# Patient Record
Sex: Male | Born: 1945
Health system: Southern US, Community
[De-identification: ages and names within clinical notes are randomized; demographics above are authoritative.]

## PROBLEM LIST (undated history)

## (undated) DIAGNOSIS — M1990 Unspecified osteoarthritis, unspecified site: Secondary | ICD-10-CM

## (undated) DIAGNOSIS — E1149 Type 2 diabetes mellitus with other diabetic neurological complication: Secondary | ICD-10-CM

## (undated) DIAGNOSIS — R931 Abnormal findings on diagnostic imaging of heart and coronary circulation: Secondary | ICD-10-CM

## (undated) DIAGNOSIS — Z8719 Personal history of other diseases of the digestive system: Secondary | ICD-10-CM

## (undated) DIAGNOSIS — T466X5A Adverse effect of antihyperlipidemic and antiarteriosclerotic drugs, initial encounter: Secondary | ICD-10-CM

## (undated) DIAGNOSIS — E119 Type 2 diabetes mellitus without complications: Secondary | ICD-10-CM

## (undated) DIAGNOSIS — T4145XA Adverse effect of unspecified anesthetic, initial encounter: Secondary | ICD-10-CM

## (undated) DIAGNOSIS — E7800 Pure hypercholesterolemia, unspecified: Secondary | ICD-10-CM

## (undated) DIAGNOSIS — M47812 Spondylosis without myelopathy or radiculopathy, cervical region: Secondary | ICD-10-CM

## (undated) DIAGNOSIS — E890 Postprocedural hypothyroidism: Secondary | ICD-10-CM

## (undated) DIAGNOSIS — M1711 Unilateral primary osteoarthritis, right knee: Secondary | ICD-10-CM

## (undated) DIAGNOSIS — T8859XA Other complications of anesthesia, initial encounter: Secondary | ICD-10-CM

## (undated) DIAGNOSIS — R0602 Shortness of breath: Secondary | ICD-10-CM

## (undated) DIAGNOSIS — R3912 Poor urinary stream: Secondary | ICD-10-CM

## (undated) DIAGNOSIS — R519 Headache, unspecified: Secondary | ICD-10-CM

## (undated) DIAGNOSIS — F418 Other specified anxiety disorders: Secondary | ICD-10-CM

## (undated) DIAGNOSIS — N3946 Mixed incontinence: Secondary | ICD-10-CM

## (undated) DIAGNOSIS — E349 Endocrine disorder, unspecified: Secondary | ICD-10-CM

## (undated) DIAGNOSIS — G7000 Myasthenia gravis without (acute) exacerbation: Secondary | ICD-10-CM

## (undated) DIAGNOSIS — I700 Atherosclerosis of aorta: Secondary | ICD-10-CM

## (undated) DIAGNOSIS — Z8546 Personal history of malignant neoplasm of prostate: Secondary | ICD-10-CM

## (undated) DIAGNOSIS — E785 Hyperlipidemia, unspecified: Secondary | ICD-10-CM

## (undated) DIAGNOSIS — F40240 Claustrophobia: Secondary | ICD-10-CM

## (undated) DIAGNOSIS — G4733 Obstructive sleep apnea (adult) (pediatric): Secondary | ICD-10-CM

## (undated) DIAGNOSIS — C61 Malignant neoplasm of prostate: Secondary | ICD-10-CM

## (undated) DIAGNOSIS — K219 Gastro-esophageal reflux disease without esophagitis: Secondary | ICD-10-CM

## (undated) DIAGNOSIS — Z789 Other specified health status: Secondary | ICD-10-CM

## (undated) DIAGNOSIS — I10 Essential (primary) hypertension: Secondary | ICD-10-CM

## (undated) DIAGNOSIS — Z8639 Personal history of other endocrine, nutritional and metabolic disease: Secondary | ICD-10-CM

## (undated) DIAGNOSIS — I2510 Atherosclerotic heart disease of native coronary artery without angina pectoris: Secondary | ICD-10-CM

## (undated) DIAGNOSIS — N320 Bladder-neck obstruction: Secondary | ICD-10-CM

## (undated) HISTORY — DX: Pure hypercholesterolemia, unspecified: E78.00

## (undated) HISTORY — DX: Spondylosis without myelopathy or radiculopathy, cervical region: M47.812

## (undated) HISTORY — DX: Other specified anxiety disorders: F41.8

## (undated) HISTORY — DX: Adverse effect of antihyperlipidemic and antiarteriosclerotic drugs, initial encounter: T46.6X5A

## (undated) HISTORY — DX: Type 2 diabetes mellitus with other diabetic neurological complication: E11.49

## (undated) HISTORY — DX: Mixed incontinence: N39.46

## (undated) HISTORY — DX: Abnormal findings on diagnostic imaging of heart and coronary circulation: R93.1

## (undated) HISTORY — DX: Atherosclerosis of aorta: I70.0

## (undated) HISTORY — DX: Endocrine disorder, unspecified: E34.9

## (undated) HISTORY — DX: Malignant neoplasm of prostate: C61

## (undated) HISTORY — DX: Atherosclerotic heart disease of native coronary artery without angina pectoris: I25.10

## (undated) HISTORY — DX: Unilateral primary osteoarthritis, right knee: M17.11

## (undated) HISTORY — DX: Other specified health status: Z78.9

---

## 1988-09-04 HISTORY — PX: HEMORRHOID SURGERY: SHX153

## 1997-11-04 ENCOUNTER — Ambulatory Visit (HOSPITAL_COMMUNITY): Admission: RE | Admit: 1997-11-04 | Discharge: 1997-11-04 | Payer: Self-pay | Admitting: General Surgery

## 1998-06-23 ENCOUNTER — Inpatient Hospital Stay (HOSPITAL_COMMUNITY): Admission: AD | Admit: 1998-06-23 | Discharge: 1998-06-26 | Payer: Self-pay | Admitting: Internal Medicine

## 1998-06-26 ENCOUNTER — Encounter: Payer: Self-pay | Admitting: Internal Medicine

## 1998-12-17 ENCOUNTER — Ambulatory Visit (HOSPITAL_COMMUNITY): Admission: RE | Admit: 1998-12-17 | Discharge: 1998-12-17 | Payer: Self-pay | Admitting: Gastroenterology

## 2000-08-12 ENCOUNTER — Encounter: Admission: RE | Admit: 2000-08-12 | Discharge: 2000-08-12 | Payer: Self-pay | Admitting: Family Medicine

## 2000-08-12 ENCOUNTER — Encounter: Payer: Self-pay | Admitting: Family Medicine

## 2003-02-05 ENCOUNTER — Encounter: Admission: RE | Admit: 2003-02-05 | Discharge: 2003-02-05 | Payer: Self-pay | Admitting: Gastroenterology

## 2003-02-13 ENCOUNTER — Ambulatory Visit (HOSPITAL_COMMUNITY): Admission: RE | Admit: 2003-02-13 | Discharge: 2003-02-13 | Payer: Self-pay | Admitting: Gastroenterology

## 2003-05-24 ENCOUNTER — Encounter (INDEPENDENT_AMBULATORY_CARE_PROVIDER_SITE_OTHER): Payer: Self-pay | Admitting: Specialist

## 2003-05-24 ENCOUNTER — Observation Stay (HOSPITAL_COMMUNITY): Admission: RE | Admit: 2003-05-24 | Discharge: 2003-05-25 | Payer: Self-pay | Admitting: Surgery

## 2003-05-24 HISTORY — PX: TOTAL THYROIDECTOMY: SHX2547

## 2005-06-24 ENCOUNTER — Emergency Department (HOSPITAL_COMMUNITY): Admission: EM | Admit: 2005-06-24 | Discharge: 2005-06-24 | Payer: Self-pay | Admitting: Emergency Medicine

## 2010-05-22 NOTE — Op Note (Signed)
NAME:  Mario Burns, Mario Burns                         ACCOUNT NO.:  [id number]   MEDICAL RECORD NO.:  [id number]                   PATIENT TYPE:  AMB   LOCATION:  ENDO                                 FACILITY:  [facility]   PHYSICIAN:  Petra Kuba, M.D.                 DATE OF BIRTH:  06-28-1945   DATE OF PROCEDURE:  02/13/2003  DATE OF DISCHARGE:                                 OPERATIVE REPORT   PROCEDURE:  Esophagogastroduodenoscopy.   INDICATIONS FOR PROCEDURE:  Increased upper tract symptoms, atypical chest  pain.  Consent was signed after risks, benefits, methods, and options were  thoroughly discussed in the office before any premeds given.   ADDITIONAL MEDICATIONS USED:  Demerol 10, Versed 2.   PROCEDURE:  The video endoscope was inserted by direct vision.  The proximal  and mid esophagus was normal.  In the distal esophagus there was a  moderately large hiatal hernia.  The scope passed easily into the stomach.  Some fluid was suctioned.  It was advanced through a normal antrum, normal  pylorus, into a normal duodenum bulb and around the C-loop to the second  portion of the duodenum.  No abnormalities were seen.  A good look back at  the bulb showed normal appearance.  The scope was withdrawn back in the  stomach and retroflexed high in the cardia, the hiatal hernia was confirmed.  The fungus, angularis, lesser and greater curve were all normal on retroflex  visualization. Straight visualization did not reveal any additional  findings.  The air was suctioned, the scope was slowly withdrawn.  Again, a  good look at the esophagus was normal.  The scope was removed.  The patient  tolerated the procedure well.  There was no obvious complications.   ENDOSCOPIC DIAGNOSIS:  1. Moderately large hiatal hernia.  2. Otherwise, normal EGD.   PLAN:  Go ahead and get a TSH and free T4 to evaluate his goiter.  Consider  fixing his hiatal hernia and possibly getting a barium swallow and  thyroidectomy as he is having dysphagia from his goiter.  I will be happy to  see back p.r.n. or in two months pending thyroid results.  May need surgical  consult versus endocrine consult.                                               Petra Kuba, M.D.    MEM/MEDQ  D:  02/13/2003  T:  02/13/2003  Job:  [id number]

## 2010-05-22 NOTE — Op Note (Signed)
NAME:  Mario Burns, Mario Burns                         ACCOUNT NO.:  [id number]   MEDICAL RECORD NO.:  [id number]                   PATIENT TYPE:  INP   LOCATION:  0347                                 FACILITY:  [facility]   PHYSICIAN:  Velora Heckler, M.D.                DATE OF BIRTH:  1946/01/02   DATE OF PROCEDURE:  05/24/2003  DATE OF DISCHARGE:                                 OPERATIVE REPORT   PREOPERATIVE DIAGNOSIS:  Multinodular goiter with compressive symptoms.   POSTOPERATIVE DIAGNOSIS:  Multinodular goiter with compressive symptoms.   PROCEDURE:  Total thyroidectomy.   SURGEON:  Velora Heckler, M.D.   ASSISTANT:  Lebron Conners, M.D.   ANESTHESIA:  General.   ESTIMATED BLOOD LOSS:  Less than 100 cc.   PREPARATION:  Betadine.   COMPLICATIONS:  None.   INDICATIONS:  The patient is a 65 year old white male from [place], [place]; presents with thyroid goiter.  He has developed dysphagia and  pressure of the upper chest.  Chest x-ray showed a significant goiter at the  thoracic outlet.  The patient was seen by Dr. Romeo Apple __________ at [place] at  [facility].  A CT scan of the chest demonstrated a very large thyroid  goiter with substernal extension.  He now comes to surgery for  thyroidectomy.   DESCRIPTION OF PROCEDURE:  The procedure is done in OR #11 at the [place] at  [facility].  The patient is brought to the operating room and  placed in a supine position on the operating room table.  Following the  administration of general anesthesia, the patient is prepped and draped in  the usual strict aseptic fashion.  After ascertaining an adequate level of  anesthesia had been obtained, a Kocher incision was made with a #10 blade.  Dissection was carried down through subcutaneous tissues and platysma.  External jugular veins are divided between hemostats and ligated with 2-0  silk ties.  Platysma is elevated cephalad and caudad from the thyroid notch  to the  sternal notch.  A May-Horner self-retaining retractor is placed for  exposure.  Strap muscles are incised in the midline and dissection is  carried down into the thyroid gland.  Dissection is begun on the left side  of the neck.  The left thyroid lobe was markedly enlarged; it extends  posteriorly to the cervical fascia.  It encircles the trachea and the  esophagus, and extends into the space behind the esophagus.  Middle thyroid  vein is divided between medium Ligaclips.  Inferior pole was mobilized and  venous structures are divided between hemostats and ligated with 2-0 silk  ties.  The superior pole was gently dissected out.  The superior pole  vessels are ligated in continuity with 2-0 silk ties and medium Ligaclips,  and divided.  The gland is rolled anteriorly.  The branches of the inferior  thyroid artery are divided between  small Ligaclips.  Parathyroid tissue is  identified and preserved.  The gland is rolled medially.  Ligament of Allyson Sabal  is transected and the gland is rolled up and onto the anterior trachea.  Dissection is moderately difficult due to the large size of the gland,  difficulty visualizing significant structures near the area of the recurrent  laryngeal nerve.  There is a small pyramidal lobe, which is resected with  the electrocautery.  Good hemostasis is noted and a pack is placed in the  left neck.   Next, we turned our attention to the right side.  Again, the right lobe was  markedly enlarged, although not quite to the degree seen on the left side.  Strap muscles are reflected laterally.  Middle thyroid vein is divided  between medium Ligaclips.  Inferior venous tributaries are divided between  hemostats and ligated with 2-0 silk ties.  The superior pole is somewhat  bifid.  The vessels are again ligated in continuity with 2-0 silk ties and  medium Ligaclips, then divided.  Pole is mobilized completely from the space  behind the hypopharynx.  Gland is gently  rolled medially.  Branches of the  inferior thyroid artery are divided between small Ligaclips.  There is a  large tubercle of Zucker-Kandel.  This is gently dissected out.  Parathyroid  tissue is preserved.  Dissection is continued on the surface of the thyroid  capsule.  The gland is rolled medially.  Further venous tributaries are  divided between small Ligaclips.  The ligament of Allyson Sabal is transected with  the electrocautery and the gland is rolled anteriorly.  The gland is then  excised off the anterior surface of the trachea with the electrocautery used  for hemostasis.  The entire thyroid is excised en bloc.  A suture is used to  mark the left superior pole.  Specimen is submitted to pathology for review.  The neck is irrigated with warm saline and hemostasis is obtained with the  electrocautery and small Ligaclips.  Surgicel is placed over the area of the  recurrent laryngeal nerves bilaterally.  Strap muscles were reapproximated  in the midline with interrupted 3-0 Vicryl sutures.  Platysma is closed with  interrupted 3-0 Vicryl sutures.  The skin is closed with a running 4-0  Vicryl subcuticular suture.  The wound is washed and dried and Benzoin and  Steri-Strips are applied.  Sterile dressings are applied.  The patient is  awakened from anesthesia and brought to the recovery room in stable  condition.  The patient tolerated the procedure well.                                               Velora Heckler, M.D.    TMG/MEDQ  D:  05/24/2003  T:  05/24/2003  Job:  [id number]   cc:   Dr. Wonda Cerise; [place] [facility], M.D.  [address]  [place]  [place] [address]  Fax: [phone]   Lianne Bushy, M.D.  [address]  [place]  [place] [address]  Fax: [phone]

## 2010-05-22 NOTE — Op Note (Signed)
NAME:  Mario Burns, KUNZ                         ACCOUNT NO.:  [id number]   MEDICAL RECORD NO.:  [id number]                   PATIENT TYPE:  AMB   LOCATION:  ENDO                                 FACILITY:  [facility]   PHYSICIAN:  Petra Kuba, M.D.                 DATE OF BIRTH:  [birth year]   DATE OF PROCEDURE:  02/13/2003  DATE OF DISCHARGE:                                 OPERATIVE REPORT   PROCEDURE:  Colonoscopy.   INDICATION:  Family history of colon cancer, personal history of colon  polyps, due for repeat screening.  Consent was signed after risks, benefits,  methods, and options thoroughly discussed in the office.   MEDICINES USED:  1. Demerol 50.  2. Versed 5.   DESCRIPTION OF PROCEDURE:  Rectal inspection was pertinent for external  hemorrhoids.  Digital exam was negative.  Video colonoscope was inserted,  easily advanced around the colon to the cecum.  This did require abdominal  pressure but no position changes.  No obvious abnormality was seen on  insertion.  The cecum was identified by the appendiceal orifice and the  ileocecal valve.  Prep was adequate.  There was some liquid stool that  required washing and suctioning.  On slow withdrawal through the colon, no  polyps, tumors, masses, or other abnormalities were seen as we slowly  withdrew back to the rectum.  Anorectal pull-through and retroflexion  confirmed some small hemorrhoids.  The scope was straightened and readvanced  a short ways up the left side of the colon; air was suctioned, scope  removed.  The patient tolerated the procedure well.  There was no obvious  immediate complication.   ENDOSCOPIC DIAGNOSES:  1. Internal-external hemorrhoids.  2. Otherwise, within normal limits to the cecum.   PLAN:  1. Yearly rectals and guaiacs per Dr. Purnell Shoemaker.  2. Continue work-up with an EGD.  3. Repeat colon screening in 5 years.                                               Petra Kuba, M.D.    MEM/MEDQ  D:   02/13/2003  T:  02/13/2003  Job:  [id number]   cc:   Lianne Bushy, M.D.  [address]  [place]  [place] [address]  Fax: [phone]

## 2010-07-01 ENCOUNTER — Other Ambulatory Visit: Payer: Self-pay | Admitting: Gastroenterology

## 2011-03-01 ENCOUNTER — Other Ambulatory Visit: Payer: Self-pay | Admitting: Neurology

## 2011-03-05 ENCOUNTER — Ambulatory Visit
Admission: RE | Admit: 2011-03-05 | Discharge: 2011-03-05 | Disposition: A | Discharge status: Home / Self Care | Payer: Medicare Other | Source: Ambulatory Visit | Attending: Neurology | Admitting: Neurology

## 2011-03-05 MED ORDER — IOHEXOL 300 MG/ML  SOLN
75.0000 mL | Freq: Once | INTRAMUSCULAR | Status: AC | PRN
  Administered 2011-03-05: 75 mL via INTRAVENOUS

## 2011-10-13 ENCOUNTER — Other Ambulatory Visit: Payer: Self-pay | Admitting: Family Medicine

## 2011-10-13 DIAGNOSIS — R109 Unspecified abdominal pain: Secondary | ICD-10-CM

## 2011-10-18 ENCOUNTER — Ambulatory Visit
Admission: RE | Admit: 2011-10-18 | Discharge: 2011-10-18 | Disposition: A | Discharge status: Home / Self Care | Payer: Medicare Other | Source: Ambulatory Visit | Attending: Family Medicine | Admitting: Family Medicine

## 2011-10-18 DIAGNOSIS — R109 Unspecified abdominal pain: Secondary | ICD-10-CM

## 2011-10-18 MED ORDER — IOHEXOL 300 MG/ML  SOLN
100.0000 mL | Freq: Once | INTRAMUSCULAR | Status: AC | PRN
  Administered 2011-10-18: 100 mL via INTRAVENOUS

## 2011-10-21 ENCOUNTER — Other Ambulatory Visit: Payer: Self-pay | Admitting: Urology

## 2011-12-08 ENCOUNTER — Encounter (HOSPITAL_COMMUNITY): Payer: Self-pay | Admitting: Pharmacy Technician

## 2011-12-10 ENCOUNTER — Encounter (HOSPITAL_COMMUNITY): Payer: Self-pay

## 2011-12-10 ENCOUNTER — Encounter (HOSPITAL_COMMUNITY)
Admission: RE | Admit: 2011-12-10 | Discharge: 2011-12-10 | Disposition: A | Discharge status: Home / Self Care | Payer: Medicare Other | Source: Ambulatory Visit | Attending: Urology | Admitting: Urology

## 2011-12-10 HISTORY — DX: Essential (primary) hypertension: I10

## 2011-12-10 HISTORY — DX: Gastro-esophageal reflux disease without esophagitis: K21.9

## 2011-12-10 HISTORY — DX: Unspecified osteoarthritis, unspecified site: M19.90

## 2011-12-10 HISTORY — DX: Personal history of other diseases of the digestive system: Z87.19

## 2011-12-10 HISTORY — DX: Shortness of breath: R06.02

## 2011-12-10 LAB — CBC
HCT: 43.4 % (ref 39.0–52.0)
Hemoglobin: 15.2 g/dL (ref 13.0–17.0)
MCHC: 35 g/dL (ref 30.0–36.0)

## 2011-12-10 LAB — BASIC METABOLIC PANEL
BUN: 14 mg/dL (ref 6–23)
CO2: 24 mEq/L (ref 19–32)
Calcium: 9.5 mg/dL (ref 8.4–10.5)
Creatinine, Ser: 0.68 mg/dL (ref 0.50–1.35)
GFR calc non Af Amer: >90 mL/min (ref >90)
Potassium: 4.1 mEq/L (ref 3.5–5.1)

## 2011-12-10 NOTE — Patient Instructions (Signed)
YOUR SURGERY IS SCHEDULED AT [facility]  ON:  Wednesday  12/11  REPORT TO [place] SHORT STAY CENTER AT: 6:30 AM      PHONE # FOR SHORT STAY IS [phone] FOLLOW BOWEL PREP INSTRUCTIONS GIVEN BY DR. Ellin Goodie OFFICE DAY BEFORE SURGERY.  DO NOT EAT OR DRINK ANYTHING AFTER MIDNIGHT THE NIGHT BEFORE YOUR SURGERY.  YOU MAY BRUSH YOUR TEETH, RINSE OUT YOUR MOUTH--BUT NO WATER, NO FOOD, NO CHEWING GUM, NO MINTS, NO CANDIES, NO CHEWING TOBACCO.  PLEASE TAKE THE FOLLOWING MEDICATIONS THE AM OF YOUR SURGERY WITH A FEW SIPS OF WATER:  LEVOTHYROXIN  IF YOU USE INHALERS--USE YOUR INHALERS THE AM OF YOUR SURGERY AND BRING INHALERS TO THE HOSPITAL -TAKE TO SURGERY.    IF YOU ARE DIABETIC:  DO NOT TAKE ANY DIABETIC MEDICATIONS THE AM OF YOUR SURGERY.  IF YOU TAKE INSULIN IN THE EVENINGS--PLEASE ONLY TAKE 1/2 NORMAL EVENING DOSE THE NIGHT BEFORE YOUR SURGERY.  NO INSULIN THE AM OF YOUR SURGERY.  IF YOU HAVE SLEEP APNEA AND USE CPAP OR BIPAP--PLEASE BRING THE MASK AND THE TUBING.  DO NOT BRING YOUR MACHINE.  DO NOT BRING VALUABLES, MONEY, CREDIT CARDS.  DO NOT WEAR JEWELRY, MAKE-UP, NAIL POLISH AND NO METAL PINS OR CLIPS IN YOUR HAIR. CONTACT LENS, DENTURES / PARTIALS, GLASSES SHOULD NOT BE WORN TO SURGERY AND IN MOST CASES-HEARING AIDS WILL NEED TO BE REMOVED.  BRING YOUR GLASSES CASE, ANY EQUIPMENT NEEDED FOR YOUR CONTACT LENS. FOR PATIENTS ADMITTED TO THE HOSPITAL--CHECK OUT TIME THE DAY OF DISCHARGE IS 11:00 AM.  ALL INPATIENT ROOMS ARE PRIVATE - WITH BATHROOM, TELEPHONE, TELEVISION AND WIFI INTERNET.  IF YOU ARE BEING DISCHARGED THE SAME DAY OF YOUR SURGERY--YOU CAN NOT DRIVE YOURSELF HOME--AND SHOULD NOT GO HOME ALONE BY TAXI OR BUS.  NO DRIVING OR OPERATING MACHINERY FOR 24 HOURS FOLLOWING ANESTHESIA / PAIN MEDICATIONS.  PLEASE MAKE ARRANGEMENTS FOR SOMEONE TO BE WITH YOU AT HOME THE FIRST 24 HOURS AFTER SURGERY. RESPONSIBLE DRIVER'S NAME___________________________                       PHONE #   _______________________                               PLEASE READ OVER ANY  FACT SHEETS THAT YOU WERE GIVEN: MRSA INFORMATION, BLOOD TRANSFUSION INFORMATION, INCENTIVE SPIROMETER INFORMATION. FAILURE TO FOLLOW THESE INSTRUCTIONS MAY RESULT IN THE CANCELLATION OF YOUR SURGERY.   PATIENT SIGNATURE_________________________________

## 2011-12-10 NOTE — Pre-Procedure Instructions (Signed)
PREOP CBC, BMET, EKG WERE DONE TODAY- PREOP AT [facility] AS PER ANESTHESIOLOGIST'S GUIDELINES.  PT WANTS T/S DRAWN THE DAY OF HIS SURGERY.  PT HAS CHEST CT REPORT IN EPIC FROM 03/05/11. DR. Renold Don NOTIFIED PT HAS MYASTHENIA GRAVIS DX IN FEB 2013--IS NOT ON ANY MEDICATIONS FOR MG AND HAS NOT SEEN NEUROLOGIST SINCE FEB 2013 -HIS CHEWING AND SWALLOWING PROBLEMS HAVE RETURNED-THOUGH NOT AS BAD AS PREVIOUS.   DR. Renold Don STATES PT NEEDS TO SEE NEUROLOGIST FOR CLEARANCE BEFORE SURGERY.  SELITA AT DR. Ellin Goodie OFFICE NOTIFIED -MESSAGE LEFT ON HER ANSWERING MACHINE.

## 2011-12-13 NOTE — Pre-Procedure Instructions (Signed)
DR. Ellin Goodie OFFICE FAXED SURGICAL CLEARANCE FROM DR. KAnne Hahn AND OFFICE NOTE 12/10/11. CLEARANCE AND NOTE PLACED ON PT'S CHART.

## 2011-12-14 NOTE — H&P (Signed)
Reason For Visit      Patient is here today for robotic-assisted laparoscopic radical retropubic prostatectomy. He will be admitted hopefully for routine postoperative care.  History of Present Illness         Referred from Dr. Burnell Blanks for an elevated PSA of 4.3-4.7 range. These were drawn in July of 2013. PSA 2 was 10.5%. PSA in February of 2012 was 3.97.  Patient does have apparently mild documented hypogonadism.    Mario Burns  has had some voiding problems since his early forties. He was under the care of a urologist in [place] but has not been there for 20-25 years.  Currently his AUA symptom score is 32 with a bothersome score of 5.  He has a considerable obstructive as well as irritative symptoms.  The patient does have a history of prior urinary retention after  surgery.  He does have a significant family history of prostate cancer with a father who had metastatic prostate cancer and also an uncle with that disease.    Mario Burns underwent transrectal ultrasound of the prostate with biopsy in September of 2013. Patient's prostate volume was felt to be approximately 41 cubic centimeters. All left-sided biopsies were negative. On the right side all 6 cores were positive for a Gleason 3+3 equals 6 adenocarcinoma. Core involvement ranged from 10-50%.  He has not tried the Rapaflo samples.  The patient had a recent CT of the abdomen and pelvis with and without contrast ordered by his primary care physician due to some nonspecific abdominal pain. No concerning findings were noted. Rapaflo has not helped his voiding at all. AUA score 32/ 5. SHIM 5/25.    Past Medical History Problems  1. History of  Arthritis V13.4 2. History of  Diabetes Mellitus 250.00 3. History of  Gastric Ulcer 531.90 4. History of  Heartburn 787.1 5. History of  Hypercholesterolemia 272.0 6. History of  Hypertension 401.9 7. History of  Sleep Apnea 780.57  Surgical History Problems  1. History of  Hernia  Repair 2. History of  Thyroid Surgery Total Thyroidectomy  Current Meds 1. Benicar 20 MG Oral Tablet; Therapy: (Recorded:23Aug2013) to 2. Glimepiride 4 MG Oral Tablet; Therapy: 18Mar2013 to 3. Levofloxacin 500 MG Oral Tablet; 1 po q day beginning the day prior to biopsy; Therapy:  23Aug2013 to (Last Rx:23Aug2013) 4. Levothyroxine Sodium 125 MCG Oral Tablet; Therapy: 22Mar2013 to 5. PriLOSEC 20 MG Oral Capsule Delayed Release; Therapy: (Recorded:23Aug2013) to 6. Rapaflo 8 MG Oral Capsule; TAKE 1 CAPSULE Daily; Therapy: 23Aug2013 to  (Evaluate:21Dec2013)  Requested for: 23Aug2013; Last Rx:23Aug2013  Allergies Medication  1. Sulfa Drugs  Family History Problems  1. Paternal history of  Death In The Family Father 37yrs, PCA 2. Maternal history of  Family Health Status - Mother's Age 18yrs 3. Family history of  Family Health Status Number Of Children 1 son 4. Fraternal history of  Kidney Cancer V16.51 5. Paternal history of  Prostate Cancer V16.42 6. Paternal uncle's history of  Prostate Cancer V16.42  Social History Problems  1. Caffeine Use 1-2 qd 2. Marital History - Currently Married 3. Never A Smoker 4. Occupation: self-employed Denied  5. History of  Alcohol Use 6. History of  Tobacco Use  Review of Systems Genitourinary, constitutional, skin, eye, otolaryngeal, hematologic/lymphatic, cardiovascular, pulmonary, endocrine, musculoskeletal, gastrointestinal, neurological and psychiatric system(s) were reviewed and pertinent findings if present are noted.  Genitourinary: urinary frequency, feelings of urinary urgency, dysuria, nocturia, incontinence, difficulty starting the urinary stream, weak urinary stream, incomplete  emptying of bladder and erectile dysfunction, but no hematuria.  Gastrointestinal: abdominal pain and heartburn.  Constitutional: feeling tired (fatigue).  Eyes: blurred vision.  Cardiovascular: leg swelling.  Respiratory: shortness of breath and cough.   Endocrine: polydipsia.  Musculoskeletal: back pain and joint pain.  Neurological: headache and dizziness.  Psychiatric: anxiety.    Vitals Blood Pressure: 117 / 75 Temperature: 97.7 F Heart Rate: 79  Physical Exam Constitutional: Well nourished and well developed . No acute distress.  ENT:. The ears and nose are normal in appearance.  Neck: The appearance of the neck is normal and no neck mass is present.  Pulmonary: No respiratory distress and normal respiratory rhythm and effort.  Cardiovascular: Heart rate and rhythm are normal . No peripheral edema.  Abdomen: The abdomen is soft and nontender. No masses are palpated. No CVA tenderness. No hernias are palpable. No hepatosplenomegaly noted.  Rectal: Rectal exam demonstrates normal sphincter tone, no tenderness and no masses. Estimated prostate size is 2+. The prostate has no nodularity, is indurated involving the right, apex, mid aspect of the prostate which appears to be confined within the prostate capsule and is not tender. The left seminal vesicle is nonpalpable. The right seminal vesicle is nonpalpable. The perineum is normal on inspection.  Genitourinary: Examination of the penis demonstrates no discharge, no masses, no lesions and a normal meatus. The scrotum is without lesions. The right epididymis is palpably normal and non-tender. The left epididymis is palpably normal and non-tender. The right testis is non-tender and without masses. The left testis is non-tender and without masses.  Lymphatics: The femoral and inguinal nodes are not enlarged or tender.  Skin: Normal skin turgor, no visible rash and no visible skin lesions.  Neuro/Psych:. Mood and affect are appropriate.    Assessment Assessed  1. Benign Prostatic Hypertrophy With Urinary Obstruction 600.01 2. Prostate Cancer 185  Plan Prostate Cancer (185)  1. Follow-up Schedule Surgery Office  Follow-up  Requested for: 15Oct2013  Discussion/Summary  The patient was  counseled about the natural history of prostate cancer and the standard treatment options that are available for prostate cancer. It was explained to him how his age and life expectancy, clinical stage, Gleason score, and PSA affect his prognosis, the decision to proceed with additional staging studies, as well as how that information influences recommended treatment strategies. We discussed the roles for active surveillance, radiation therapy, surgical therapy, androgen deprivation, as well as ablative therapy options for the treatment of prostate cancer as appropriate to his individual cancer situation. We discussed the risks and benefits of these options with regard to their impact on cancer control and also in terms of potential adverse events, complications, and impact on quiality of life particularly related to urinary, bowel, and sexual function. The patient was encouraged to ask questions throughout the discussion today and all questions were answered to his stated satisfaction. In addition, the patient was provided with and/or directed to appropriate resources and literature for further education about prostate cancer and treatment options.   We discussed surgical therapy for prostate cancer including the different available surgical approaches. We discussed, in detail, the risks and expectations of surgery with regard to cancer control, urinary control, and erectile function as well as the expected postoperative recovery process. The risks, potential complications/adverse events of radical prostatectomy as well as alternative options were explained to the patient.   We discussed surgical therapy for prostate cancer including the different available surgical approaches. We discussed, in detail, the risks and  expectations of surgery with regard to cancer control, urinary control, and erectile function as well as the expected postoperative recovery process. Additional risks of surgery including but not  limited to bleeding, infection, hernia formation, nerve damage, lymphocele formation, bowel/rectal injury potentially necessitating colostomy, damage to the urinary tract resulting in urine leakage, urethral stricture, and the cardiopulmonary risks such as myocardial infarction, stroke, death, venothromboembolism, etc. were explained. The risk of open surgical conversion for robotic/laparoscopic prostatectomy was also discussed.   45 minutes were spent in face to face consultation with patient today.    Amendment  Mario Burns has a lower risk clinical stage T1c prostate cancer. PSA is well less than 10 and fortunately he has Gleason 6 disease. On the other hand, the volume of cancer is certainly clinically significant with all biopsies on the right side positive. I definitely feel that he should have active intervention, given this. He really has severe obstructive and irritative voiding symptoms, and therefore, I feel he is a poor candidate for radiation therapy, especially seed implantation. It is certainly possible that he would get through external beam but would be quite concerned that his voiding would worsen dramatically. The patient also has fairly severe pretreatment erectile dysfunction. For those reasons, I do think that surgery would be his best option. We would have the advantage of pathologic staging along with hopefully, improvement in his voiding status if indeed there is a significant obstructive component and certainly no worsening of bladder overactivity like we might see with radiation therapy. We talked in detail about the pros and cons of surgery and potential side effects. He is interested in proceeding with that. He understands that I have my next opening in the 6-8 week time frame.

## 2011-12-15 ENCOUNTER — Encounter (HOSPITAL_COMMUNITY)
Admission: RE | Disposition: A | Discharge status: Home / Self Care | Payer: Self-pay | Source: Ambulatory Visit | Attending: Urology

## 2011-12-15 ENCOUNTER — Encounter (HOSPITAL_COMMUNITY): Payer: Self-pay | Admitting: Anesthesiology

## 2011-12-15 ENCOUNTER — Observation Stay (HOSPITAL_COMMUNITY)
Admission: RE | Admit: 2011-12-15 | Discharge: 2011-12-17 | Disposition: A | Discharge status: Home / Self Care | Payer: Medicare Other | Source: Ambulatory Visit | Attending: Urology | Admitting: Urology

## 2011-12-15 ENCOUNTER — Encounter (HOSPITAL_COMMUNITY): Payer: Self-pay | Admitting: *Deleted

## 2011-12-15 ENCOUNTER — Ambulatory Visit (HOSPITAL_COMMUNITY): Payer: Medicare Other | Admitting: Anesthesiology

## 2011-12-15 DIAGNOSIS — N401 Enlarged prostate with lower urinary tract symptoms: Secondary | ICD-10-CM | POA: Insufficient documentation

## 2011-12-15 DIAGNOSIS — I10 Essential (primary) hypertension: Secondary | ICD-10-CM | POA: Insufficient documentation

## 2011-12-15 DIAGNOSIS — Z01810 Encounter for preprocedural cardiovascular examination: Secondary | ICD-10-CM | POA: Insufficient documentation

## 2011-12-15 DIAGNOSIS — E119 Type 2 diabetes mellitus without complications: Secondary | ICD-10-CM

## 2011-12-15 DIAGNOSIS — C61 Malignant neoplasm of prostate: Principal | ICD-10-CM | POA: Insufficient documentation

## 2011-12-15 DIAGNOSIS — N138 Other obstructive and reflux uropathy: Secondary | ICD-10-CM | POA: Insufficient documentation

## 2011-12-15 DIAGNOSIS — E7800 Pure hypercholesterolemia, unspecified: Secondary | ICD-10-CM | POA: Insufficient documentation

## 2011-12-15 DIAGNOSIS — Z79899 Other long term (current) drug therapy: Secondary | ICD-10-CM | POA: Insufficient documentation

## 2011-12-15 DIAGNOSIS — G4730 Sleep apnea, unspecified: Secondary | ICD-10-CM | POA: Insufficient documentation

## 2011-12-15 HISTORY — PX: ROBOT ASSISTED LAPAROSCOPIC RADICAL PROSTATECTOMY: SHX5141

## 2011-12-15 LAB — GLUCOSE, CAPILLARY: Glucose-Capillary: 186 mg/dL — ABNORMAL HIGH (ref 70–99)

## 2011-12-15 LAB — HEMOGLOBIN AND HEMATOCRIT, BLOOD
HCT: 39.1 % (ref 39.0–52.0)
Hemoglobin: 13.6 g/dL (ref 13.0–17.0)

## 2011-12-15 LAB — TYPE AND SCREEN: ABO/RH(D): A POS

## 2011-12-15 LAB — ABO/RH: ABO/RH(D): A POS

## 2011-12-15 SURGERY — ROBOTIC ASSISTED LAPAROSCOPIC RADICAL PROSTATECTOMY
Anesthesia: General | Wound class: Clean Contaminated

## 2011-12-15 MED ORDER — PANTOPRAZOLE SODIUM 40 MG PO TBEC
40.0000 mg | DELAYED_RELEASE_TABLET | Freq: Every day | ORAL | Status: DC
  Administered 2011-12-15 – 2011-12-17 (×3): 40 mg via ORAL
  Filled 2011-12-15 (×4): qty 1

## 2011-12-15 MED ORDER — HYDROCODONE-ACETAMINOPHEN 5-325 MG PO TABS: 1.0000 | ORAL_TABLET | Freq: Four times a day (QID) | ORAL | Status: DC | PRN

## 2011-12-15 MED ORDER — SODIUM CHLORIDE 0.9 % IV SOLN
INTRAVENOUS | Status: AC
  Filled 2011-12-15: qty 1.5

## 2011-12-15 MED ORDER — ACETAMINOPHEN 10 MG/ML IV SOLN
1000.0000 mg | Freq: Four times a day (QID) | INTRAVENOUS | Status: AC
  Administered 2011-12-15 – 2011-12-16 (×3): 1000 mg via INTRAVENOUS
  Filled 2011-12-15 (×4): qty 100

## 2011-12-15 MED ORDER — GLYCOPYRROLATE 0.2 MG/ML IJ SOLN
INTRAMUSCULAR | Status: DC | PRN
  Administered 2011-12-15: 0.6 mg via INTRAVENOUS

## 2011-12-15 MED ORDER — HEPARIN SODIUM (PORCINE) 1000 UNIT/ML IJ SOLN
INTRAMUSCULAR | Status: AC
  Filled 2011-12-15: qty 1

## 2011-12-15 MED ORDER — HYDROMORPHONE HCL PF 1 MG/ML IJ SOLN
0.2500 mg | INTRAMUSCULAR | Status: DC | PRN
  Administered 2011-12-15 (×2): 0.5 mg via INTRAVENOUS

## 2011-12-15 MED ORDER — LEVOTHYROXINE SODIUM 125 MCG PO TABS
125.0000 ug | ORAL_TABLET | Freq: Every day | ORAL | Status: DC
  Administered 2011-12-16 – 2011-12-17 (×2): 125 ug via ORAL
  Filled 2011-12-15 (×3): qty 1

## 2011-12-15 MED ORDER — EPHEDRINE SULFATE 50 MG/ML IJ SOLN
INTRAMUSCULAR | Status: DC | PRN
  Administered 2011-12-15 (×5): 10 mg via INTRAVENOUS

## 2011-12-15 MED ORDER — INSULIN ASPART 100 UNIT/ML ~~LOC~~ SOLN
0.0000 [IU] | Freq: Every day | SUBCUTANEOUS | Status: DC
  Administered 2011-12-15: 3 [IU] via SUBCUTANEOUS

## 2011-12-15 MED ORDER — LIDOCAINE HCL (CARDIAC) 20 MG/ML IV SOLN
INTRAVENOUS | Status: DC | PRN
  Administered 2011-12-15: 100 mg via INTRAVENOUS

## 2011-12-15 MED ORDER — GLIMEPIRIDE 4 MG PO TABS
4.0000 mg | ORAL_TABLET | Freq: Two times a day (BID) | ORAL | Status: DC
  Administered 2011-12-15 – 2011-12-17 (×4): 4 mg via ORAL
  Filled 2011-12-15 (×6): qty 1

## 2011-12-15 MED ORDER — STERILE WATER FOR IRRIGATION IR SOLN
Status: DC | PRN
  Administered 2011-12-15: 3000 mL

## 2011-12-15 MED ORDER — CIPROFLOXACIN HCL 500 MG PO TABS: 500.0000 mg | ORAL_TABLET | Freq: Two times a day (BID) | ORAL | Status: DC

## 2011-12-15 MED ORDER — SUCCINYLCHOLINE CHLORIDE 20 MG/ML IJ SOLN
INTRAMUSCULAR | Status: DC | PRN
  Administered 2011-12-15: 100 mg via INTRAVENOUS

## 2011-12-15 MED ORDER — HYDROCODONE-ACETAMINOPHEN 5-325 MG PO TABS
1.0000 | ORAL_TABLET | ORAL | Status: DC | PRN
  Administered 2011-12-16: 2 via ORAL
  Administered 2011-12-16: 1 via ORAL
  Administered 2011-12-16 (×2): 2 via ORAL
  Administered 2011-12-17 (×4): 1 via ORAL
  Filled 2011-12-15 (×2): qty 2
  Filled 2011-12-15 (×3): qty 1
  Filled 2011-12-15 (×2): qty 2
  Filled 2011-12-15 (×2): qty 1

## 2011-12-15 MED ORDER — HYDROMORPHONE HCL PF 1 MG/ML IJ SOLN
INTRAMUSCULAR | Status: AC
  Filled 2011-12-15: qty 1

## 2011-12-15 MED ORDER — BUPIVACAINE-EPINEPHRINE PF 0.25-1:200000 % IJ SOLN
INTRAMUSCULAR | Status: AC
  Filled 2011-12-15: qty 30

## 2011-12-15 MED ORDER — LACTATED RINGERS IV SOLN: INTRAVENOUS | Status: DC

## 2011-12-15 MED ORDER — ACETAMINOPHEN 10 MG/ML IV SOLN
INTRAVENOUS | Status: AC
Start: 2011-12-15 — End: 2011-12-15
  Filled 2011-12-15: qty 100

## 2011-12-15 MED ORDER — SODIUM CHLORIDE 0.9 % IV BOLUS (SEPSIS)
1000.0000 mL | Freq: Once | INTRAVENOUS | Status: AC
  Administered 2011-12-15: 1000 mL via INTRAVENOUS

## 2011-12-15 MED ORDER — MIDAZOLAM HCL 5 MG/5ML IJ SOLN
INTRAMUSCULAR | Status: DC | PRN
  Administered 2011-12-15: 2 mg via INTRAVENOUS

## 2011-12-15 MED ORDER — ONDANSETRON HCL 4 MG/2ML IJ SOLN
4.0000 mg | INTRAMUSCULAR | Status: DC | PRN
  Administered 2011-12-16: 4 mg via INTRAVENOUS
  Filled 2011-12-15 (×2): qty 2

## 2011-12-15 MED ORDER — MEPERIDINE HCL 50 MG/ML IJ SOLN: 6.2500 mg | INTRAMUSCULAR | Status: DC | PRN

## 2011-12-15 MED ORDER — ACETAMINOPHEN 10 MG/ML IV SOLN
INTRAVENOUS | Status: DC | PRN
  Administered 2011-12-15: 1000 mg via INTRAVENOUS

## 2011-12-15 MED ORDER — OXYCODONE HCL 5 MG/5ML PO SOLN
5.0000 mg | Freq: Once | ORAL | Status: DC | PRN
  Filled 2011-12-15: qty 5

## 2011-12-15 MED ORDER — INSULIN ASPART 100 UNIT/ML ~~LOC~~ SOLN
0.0000 [IU] | Freq: Three times a day (TID) | SUBCUTANEOUS | Status: DC
  Administered 2011-12-15: 8 [IU] via SUBCUTANEOUS
  Administered 2011-12-16 (×3): 3 [IU] via SUBCUTANEOUS
  Administered 2011-12-17: 5 [IU] via SUBCUTANEOUS

## 2011-12-15 MED ORDER — ACETAMINOPHEN 10 MG/ML IV SOLN: 1000.0000 mg | Freq: Once | INTRAVENOUS | Status: DC | PRN

## 2011-12-15 MED ORDER — LACTATED RINGERS IV SOLN
INTRAVENOUS | Status: DC | PRN
  Administered 2011-12-15 (×3): via INTRAVENOUS

## 2011-12-15 MED ORDER — CISATRACURIUM BESYLATE (PF) 10 MG/5ML IV SOLN
INTRAVENOUS | Status: DC | PRN
  Administered 2011-12-15: 2 mg via INTRAVENOUS
  Administered 2011-12-15: 6 mg via INTRAVENOUS
  Administered 2011-12-15: 10 mg via INTRAVENOUS
  Administered 2011-12-15: 4 mg via INTRAVENOUS

## 2011-12-15 MED ORDER — NEOSTIGMINE METHYLSULFATE 1 MG/ML IJ SOLN
INTRAMUSCULAR | Status: DC | PRN
  Administered 2011-12-15: 4 mg via INTRAVENOUS

## 2011-12-15 MED ORDER — SODIUM CHLORIDE 0.9 % IV SOLN
1.5000 g | INTRAVENOUS | Status: AC
  Administered 2011-12-15: 1.5 g via INTRAVENOUS

## 2011-12-15 MED ORDER — SODIUM CHLORIDE 0.9 % IR SOLN
Status: DC | PRN
  Administered 2011-12-15: 1000 mL via INTRAVESICAL

## 2011-12-15 MED ORDER — PROMETHAZINE HCL 25 MG/ML IJ SOLN: 6.2500 mg | INTRAMUSCULAR | Status: DC | PRN

## 2011-12-15 MED ORDER — LACTATED RINGERS IV SOLN
INTRAVENOUS | Status: DC | PRN
  Administered 2011-12-15: 10:00:00

## 2011-12-15 MED ORDER — SODIUM CHLORIDE 0.45 % IV SOLN
INTRAVENOUS | Status: DC
  Administered 2011-12-15 (×2): via INTRAVENOUS

## 2011-12-15 MED ORDER — SUFENTANIL CITRATE 50 MCG/ML IV SOLN
INTRAVENOUS | Status: DC | PRN
  Administered 2011-12-15: 10 ug via INTRAVENOUS
  Administered 2011-12-15: 5 ug via INTRAVENOUS
  Administered 2011-12-15 (×2): 10 ug via INTRAVENOUS
  Administered 2011-12-15: 5 ug via INTRAVENOUS
  Administered 2011-12-15: 10 ug via INTRAVENOUS

## 2011-12-15 MED ORDER — IRBESARTAN 150 MG PO TABS
150.0000 mg | ORAL_TABLET | Freq: Every day | ORAL | Status: DC
  Administered 2011-12-15 – 2011-12-17 (×3): 150 mg via ORAL
  Filled 2011-12-15 (×3): qty 1

## 2011-12-15 MED ORDER — INDIGOTINDISULFONATE SODIUM 8 MG/ML IJ SOLN
INTRAMUSCULAR | Status: AC
  Filled 2011-12-15: qty 10

## 2011-12-15 MED ORDER — PROPOFOL 10 MG/ML IV BOLUS
INTRAVENOUS | Status: DC | PRN
  Administered 2011-12-15: 200 mg via INTRAVENOUS

## 2011-12-15 MED ORDER — MORPHINE SULFATE 2 MG/ML IJ SOLN
2.0000 mg | INTRAMUSCULAR | Status: DC | PRN
  Administered 2011-12-15 (×2): 4 mg via INTRAVENOUS
  Administered 2011-12-15 – 2011-12-16 (×5): 2 mg via INTRAVENOUS
  Filled 2011-12-15: qty 2
  Filled 2011-12-15 (×2): qty 1
  Filled 2011-12-15: qty 2
  Filled 2011-12-15: qty 1
  Filled 2011-12-15: qty 2

## 2011-12-15 MED ORDER — OXYCODONE HCL 5 MG PO TABS: 5.0000 mg | ORAL_TABLET | Freq: Once | ORAL | Status: DC | PRN

## 2011-12-15 MED ORDER — ONDANSETRON HCL 4 MG/2ML IJ SOLN
INTRAMUSCULAR | Status: DC | PRN
  Administered 2011-12-15: 4 mg via INTRAVENOUS

## 2011-12-15 MED ORDER — INDIGOTINDISULFONATE SODIUM 8 MG/ML IJ SOLN
INTRAMUSCULAR | Status: DC | PRN
  Administered 2011-12-15 (×2): 5 mL via INTRAVENOUS
  Administered 2011-12-15: 2.5 mL via INTRAVENOUS

## 2011-12-15 MED ORDER — BUPIVACAINE-EPINEPHRINE 0.25% -1:200000 IJ SOLN
INTRAMUSCULAR | Status: DC | PRN
  Administered 2011-12-15: 20 mL

## 2011-12-15 SURGICAL SUPPLY — 51 items
APL ESCP 34 STRL LF DISP (HEMOSTASIS) ×1
APPLICATOR SURGIFLO ENDO (HEMOSTASIS) ×2 IMPLANT
CANISTER SUCTION 2500CC (MISCELLANEOUS) ×2 IMPLANT
CATH FOLEY 2WAY SLVR  5CC 20FR (CATHETERS) ×1
CATH FOLEY 2WAY SLVR 18FR 30CC (CATHETERS) ×2 IMPLANT
CATH FOLEY 2WAY SLVR 5CC 20FR (CATHETERS) ×1 IMPLANT
CATH ROBINSON RED A/P 8FR (CATHETERS) ×2 IMPLANT
CATH TIEMANN FOLEY 18FR 5CC (CATHETERS) ×2 IMPLANT
CHLORAPREP W/TINT 26ML (MISCELLANEOUS) ×2 IMPLANT
CLIP LIGATING HEM O LOK PURPLE (MISCELLANEOUS) ×4 IMPLANT
CLOTH BEACON ORANGE TIMEOUT ST (SAFETY) ×2 IMPLANT
CORD HIGH FREQUENCY UNIPOLAR (ELECTROSURGICAL) ×2 IMPLANT
COVER SURGICAL LIGHT HANDLE (MISCELLANEOUS) ×2 IMPLANT
COVER TIP SHEARS 8 DVNC (MISCELLANEOUS) ×1 IMPLANT
COVER TIP SHEARS 8MM DA VINCI (MISCELLANEOUS) ×1
CUTTER ECHEON FLEX ENDO 45 340 (ENDOMECHANICALS) ×2 IMPLANT
DECANTER SPIKE VIAL GLASS SM (MISCELLANEOUS) ×1 IMPLANT
DRAPE SURG IRRIG POUCH 19X23 (DRAPES) ×2 IMPLANT
DRAPE UTILITY 15X26 (DRAPE) ×1 IMPLANT
DRSG TEGADERM 2-3/8X2-3/4 SM (GAUZE/BANDAGES/DRESSINGS) ×8 IMPLANT
DRSG TEGADERM 4X4.75 (GAUZE/BANDAGES/DRESSINGS) ×3 IMPLANT
DRSG TEGADERM 6X8 (GAUZE/BANDAGES/DRESSINGS) ×6 IMPLANT
ELECT REM PT RETURN 9FT ADLT (ELECTROSURGICAL) ×2
ELECTRODE REM PT RTRN 9FT ADLT (ELECTROSURGICAL) ×1 IMPLANT
GLOVE BIO SURGEON STRL SZ 6.5 (GLOVE) ×4 IMPLANT
GLOVE BIOGEL M STRL SZ7.5 (GLOVE) ×2 IMPLANT
GOWN PREVENTION PLUS XLARGE (GOWN DISPOSABLE) ×4 IMPLANT
GOWN STRL NON-REIN LRG LVL3 (GOWN DISPOSABLE) ×5 IMPLANT
GOWN STRL REIN XL XLG (GOWN DISPOSABLE) ×2 IMPLANT
HEMOSTAT SURGICEL 2X3 (HEMOSTASIS) ×1 IMPLANT
HOLDER FOLEY CATH W/STRAP (MISCELLANEOUS) ×2 IMPLANT
IV LACTATED RINGERS 1000ML (IV SOLUTION) ×2 IMPLANT
KIT ACCESSORY DA VINCI DISP (KITS) ×1
KIT ACCESSORY DVNC DISP (KITS) ×1 IMPLANT
NDL SAFETY ECLIPSE 18X1.5 (NEEDLE) ×1 IMPLANT
NEEDLE HYPO 18GX1.5 SHARP (NEEDLE) ×2
PACK ROBOT UROLOGY CUSTOM (CUSTOM PROCEDURE TRAY) ×2 IMPLANT
RELOAD GREEN ECHELON 45 (STAPLE) ×2 IMPLANT
SEALER TISSUE G2 CVD JAW 45CM (ENDOMECHANICALS) ×1 IMPLANT
SET TUBE IRRIG SUCTION NO TIP (IRRIGATION / IRRIGATOR) ×2 IMPLANT
SOLUTION ELECTROLUBE (MISCELLANEOUS) ×2 IMPLANT
SPONGE GAUZE 4X4 12PLY (GAUZE/BANDAGES/DRESSINGS) ×2 IMPLANT
SURGIFLO W/THROMBIN 8M KIT (HEMOSTASIS) ×1 IMPLANT
SUT ETHILON 3 0 PS 1 (SUTURE) ×1 IMPLANT
SUT VIC AB 2-0 SH 27 (SUTURE) ×8
SUT VIC AB 2-0 SH 27X BRD (SUTURE) ×1 IMPLANT
SUT VICRYL 0 UR6 27IN ABS (SUTURE) ×2 IMPLANT
SUT VICRYL 3 0 UR 6 27 (SUTURE) ×1 IMPLANT
SYR 27GX1/2 1ML LL SAFETY (SYRINGE) ×2 IMPLANT
TOWEL OR NON WOVEN STRL DISP B (DISPOSABLE) ×2 IMPLANT
WATER STERILE IRR 1500ML POUR (IV SOLUTION) ×4 IMPLANT

## 2011-12-15 NOTE — Preoperative (Addendum)
Beta Blockers   Reason not to administer Beta Blockers:Not Applicable 

## 2011-12-15 NOTE — Progress Notes (Signed)
HGB. AND HCT. DRAWN BY LAB 

## 2011-12-15 NOTE — Progress Notes (Signed)
Day of Surgery Subjective: Patient reports some nausea.  His pain is well controlled. Has not ambulated yet  Objective: Vital signs in last 24 hours: Temp:  [97.6 F (36.4 C)-99 F (37.2 C)] 98 F (36.7 C) (12/11 1446) Pulse Rate:  [68-86] 86  (12/11 1446) Resp:  [16-22] 22  (12/11 1446) BP: (99-140)/(50-77) 132/58 mmHg (12/11 1446) SpO2:  [95 %-100 %] 96 % (12/11 1446) Weight:  [86.8 kg (191 lb 5.8 oz)] 86.8 kg (191 lb 5.8 oz) (12/11 1415)  Intake/Output from previous day:   Intake/Output this shift: Total I/O In: 4350 [I.V.:3350; IV Piggyback:1000] Out: 605 [Urine:175; Drains:80; Blood:350]  Physical Exam:  General:alert, cooperative and no distress Cardiovascular: RRR Lungs: BS clear and unlabored; O2 in place via [place] GI: soft Incisions: dressings C/D/I Urine: blue Extremities:SCDs in place  Lab Results:  Basename 12/15/11 1301  HGB 13.6  HCT 39.1    Results for orders placed during the hospital encounter of 12/10/11  SURGICAL PCR SCREEN     Status: Normal   Collection Time   12/10/11  8:05 AM      Component Value Range Status Comment   MRSA, PCR NEGATIVE  NEGATIVE Final    Staphylococcus aureus NEGATIVE  NEGATIVE Final     Studies/Results: No results found.  Assessment/Plan: Day of Surgery, Procedure(s) (LRB): ROBOTIC ASSISTED LAPAROSCOPIC RADICAL PROSTATECTOMY (N/A)  Continue to monitor Ambulate, Incentive spirometry DVT prophylaxis   LOS: 0 days   Silas Flood. 12/15/2011, 4:37 PM

## 2011-12-15 NOTE — Interval H&P Note (Signed)
History and Physical Interval Note:  12/15/2011 8:28 AM  Mario Burns  has presented today for surgery, with the diagnosis of PROSTATE CANCER  The various methods of treatment have been discussed with the patient and family. After consideration of risks, benefits and other options for treatment, the patient has consented to  Procedure(s) (LRB) with comments: ROBOTIC ASSISTED LAPAROSCOPIC RADICAL PROSTATECTOMY (N/A) - POSSIBLE BILATERAL PELVIC LYMPH NODE DISSECTION as a surgical intervention .  The patient's history has been reviewed, patient examined, no change in status, stable for surgery.  I have reviewed the patient's chart and labs.  Questions were answered to the patient's satisfaction.     Melinna Linarez,Shondell S

## 2011-12-15 NOTE — Progress Notes (Signed)
Dr. Isabel Caprice in to see patient- made aware of HGB. AND HCT. RESULTS- - HGB. 13.6- HCT. 39.1

## 2011-12-15 NOTE — Op Note (Signed)
Preoperative diagnosis: Clinical stage T1c Adenocarcinoma prostate  Postoperative diagnosis: Same  Procedure: Robotic-assisted laparoscopic radical retropubic prostatectomy  Surgeon: Valetta Fuller, MD  Asst.: Pecola Leisure, PA Anesthesia: Gen. Endotracheal  Indications: Patient was diagnosed with clinical stage TIc Adenocarcinoma the prostate. He underwent extensive consultation with regard to treatment options. The patient decided on a surgical approach. He appeared to understand the distinct advantages as well as the disadvantages of this procedure. The patient has performed a mechanical bowel prep. He has had placement of PAS compression boots and has received perioperative antibiotics. The patient's preoperative PSA was 4.5. Ultrasound revealed a 42 g prostate.  The patient had severe voiding symptoms preoperatively with an AUA symptom score of over 30. Technique and findings:The patient was brought to the operating room and had successful induction of general endotracheal anesthesia.the patient was placed in a low lithotomy position with careful padding of all extremities. He was secured to the operative table and placed in the steep Trendelenburg position. He was prepped and draped in usual manner. A Foley catheter was placed sterilely on the field. Camera port site was chosen 18 cm above the pubic symphysis just to the left of the umbilicus. A standard open Hassan technique was utilized. A 12 mm trocar was placed without difficulty. The camera was then inserted and no abnormalities were noted within the pelvis. The trochars were placed with direct visual guidance. This included 3 8mm robotic trochars and a 12 mm and 5 mm assist ports. Once all the ports were placed the robot was docked. The bladder was filled and the space of Retzius was developed with electrocautery dissection as well as blunt dissection. Superficial fat off the endopelvic fascia and bladder neck was removed with electrocautery  scissors. The endopelvic fascia was then incised bilaterally from base to apex. Levator musculature was swept off the apex of the prostate isolating the dorsal venous complex which was then stapled with the ETS stapling device. The anterior bladder neck was identified with the aid of the Foley balloon. This was then transected down to the Foley catheter with electrocautery scissors. The Foley catheter was then retracted anteriorly. Indigo carmine was given and we appeared to be well away from the ureteral orifices. The posterior bladder neck was then carefully inspected. We could see a nodular area in the midline trigone just distal to the ureteral orifices. This area appeared to be firm. It did not appear to be a traditional transitional cell carcinoma and probably was inflammatory or a small ectopic prostatic nodule. There did not appear to be an obvious middle lobe component of the bladder and the posterior bladder neck appeared to be really quite thin. We did excise this posterior aspect of the bladder neck and this abnormal-appearing nodule. With that we were fairly close to the ureteral orifices on both sides. I do not however feel comfortable leaving this behind without having a some pathologic information. Attention was then turned towards developing the plane underneath the bladder for identification of the adnexal structures. The seminal vesicles and vas deferens on both sides were then individually dissected free and retracted anteriorly. The posterior plane between the rectum and prostate was then established primarily with blunt dissection.  Attention was then turned towards nerve sparing. The patient was felt to be a candidate for left-sided nerve sparing and limited right-sided nerve sparing. Superficial fascia along the anterior lateral aspect of the prostate was incised bilaterally. This tissue was then swept laterally until we were able to establish a  groove between the neurovascular tissue and  the posterior lateral aspect on the prostate bilaterally. This groove was then extended from the apex back to the base of the prostate. With the prostate retracted anteriorly the vascular pedicles of the prostate were taken with the Enseal device. The Foley catheter was then reinserted and the anterior urethra was transected. The posterior urethra was then transected as were some rectourethralis fibers. The prostate was then removed from the pelvis. The pelvis was then copiously irrigated. Rectal insufflation was performed and there was no evidence of rectal injury.   Attention was then turned towards reconstruction. The bladder neck was fairly large after removing the posterior section. It was also fairly thin posteriorly. I placed some puckering sutures at the 3 and 9:00 position utilizing a figure-of-eight 2-0 Vicryl suture.. The bladder neck and posterior urethra were reapproximated at the 6:00 position utilizing a 2-0 Vicryl suture. The rest of the anastomosis was done with a double-armed 3-0 Monocryl suture in a 360 degree manner. Additional indigo carmine was given. A new catheter was placed and bladder irrigation did reveal some mild leakage. We felt that we would only make the situation worse if we attempted to do the reconstruction/3 anastomosis given the difficulty.Leida Lauth drain was placed through one of the robotic trochars and positioned in the retropubic space above the anastomosis. This was then secured to the skin with a nylon suture. The prostate was placed in the Endopouch retrieval bag. The 12 mm trocar site was closed with a Vicryl suture with the aid of a suture passer. Our other trochars were taken out with direct visual guidance without evidence of any bleeding. The camera port incision was extended slightly to allow for removal of the specimen and then closed with a running Vicryl suture. All port sites were infiltrated with Marcaine and then closed with surgical clips. The patient was  then taken to recovery room having had no obvious complications or problems. Sponge and needle counts were correct.

## 2011-12-15 NOTE — Transfer of Care (Signed)
Immediate Anesthesia Transfer of Care Note  Patient: Mario Burns  Procedure(s) Performed: Procedure(s) (LRB) with comments: ROBOTIC ASSISTED LAPAROSCOPIC RADICAL PROSTATECTOMY (N/A)  Patient Location: PACU  Anesthesia Type:General  Level of Consciousness: sedated  Airway & Oxygen Therapy: Patient Spontanous Breathing and Patient connected to face mask oxygen  Post-op Assessment: Report given to PACU RN and Post -op Vital signs reviewed and stable  Post vital signs: Reviewed and stable  Complications: No apparent anesthesia complications

## 2011-12-15 NOTE — Anesthesia Procedure Notes (Signed)
Procedure Name: Intubation Date/Time: 12/15/2011 8:47 AM Performed by: Leroy Libman L Patient Re-evaluated:Patient Re-evaluated prior to inductionOxygen Delivery Method: Circle system utilized Preoxygenation: Pre-oxygenation with 100% oxygen Intubation Type: IV induction Ventilation: Mask ventilation without difficulty and Oral airway inserted - appropriate to patient size Laryngoscope Size: Miller and 3 Grade View: Grade II Tube type: Oral Tube size: 8.0 mm Number of attempts: 1 Airway Equipment and Method: Stylet Placement Confirmation: ETT inserted through vocal cords under direct vision,  breath sounds checked- equal and bilateral and positive ETCO2 Secured at: 22 cm Tube secured with: Tape Dental Injury: Teeth and Oropharynx as per pre-operative assessment

## 2011-12-15 NOTE — Anesthesia Preprocedure Evaluation (Addendum)
Anesthesia Evaluation  Patient identified by MRN, date of birth, ID band Patient awake    Reviewed: Allergy & Precautions, H&P , NPO status , Patient's Chart, lab work & pertinent test results  Airway Mallampati: III TM Distance: >3 FB Neck ROM: Full    Dental  (+) Dental Advisory Given and Teeth Intact   Pulmonary shortness of breath and with exertion, sleep apnea ,  breath sounds clear to auscultation  Pulmonary exam normal       Cardiovascular hypertension, Pt. on medications Rhythm:Regular Rate:Normal     Neuro/Psych Myasthenia gravis, in remission per neurologist  Neuromuscular disease    GI/Hepatic hiatal hernia, GERD-  Medicated,  Endo/Other  diabetes, Type 2, Oral Hypoglycemic AgentsHypothyroidism   Renal/GU      Musculoskeletal   Abdominal   Peds  Hematology   Anesthesia Other Findings   Reproductive/Obstetrics                        Anesthesia Physical Anesthesia Plan  ASA: III  Anesthesia Plan: General   Post-op Pain Management:    Induction: Intravenous  Airway Management Planned: Oral ETT  Additional Equipment:   Intra-op Plan:   Post-operative Plan: Extubation in OR  Informed Consent: I have reviewed the patients History and Physical, chart, labs and discussed the procedure including the risks, benefits and alternatives for the proposed anesthesia with the patient or authorized representative who has indicated his/her understanding and acceptance.   Dental advisory given  Plan Discussed with: CRNA  Anesthesia Plan Comments:         Anesthesia Quick Evaluation

## 2011-12-15 NOTE — Anesthesia Postprocedure Evaluation (Signed)
Anesthesia Post Note  Patient: Mario Burns  Procedure(s) Performed: Procedure(s) (LRB): ROBOTIC ASSISTED LAPAROSCOPIC RADICAL PROSTATECTOMY (N/A)  Anesthesia type: General  Patient location: PACU  Post pain: Pain level controlled  Post assessment: Post-op Vital signs reviewed  Last Vitals: BP 114/59  Pulse 68  Temp 36.5 C (Oral)  Resp 17  SpO2 96%  Post vital signs: Reviewed  Level of consciousness: sedated  Complications: No apparent anesthesia complications

## 2011-12-16 ENCOUNTER — Encounter (HOSPITAL_COMMUNITY): Payer: Self-pay | Admitting: Urology

## 2011-12-16 DIAGNOSIS — E119 Type 2 diabetes mellitus without complications: Secondary | ICD-10-CM

## 2011-12-16 HISTORY — DX: Type 2 diabetes mellitus without complications: E11.9

## 2011-12-16 LAB — GLUCOSE, CAPILLARY: Glucose-Capillary: 191 mg/dL — ABNORMAL HIGH (ref 70–99)

## 2011-12-16 LAB — BASIC METABOLIC PANEL
BUN: 16 mg/dL (ref 6–23)
GFR calc Af Amer: 81 mL/min — ABNORMAL LOW (ref >90)
GFR calc non Af Amer: 70 mL/min — ABNORMAL LOW (ref >90)
Potassium: 4.3 mEq/L (ref 3.5–5.1)
Sodium: 133 mEq/L — ABNORMAL LOW (ref 135–145)

## 2011-12-16 LAB — CREATININE, FLUID (PLEURAL, PERITONEAL, JP DRAINAGE): Creat, Fluid: 78.7 mg/dL

## 2011-12-16 LAB — HEMOGLOBIN AND HEMATOCRIT, BLOOD
HCT: 38.3 % — ABNORMAL LOW (ref 39.0–52.0)
Hemoglobin: 12.7 g/dL — ABNORMAL LOW (ref 13.0–17.0)

## 2011-12-16 MED ORDER — BISACODYL 10 MG RE SUPP
10.0000 mg | Freq: Once | RECTAL | Status: AC
  Administered 2011-12-16: 10 mg via RECTAL
  Filled 2011-12-16: qty 1

## 2011-12-16 MED ORDER — BISACODYL 10 MG RE SUPP
10.0000 mg | Freq: Once | RECTAL | Status: AC
  Administered 2011-12-17: 10 mg via RECTAL
  Filled 2011-12-16 (×2): qty 1

## 2011-12-16 NOTE — Progress Notes (Signed)
Inpatient Diabetes Program Recommendations  AACE/ADA: New Consensus Statement on Inpatient Glycemic Control (2013)  Target Ranges:  Prepandial:   less than 140 mg/dL      Peak postprandial:   less than 180 mg/dL (1-2 hours)      Critically ill patients:  140 - 180 mg/dL   Reason for Visit: Sub-optimal CBG's  Inpatient Diabetes Program Recommendations HgbA1C: No known Hgb A1c. Request MD order.  Note:  Patient for possible D/C later today.  If patient not discharged, please consider ordering an updated Hgb A1C.  MD, please add the diagnosis of diabetes to the Hospital Problem List.    Thank you Danielle Rankin. Elsie Lincoln, RN, CNS, CDE Inpatient Diabetes Program, team pager [phone]

## 2011-12-16 NOTE — Progress Notes (Signed)
   CARE MANAGEMENT NOTE 12/16/2011  Patient:  Mario Burns, Mario Burns   Account Number:  [id number]  Date Initiated:  12/16/2011  Documentation initiated by:  Jiles Crocker  Subjective/Objective Assessment:   ADMITTED FOR SURGERY- ROBOTIC ASSISTED LAP RADICAL PROSTATECTOMY     Action/Plan:   LIVES AT HOME WITH SPOUSE   Anticipated DC Date:  12/17/2011   Anticipated DC Plan:  HOME/SELF CARE      Status of service:  In process, will continue to follow Medicare Important Message given?  NA - LOS <3 / Initial given by admissions (If response is "NO", the following Medicare IM given date fields will be blank)  Per UR Regulation:  Reviewed for med. necessity/level of care/duration of stay  Comments:  12/16/2011- B Garrett Mitchum RN,BSN,MHA

## 2011-12-16 NOTE — Progress Notes (Addendum)
1 Day Post-Op Subjective: Patient reports pain control adequate.  Pt feels better this morning.  He has not ambulated.  Last night he had lower abdominal pain and some dizziness.  He is tolerating clears.  No flatus.   Objective: Vital signs in last 24 hours: Temp:  [97 F (36.1 C)-98.8 F (37.1 C)] 98.8 F (37.1 C) (12/12 0634) Pulse Rate:  [68-86] 80  (12/12 0634) Resp:  [16-22] 20  (12/12 0634) BP: (99-134)/(48-72) 109/59 mmHg (12/12 0634) SpO2:  [92 %-100 %] 96 % (12/12 0634) Weight:  [86.8 kg (191 lb 5.8 oz)] 86.8 kg (191 lb 5.8 oz) (12/11 1415)  Intake/Output from previous day: 12/11 0701 - 12/12 0700 In: 6325 [I.V.:5025; IV Piggyback:1300] Out: 2075 [Urine:1025; Drains:700; Blood:350] Intake/Output this shift:    Physical Exam:  General:alert, cooperative and no distress Cardiovascular: RRR Lungs: crackles right base; decreased BS left base GI: soft, appropriately tender, decreased bowel sounds, no palpable masses Incisions: dressings C/D/I Urine: clear Extremities: warm with SCDs in place  Lab Results:  Basename 12/16/11 0455 12/15/11 1301  HGB 12.7* 13.6  HCT 38.3* 39.1   BMET  Basename 12/16/11 0455  NA 133*  K 4.3  CL 100  CO2 24  GLUCOSE 238*  BUN 16  CREATININE 1.08  CALCIUM 8.4    Results for orders placed during the hospital encounter of 12/10/11  SURGICAL PCR SCREEN     Status: Normal   Collection Time   12/10/11  8:05 AM      Component Value Range Status Comment   MRSA, PCR NEGATIVE  NEGATIVE Final    Staphylococcus aureus NEGATIVE  NEGATIVE Final     Studies/Results: No results found.  Assessment/Plan: 1 Day Post-Op, Procedure(s) (LRB): ROBOTIC ASSISTED LAPAROSCOPIC RADICAL PROSTATECTOMY (N/A)  Pt feeling better this morning with decreased dizziness.  He has significant output from his JP and likely has a urine leak.  JP drain Cr pending.  Pending how pt progresses today he may d/c home later.  Will monitor JP output to determine if  it can be removed prior to d/c or if he and wife will need to be taught drain care.   Ambulate, Incentive spirometry DVT prophylaxis Transition to PO pain medications Check drain creatinine level SL IVF Dulcolax supp.   LOS: 1 day   YARBROUGH,Ernestine Rohman G. 12/16/2011, 7:30 AM     Addendum: Pt still not feeling really well and significant drainage from JP persists.  JP Cr is consistent with urine leak.  Will keep again overnight to observe.  He did not have a result with the dulcolax.  Continue clears and will repeat in a.m.  Stressed continued ambulation. Pt and wife agree.

## 2011-12-16 NOTE — Progress Notes (Addendum)
Patient's JP site was leaking around bandage, with increased output.  At 2200 output was 110 ml, at 2345 output was 250 ml. Patients vitals were stable, MD notified and gave orders to reinforce dressing. Will continue to monitor.

## 2011-12-17 LAB — BASIC METABOLIC PANEL
BUN: 17 mg/dL (ref 6–23)
GFR calc Af Amer: 53 mL/min — ABNORMAL LOW (ref >90)
GFR calc non Af Amer: 46 mL/min — ABNORMAL LOW (ref >90)
Potassium: 4.2 mEq/L (ref 3.5–5.1)

## 2011-12-17 NOTE — Progress Notes (Signed)
Patient ID: Mario Burns, male   DOB: [birth year], 66 y.o.   MRN: [id number] 2 Days Post-Op Subjective: Patient reports some ongoing nausea, but he had a ok night. Excellent urine output ( clear ) and JP down to 5 ml  Objective: Vital signs in last 24 hours: Temp:  [98.1 F (36.7 C)-99.3 F (37.4 C)] 99.3 F (37.4 C) (12/13 0652) Pulse Rate:  [74-94] 93  (12/13 0652) Resp:  [18-20] 18  (12/13 0652) BP: (91-105)/(57-65) 103/65 mmHg (12/13 0652) SpO2:  [95 %-97 %] 95 % (12/13 0652)  Intake/Output from previous day: 12/12 0701 - 12/13 0700 In: 2100 [P.O.:2100] Out: 945 [Urine:700; Drains:245] Intake/Output this shift:    Physical Exam:  Constitutional: Vital signs reviewed. WD WN in NAD   Eyes: PERRL, No scleral icterus.   Cardiovascular: RRR Pulmonary/Chest: Normal effort Abdominal: Soft.  Genitourinary:no change Extremities: No cyanosis or edema   Lab Results:  Basename 12/16/11 0455 12/15/11 1301  HGB 12.7* 13.6  HCT 38.3* 39.1   BMET  Basename 12/17/11 0435 12/16/11 0455  NA 132* 133*  K 4.2 4.3  CL 100 100  CO2 25 24  GLUCOSE 158* 238*  BUN 17 16  CREATININE 1.52* 1.08  CALCIUM 8.6 8.4   No results found for this basename: LABPT:3,INR:3 in the last 72 hours No results found for this basename: LABURIN:1 in the last 72 hours Results for orders placed during the hospital encounter of 12/10/11  SURGICAL PCR SCREEN     Status: Normal   Collection Time   12/10/11  8:05 AM      Component Value Range Status Comment   MRSA, PCR NEGATIVE  NEGATIVE Final    Staphylococcus aureus NEGATIVE  NEGATIVE Final     Studies/Results: No results found.  Assessment/Plan:   Better.  D/C JP  D/C home later this AM   LOS: 2 days   Elanna Bert,Mario Burns 12/17/2011, 7:29 AM

## 2011-12-21 NOTE — Discharge Summary (Signed)
  Date of admission: 12/15/2011  Date of discharge: 12/17/11  Admission diagnosis: Prostate Cancer  Discharge diagnosis: Prostate Cancer  History and Physical: For full details, please see admission history and physical. Briefly, Mario Burns is a 66 y.o. gentleman with localized prostate cancer.  After discussing management/treatment options, he elected to proceed with surgical treatment.  Hospital Course: Mario Burns was taken to the operating room on 12/15/2011 and underwent a robotic assisted laparoscopic radical prostatectomy. He tolerated this procedure well and without complications. Postoperatively, he was able to be transferred to a regular hospital room following recovery from anesthesia.  He was able to begin ambulating POD 1 . He remained hemodynamically stable overnight.  He had excellent urine output.  His JP drainage was significant therefore a drain Cr level was checked.  This value was consistent with urine denoting a urine leak.  Due to this and mild pain control issues the pt was kept an additional night for observation.  The JP remained in place until POD 2 at which time the volume had significantly decreased and it was discontinued.  He was transitioned to oral pain medication, tolerated a clear liquid diet, and had met all discharge criteria and was able to be discharged home later on POD#2.  Laboratory values:  Lab Results  Component Value Date   WBC 6.5 12/10/2011   HGB 12.7* 12/16/2011   HCT 38.3* 12/16/2011   MCV 86.6 12/10/2011   PLT 271 12/10/2011   Lab Results  Component Value Date   CREATININE 1.52* 12/17/2011    Disposition: Home  Discharge instruction: He was instructed to be ambulatory but to refrain from heavy lifting, strenuous activity, or driving. He was instructed on urethral catheter care as well as a bowel regimen.  Discharge medications:     Medication List     As of 12/21/2011  8:24 AM    START taking these medications        ciprofloxacin 500 MG tablet   Commonly known as: CIPRO   Take 1 tablet (500 mg total) by mouth 2 (two) times daily. Start day prior to office visit for foley removal      HYDROcodone-acetaminophen 5-325 MG per tablet   Commonly known as: NORCO/VICODIN   Take 1-2 tablets by mouth every 6 (six) hours as needed for pain.      CONTINUE taking these medications         glimepiride 4 MG tablet   Commonly known as: AMARYL      levothyroxine 125 MCG tablet   Commonly known as: SYNTHROID, LEVOTHROID      olmesartan 20 MG tablet   Commonly known as: BENICAR      omeprazole 20 MG capsule   Commonly known as: PRILOSEC      STOP taking these medications         naproxen sodium 220 MG tablet   Commonly known as: ANAPROX          Where to get your medications    These are the prescriptions that you need to pick up.   You may get these medications from any pharmacy.         ciprofloxacin 500 MG tablet   HYDROcodone-acetaminophen 5-325 MG per tablet            Followup: He will followup in 1 week for catheter removal and to discuss his surgical pathology results.

## 2012-05-12 ENCOUNTER — Other Ambulatory Visit: Payer: Self-pay | Admitting: Family Medicine

## 2012-05-12 ENCOUNTER — Ambulatory Visit
Admission: RE | Admit: 2012-05-12 | Discharge: 2012-05-12 | Disposition: A | Discharge status: Home / Self Care | Payer: Medicare Other | Source: Ambulatory Visit | Attending: Family Medicine | Admitting: Family Medicine

## 2012-05-12 DIAGNOSIS — M25561 Pain in right knee: Secondary | ICD-10-CM

## 2012-06-05 ENCOUNTER — Other Ambulatory Visit (HOSPITAL_COMMUNITY): Payer: Self-pay | Admitting: Physician Assistant

## 2012-06-05 ENCOUNTER — Ambulatory Visit (HOSPITAL_COMMUNITY)
Admission: RE | Admit: 2012-06-05 | Discharge: 2012-06-05 | Disposition: A | Discharge status: Home / Self Care | Payer: Medicare Other | Source: Ambulatory Visit | Attending: Physician Assistant | Admitting: Physician Assistant

## 2012-06-05 DIAGNOSIS — Z1389 Encounter for screening for other disorder: Secondary | ICD-10-CM | POA: Insufficient documentation

## 2012-06-05 DIAGNOSIS — M549 Dorsalgia, unspecified: Secondary | ICD-10-CM

## 2012-07-19 ENCOUNTER — Other Ambulatory Visit: Payer: Self-pay | Admitting: Urology

## 2012-07-25 ENCOUNTER — Encounter (HOSPITAL_BASED_OUTPATIENT_CLINIC_OR_DEPARTMENT_OTHER): Payer: Self-pay | Admitting: *Deleted

## 2012-07-26 ENCOUNTER — Encounter (HOSPITAL_BASED_OUTPATIENT_CLINIC_OR_DEPARTMENT_OTHER): Payer: Self-pay | Admitting: *Deleted

## 2012-07-26 NOTE — Progress Notes (Signed)
NPO AFTER MN. ARRIVES AT 0745. NEEDS ISTAT. CURRENT EKG AND CHEST CT IN EPIC AND CHART. WILL TAKE PRILOSEC AM OF SURG W/ SIPS OF WATER.

## 2012-08-02 ENCOUNTER — Encounter (HOSPITAL_BASED_OUTPATIENT_CLINIC_OR_DEPARTMENT_OTHER): Payer: Self-pay | Admitting: Anesthesiology

## 2012-08-02 ENCOUNTER — Ambulatory Visit (HOSPITAL_COMMUNITY): Payer: Medicare Other

## 2012-08-02 ENCOUNTER — Ambulatory Visit (HOSPITAL_BASED_OUTPATIENT_CLINIC_OR_DEPARTMENT_OTHER): Payer: Medicare Other | Admitting: Anesthesiology

## 2012-08-02 ENCOUNTER — Ambulatory Visit (HOSPITAL_BASED_OUTPATIENT_CLINIC_OR_DEPARTMENT_OTHER)
Admission: RE | Admit: 2012-08-02 | Discharge: 2012-08-02 | Disposition: A | Discharge status: Home / Self Care | Payer: Medicare Other | Source: Ambulatory Visit | Attending: Urology | Admitting: Urology

## 2012-08-02 ENCOUNTER — Encounter (HOSPITAL_BASED_OUTPATIENT_CLINIC_OR_DEPARTMENT_OTHER)
Admission: RE | Disposition: A | Discharge status: Home / Self Care | Payer: Self-pay | Source: Ambulatory Visit | Attending: Urology

## 2012-08-02 ENCOUNTER — Encounter (HOSPITAL_BASED_OUTPATIENT_CLINIC_OR_DEPARTMENT_OTHER): Payer: Self-pay | Admitting: *Deleted

## 2012-08-02 DIAGNOSIS — C61 Malignant neoplasm of prostate: Secondary | ICD-10-CM | POA: Insufficient documentation

## 2012-08-02 DIAGNOSIS — N320 Bladder-neck obstruction: Secondary | ICD-10-CM | POA: Insufficient documentation

## 2012-08-02 DIAGNOSIS — E7800 Pure hypercholesterolemia, unspecified: Secondary | ICD-10-CM | POA: Insufficient documentation

## 2012-08-02 DIAGNOSIS — Z9889 Other specified postprocedural states: Secondary | ICD-10-CM | POA: Insufficient documentation

## 2012-08-02 DIAGNOSIS — N318 Other neuromuscular dysfunction of bladder: Secondary | ICD-10-CM | POA: Insufficient documentation

## 2012-08-02 DIAGNOSIS — Z79899 Other long term (current) drug therapy: Secondary | ICD-10-CM | POA: Insufficient documentation

## 2012-08-02 DIAGNOSIS — G4730 Sleep apnea, unspecified: Secondary | ICD-10-CM | POA: Insufficient documentation

## 2012-08-02 DIAGNOSIS — N393 Stress incontinence (female) (male): Secondary | ICD-10-CM | POA: Insufficient documentation

## 2012-08-02 DIAGNOSIS — E119 Type 2 diabetes mellitus without complications: Secondary | ICD-10-CM | POA: Insufficient documentation

## 2012-08-02 DIAGNOSIS — I10 Essential (primary) hypertension: Secondary | ICD-10-CM | POA: Insufficient documentation

## 2012-08-02 HISTORY — DX: Personal history of malignant neoplasm of prostate: Z85.46

## 2012-08-02 HISTORY — DX: Other complications of anesthesia, initial encounter: T88.59XA

## 2012-08-02 HISTORY — DX: Adverse effect of unspecified anesthetic, initial encounter: T41.45XA

## 2012-08-02 HISTORY — DX: Obstructive sleep apnea (adult) (pediatric): G47.33

## 2012-08-02 HISTORY — DX: Type 2 diabetes mellitus without complications: E11.9

## 2012-08-02 HISTORY — PX: CYSTOSCOPY WITH URETHRAL DILATATION: SHX5125

## 2012-08-02 LAB — POCT I-STAT, CHEM 8
Calcium, Ion: 1.26 mmol/L (ref 1.13–1.30)
Chloride: 104 mEq/L (ref 96–112)
Creatinine, Ser: 0.7 mg/dL (ref 0.50–1.35)
Glucose, Bld: 202 mg/dL — ABNORMAL HIGH (ref 70–99)
HCT: 45 % (ref 39.0–52.0)

## 2012-08-02 SURGERY — CYSTOSCOPY, WITH URETHRAL DILATION
Anesthesia: General | Site: Bladder | Wound class: Clean Contaminated

## 2012-08-02 MED ORDER — LIDOCAINE HCL (CARDIAC) 20 MG/ML IV SOLN
INTRAVENOUS | Status: DC | PRN
  Administered 2012-08-02: 80 mg via INTRAVENOUS

## 2012-08-02 MED ORDER — CIPROFLOXACIN IN D5W 400 MG/200ML IV SOLN
400.0000 mg | INTRAVENOUS | Status: AC
  Administered 2012-08-02: 400 mg via INTRAVENOUS
  Filled 2012-08-02: qty 200

## 2012-08-02 MED ORDER — LACTATED RINGERS IV SOLN
INTRAVENOUS | Status: DC
  Administered 2012-08-02: 08:00:00 via INTRAVENOUS
  Filled 2012-08-02: qty 1000

## 2012-08-02 MED ORDER — FENTANYL CITRATE 0.05 MG/ML IJ SOLN
25.0000 ug | INTRAMUSCULAR | Status: DC | PRN
  Filled 2012-08-02: qty 1

## 2012-08-02 MED ORDER — PROPOFOL 10 MG/ML IV BOLUS
INTRAVENOUS | Status: DC | PRN
  Administered 2012-08-02: 180 mg via INTRAVENOUS

## 2012-08-02 MED ORDER — DEXAMETHASONE SODIUM PHOSPHATE 4 MG/ML IJ SOLN
INTRAMUSCULAR | Status: DC | PRN
  Administered 2012-08-02: 8 mg via INTRAVENOUS

## 2012-08-02 MED ORDER — IOHEXOL 350 MG/ML SOLN
INTRAVENOUS | Status: DC | PRN
  Administered 2012-08-02: 10 mL

## 2012-08-02 MED ORDER — HYDROCODONE-ACETAMINOPHEN 5-325 MG PO TABS: 1.0000 | ORAL_TABLET | Freq: Four times a day (QID) | ORAL | Status: DC | PRN

## 2012-08-02 MED ORDER — MEPERIDINE HCL 25 MG/ML IJ SOLN
6.2500 mg | INTRAMUSCULAR | Status: DC | PRN
  Filled 2012-08-02: qty 1

## 2012-08-02 MED ORDER — MIDAZOLAM HCL 5 MG/5ML IJ SOLN
INTRAMUSCULAR | Status: DC | PRN
  Administered 2012-08-02: 2 mg via INTRAVENOUS

## 2012-08-02 MED ORDER — CIPROFLOXACIN HCL 250 MG PO TABS: 250.0000 mg | ORAL_TABLET | Freq: Two times a day (BID) | ORAL | Status: DC

## 2012-08-02 MED ORDER — HYDROCODONE-ACETAMINOPHEN 5-325 MG PO TABS
1.0000 | ORAL_TABLET | Freq: Four times a day (QID) | ORAL | Status: DC | PRN
  Administered 2012-08-02: 1 via ORAL
  Filled 2012-08-02: qty 2

## 2012-08-02 MED ORDER — FENTANYL CITRATE 0.05 MG/ML IJ SOLN
INTRAMUSCULAR | Status: DC | PRN
  Administered 2012-08-02: 25 ug via INTRAVENOUS
  Administered 2012-08-02: 50 ug via INTRAVENOUS

## 2012-08-02 MED ORDER — STERILE WATER FOR IRRIGATION IR SOLN
Status: DC | PRN
  Administered 2012-08-02: 3000 mL via INTRAVESICAL

## 2012-08-02 MED ORDER — BELLADONNA ALKALOIDS-OPIUM 16.2-60 MG RE SUPP
RECTAL | Status: DC | PRN
  Administered 2012-08-02: 1 via RECTAL

## 2012-08-02 MED ORDER — ONDANSETRON HCL 4 MG/2ML IJ SOLN
INTRAMUSCULAR | Status: DC | PRN
  Administered 2012-08-02: 4 mg via INTRAVENOUS

## 2012-08-02 MED ORDER — PROMETHAZINE HCL 25 MG/ML IJ SOLN
6.2500 mg | INTRAMUSCULAR | Status: DC | PRN
  Filled 2012-08-02: qty 1

## 2012-08-02 SURGICAL SUPPLY — 26 items
BAG DRAIN URO-CYSTO SKYTR STRL (DRAIN) ×2 IMPLANT
BAG DRN UROCATH (DRAIN) ×1
BALLN NEPHROSTOMY (BALLOONS) ×2
BALLOON NEPHROSTOMY (BALLOONS) ×1 IMPLANT
CANISTER SUCT LVC 12 LTR MEDI- (MISCELLANEOUS) ×1 IMPLANT
CATH FOLEY 2WAY SLVR  5CC 16FR (CATHETERS)
CATH FOLEY 2WAY SLVR 5CC 16FR (CATHETERS) IMPLANT
CLOTH BEACON ORANGE TIMEOUT ST (SAFETY) ×2 IMPLANT
DRAPE CAMERA CLOSED 9X96 (DRAPES) ×2 IMPLANT
ELECT REM PT RETURN 9FT ADLT (ELECTROSURGICAL) ×2
ELECTRODE REM PT RTRN 9FT ADLT (ELECTROSURGICAL) ×1 IMPLANT
GLOVE BIO SURGEON STRL SZ 6.5 (GLOVE) ×2 IMPLANT
GLOVE BIO SURGEON STRL SZ7.5 (GLOVE) ×2 IMPLANT
GLOVE INDICATOR 7.0 STRL GRN (GLOVE) ×2 IMPLANT
GOWN STRL NON-REIN LRG LVL3 (GOWN DISPOSABLE) ×1 IMPLANT
GOWN STRL REIN XL XLG (GOWN DISPOSABLE) ×2 IMPLANT
GOWN XL W/COTTON TOWEL STD (GOWNS) IMPLANT
GUIDEWIRE STR DUAL SENSOR (WIRE) ×1 IMPLANT
NDL SAFETY ECLIPSE 18X1.5 (NEEDLE) IMPLANT
NEEDLE HYPO 18GX1.5 SHARP (NEEDLE)
NEEDLE HYPO 22GX1.5 SAFETY (NEEDLE) IMPLANT
NS IRRIG 500ML POUR BTL (IV SOLUTION) IMPLANT
PACK CYSTOSCOPY (CUSTOM PROCEDURE TRAY) ×2 IMPLANT
SYR 20CC LL (SYRINGE) ×1 IMPLANT
SYR 30ML LL (SYRINGE) IMPLANT
WATER STERILE IRR 3000ML UROMA (IV SOLUTION) ×2 IMPLANT

## 2012-08-02 NOTE — Anesthesia Preprocedure Evaluation (Addendum)
Anesthesia Evaluation  Patient identified by MRN, date of birth, ID band Patient awake    Reviewed: Allergy & Precautions, H&P , NPO status , Patient's Chart, lab work & pertinent test results  History of Anesthesia Complications Negative for: history of anesthetic complications  Airway Mallampati: III TM Distance: >3 FB Neck ROM: Full    Dental  (+) Dental Advisory Given and Teeth Intact   Pulmonary shortness of breath and with exertion, sleep apnea ,  breath sounds clear to auscultation  Pulmonary exam normal       Cardiovascular hypertension, Pt. on medications Rhythm:Regular Rate:Normal     Neuro/Psych Myasthenia gravis, in remission per neurologist  Neuromuscular disease    GI/Hepatic hiatal hernia, GERD-  Medicated,  Endo/Other  diabetes, Type 2, Oral Hypoglycemic AgentsHypothyroidism   Renal/GU      Musculoskeletal   Abdominal   Peds  Hematology   Anesthesia Other Findings   Reproductive/Obstetrics                           Anesthesia Physical Anesthesia Plan  ASA: III  Anesthesia Plan: General   Post-op Pain Management:    Induction: Intravenous  Airway Management Planned: Oral ETT and LMA  Additional Equipment:   Intra-op Plan:   Post-operative Plan:   Informed Consent: I have reviewed the patients History and Physical, chart, labs and discussed the procedure including the risks, benefits and alternatives for the proposed anesthesia with the patient or authorized representative who has indicated his/her understanding and acceptance.   Dental advisory given  Plan Discussed with:   Anesthesia Plan Comments: (MYASTHENIA GRAVIS  Claustrophobic)       Anesthesia Quick Evaluation

## 2012-08-02 NOTE — Op Note (Signed)
Preoperative diagnosis: Bladder neck contracture status post robotic prostatectomy Postoperative diagnosis: Same  Procedure: Cystoscopy, balloon dilation of bladder neck contracture, removal of exposed vascular staple   Surgeon: Valetta Fuller M.D.  Anesthesia: Gen.  Indications: Mario Burns underwent a robotic prostatectomy approximately year ago. He does continue to have some voiding dysfunction with bladder overactivity, slowing of his urinary stream and ongoing moderate stress incontinence. He has had documentation of an anastomotic/bladder neck contracture. We elected to go ahead and repeat assessment under anesthesia to allow for more formal dilation of his bladder neck. Full informed consent has been obtained.     Technique and findings: Patient brought the operating room where he had successful induction of general anesthesia. Placed in lithotomy position prepped and draped in usual manner. The patient had placement of PAS compression boots has received perioperative antibiotics with ciprofloxacin. Appropriate surgical timeout was performed. Cystoscopically the patient had a unremarkable anterior urethra. The sphincter appeared to be reasonably well coapted and was completely intact without any site defects. There was evidence of a significant bladder neck contracture. Guidewire was placed in that area into the bladder using fluoroscopic control.   Over the guidewire we placed a 24 French fascial dilating balloon. This was inflated to approximately 14 atmospheres and kept inflated for approximately 5 minutes. At the completion of this the balloon was removed and cystoscopy was performed. The bladder neck contracture had been significantly dilated and we were able to get the rigid cystoscope into the bladder. No other bladder pathology was appreciated. The patient did have some trabecular change and has a number of small diverticuli. In examining the area the bladder neck on the right lateral side  one could see evidence of exposed staple that was used or ligation of the dorsal venous complex. That staple was removed with a grasper. We then instilled lidocaine jelly in the urethra and a 20 French catheter was placed without difficulty which drained clear urine. The patient was brought to recovery room in stable condition.

## 2012-08-02 NOTE — Anesthesia Procedure Notes (Signed)
Procedure Name: LMA Insertion Date/Time: 08/02/2012 9:20 AM Performed by: Renella Cunas D Pre-anesthesia Checklist: Patient identified, Emergency Drugs available, Suction available and Patient being monitored Patient Re-evaluated:Patient Re-evaluated prior to inductionOxygen Delivery Method: Circle System Utilized Preoxygenation: Pre-oxygenation with 100% oxygen Intubation Type: IV induction Ventilation: Mask ventilation without difficulty LMA: LMA inserted LMA Size: 4.0 Number of attempts: 1 Airway Equipment and Method: bite block Placement Confirmation: positive ETCO2 Tube secured with: Tape Dental Injury: Teeth and Oropharynx as per pre-operative assessment

## 2012-08-02 NOTE — Transfer of Care (Signed)
Immediate Anesthesia Transfer of Care Note  Patient: Mario Burns  Procedure(s) Performed: Procedure(s) (LRB): CYSTOSCOPY WITH URETHRAL DILATATION (N/A)  Patient Location: PACU  Anesthesia Type: General  Level of Consciousness: awake, oriented, sedated and patient cooperative  Airway & Oxygen Therapy: Patient Spontanous Breathing and Patient connected to face mask oxygen  Post-op Assessment: Report given to PACU RN and Post -op Vital signs reviewed and stable  Post vital signs: Reviewed and stable  Complications: No apparent anesthesia complications

## 2012-08-02 NOTE — H&P (Signed)
Reason For Visit   Mario Burns returns for additional followup. Much of his history is summarized below. PSA checked in April continued to be undetectable. He is due for repeat PSA testing but we think he has an excellent chance of continuing to have good reports. His overall voiding is a tad better, but nothing dramatic. He still has at least moderate stress incontinence. Stream tends to be weak. Urinalysis today is actually clear. He does have significant glucosuria and tells me his blood sugars have been running very high lately. No other new complaints or issues. The last time he was here after cystoscopy, he thought that his stream was substantially better for a few days as well as his incontinence. Again, there was a question of just a start of an exposed staple anteriorly.       History of Present Illness                    [name] presents now about 7 months status post his robotic prostatectomy. Postoperatively he was noted to have a urine leak and needed to stay an extra night in the hospital before removal of his Jackson-Pratt drain. The patient did have an ileus which resolved. Patient's final pathology revealed bilateral Gleason 3+3 equals 6 cancer. There was an area of extraprostatic extension on the right side the margins were negative. There was no evidence of seminal vesicle involvement. Cystogram showed evidence of ongoing leakage and therefore his catheter was left for an extended time frame. He did eventually undergo a cystoscopy with voiding trial.  Mario Burns has continued to have at least moderate stress incontinence. He is wearing 3-4 briefs per day. He does void especially if he is not terribly active. The stream is relatively strong at first and then weekends. He is having no pain with voiding. Again initial PSA testing was undetectable.    Past Medical History Problems  1. History of  Arthritis V13.4 2. History of  Diabetes Mellitus 250.00 3. History of  Gastric Ulcer  531.90 4. History of  Heartburn 787.1 5. History of  Hypercholesterolemia 272.0 6. History of  Hypertension 401.9 7. History of  Sleep Apnea 780.57  Surgical History Problems  1. History of  Hernia Repair 2. History of  Prostatect Retropubic Radical W/ Nerve Sparing Laparoscopic 3. History of  Thyroid Surgery Total Thyroidectomy  Current Meds 1. Aleve TABS; Therapy: (Recorded:15Jul2014) to 2. Benicar 20 MG Oral Tablet; Therapy: (Recorded:23Aug2013) to 3. Glimepiride 4 MG Oral Tablet; Therapy: 18Mar2013 to 4. Levothyroxine Sodium 125 MCG Oral Tablet; Therapy: 22Mar2013 to 5. PriLOSEC 20 MG Oral Capsule Delayed Release; Therapy: (Recorded:23Aug2013) to 6. Stool Softener TABS; Therapy: (Recorded:15Jul2014) to  Allergies Medication  1. Sulfa Drugs  Family History Problems  1. Paternal history of  Death In The Family Father 82yrs, PCA 2. Maternal history of  Family Health Status - Mother's Age 66yrs 3. Family history of  Family Health Status Number Of Children 1 son 4. Fraternal history of  Kidney Cancer V16.51 5. Paternal history of  Prostate Cancer V16.42 6. Paternal uncle's history of  Prostate Cancer V16.42  Social History Problems  1. Activities Of Daily Living 2. Caffeine Use 1-2 qd 3. Exercise Habits No formal exercise but he is busy and active at work and with light chores. 4. Living Independently With Spouse 5. Marital History - Currently Married 6. Never A Smoker 7. Occupation: self-employed 8. Self-reliant In Usual Daily Activities Denied  9. History of  Alcohol Use 10. History  of  Tobacco Use  Review of Systems Genitourinary, constitutional, skin, eye, otolaryngeal, hematologic/lymphatic, cardiovascular, pulmonary, endocrine, musculoskeletal, gastrointestinal, neurological and psychiatric system(s) were reviewed and pertinent findings if present are noted.  Genitourinary: urinary frequency, feelings of urinary urgency, nocturia, incontinence, difficulty  starting the urinary stream, weak urinary stream, urinary stream starts and stops, incomplete emptying of bladder and erectile dysfunction, but no dysuria and no hematuria.  Constitutional: feeling tired (fatigue).  Respiratory: shortness of breath.  Endocrine: polydipsia.  Musculoskeletal: joint pain.  Neurological: headache and dizziness.  Psychiatric: anxiety.    Vitals Vital Signs [Data Includes: Last 1 Day]  15Jul2014 10:00AM  Blood Pressure: 108 / 75 Temperature: 98.1 F Heart Rate: 69  WD wn MALE IN nad Resp: Nl effort Card: RRR Abd: soft/ NT/ no masses Gu: nl external Ext: no edema Neuro: non-focal  Results/Data Urine [Data Includes: Last 1 Day]   15Jul2014 COLOR YELLOW  APPEARANCE CLEAR  SPECIFIC GRAVITY >1.030  pH 5.0  GLUCOSE > 1000 mg/dL BILIRUBIN NEG  KETONE TRACE mg/dL BLOOD NEG  PROTEIN NEG mg/dL UROBILINOGEN 0.2 mg/dL NITRITE NEG  LEUKOCYTE ESTERASE NEG   Assessment Assessed  1. Male Stress Incontinence 788.32 2. Bladder Neck Contracture 596.0 3. Prostate Cancer 185  Plan Bladder Neck Contracture (596.0)  1. Follow-up Schedule Surgery Office  Follow-up  Requested for: 15Jul2014 Prostate Cancer (185)  2. Cysto 3. PSA  Requested for: 15Jul2014  Discussion/Summary   Overall from a cancer standpoint, I have been encouraged by Mario Burns's situation. He is due for PSA testing today. He clearly has voiding dysfunction with at least moderate incontinence and evidence last time of a bladder neck contracture. At this point, we have elected to go ahead and put him to sleep downstairs to perform repeat cystoscopy and to do a balloon dilation of his bladder neck contracture. We will also carefully assess for any evidence of exposed staple and remove that if that is visualized. He is told that that may make his incontinence worse, but hopefully not and there is a chance that down the road that will improve things. Hopefully we can get his stream stronger. He  understands that scar tissue/contracture can recur. I doubly think it is important that he work to tighten up his glucose control and that certainly will help him on a number of fronts.

## 2012-08-02 NOTE — Anesthesia Postprocedure Evaluation (Signed)
  Anesthesia Post-op Note  Patient: Mario Burns  Procedure(s) Performed: Procedure(s) (LRB): CYSTOSCOPY WITH URETHRAL DILATATION, AN REMOVAL STAPLE FROM BLADDER NECK (N/A)  Patient Location: PACU  Anesthesia Type: General  Level of Consciousness: awake and alert   Airway and Oxygen Therapy: Patient Spontanous Breathing  Post-op Pain: mild  Post-op Assessment: Post-op Vital signs reviewed, Patient's Cardiovascular Status Stable, Respiratory Function Stable, Patent Airway and No signs of Nausea or vomiting  Last Vitals:  Filed Vitals:   08/02/12 1130  BP: 108/68  Pulse: 60  Temp: 36 C  Resp: 16    Post-op Vital Signs: stable   Complications: No apparent anesthesia complications

## 2012-08-03 ENCOUNTER — Encounter (HOSPITAL_BASED_OUTPATIENT_CLINIC_OR_DEPARTMENT_OTHER): Payer: Self-pay | Admitting: Urology

## 2012-12-07 ENCOUNTER — Ambulatory Visit (INDEPENDENT_AMBULATORY_CARE_PROVIDER_SITE_OTHER): Payer: Medicare Other | Admitting: General Surgery

## 2013-01-30 ENCOUNTER — Telehealth (INDEPENDENT_AMBULATORY_CARE_PROVIDER_SITE_OTHER): Payer: Self-pay | Admitting: General Surgery

## 2013-01-30 ENCOUNTER — Encounter (INDEPENDENT_AMBULATORY_CARE_PROVIDER_SITE_OTHER): Payer: Self-pay | Admitting: General Surgery

## 2013-01-30 ENCOUNTER — Ambulatory Visit (INDEPENDENT_AMBULATORY_CARE_PROVIDER_SITE_OTHER): Payer: Medicare Other | Admitting: General Surgery

## 2013-01-30 ENCOUNTER — Ambulatory Visit (INDEPENDENT_AMBULATORY_CARE_PROVIDER_SITE_OTHER): Payer: Medicare PPO | Admitting: General Surgery

## 2013-01-30 VITALS — BP 110/60 | HR 62 | Resp 16 | Ht 69.0 in | Wt 185.0 lb

## 2013-01-30 DIAGNOSIS — K432 Incisional hernia without obstruction or gangrene: Secondary | ICD-10-CM

## 2013-01-30 HISTORY — DX: Incisional hernia without obstruction or gangrene: K43.2

## 2013-01-30 NOTE — Progress Notes (Signed)
Patient ID: Mario Burns, male   DOB: 1945/07/08, 68 y.o.   MRN: [id number]  Chief Complaint  Patient presents with  . Other    Eval incisional hernia    HPI Mario Burns is a 68 y.o. male.  He is referred by Dr. Daiva Eves in [facility] for evaluation of an incisional hernia.  The patient underwent robotic prostatectomy by Dr. Risa Grill in December of 2013. That went well. About 4 months later he noticed a bulge to the left of his umbilicus there were the incisions. This is  gradually enlarging and clearly is bigger when he eats. Not much pain. No alteration in bowel habits. No imaging studies.  He states he was evaluated for what sounds like diastases recti 15 years ago.  He owns his own business, doing Geologist, engineering. Has an automobile show an April he wants to go to.  Comorbidities include robotic prostatectomy for prostate cancer by Dr. Risa Grill in December 2013. Otherwise doing well. No prior history of hernia. Last colonoscopy by Dr. Watt Climes 2 to 3 years ago. Hypertension. Non-insulin-dependent diabetes mellitus. GERD. Arthritis.  HPI  Past Medical History  Diagnosis Date  . Hypertension   . Hypothyroidism   . Shortness of breath     ALWAYS--HAD FOR YEARS - STATES HE CAN'T DO HARDLY ANYTHING WITHOUT GIVING OUT  . GERD (gastroesophageal reflux disease)   . H/O hiatal hernia   . Arthritis   . History of prostate cancer     S/P PROSTATECTOMY 2013  . OSA (obstructive sleep apnea)     NON-COMPLIANT CPAP--  CLAUSTROPHOBIC  . Type 2 diabetes mellitus   . Frequency of urination   . Urgency of urination   . Nocturia   . Neuromuscular disorder     PT STATES MYASTHENIA GRAVIS DX FEB 2013 BY DR. Joesph July STATES HE WAS HAVING PROBLEMS CHEWING CERTAIN FOODS AND UNABLE TO OPEN HIS MOUTH WIDE. - PT TOOK MEDICATION PRESCRIBED BY DR. Jannifer Franklin FOR A SHORT TIME- BUT CHEWING IMPROVED AND HE STOPPED MEDICATION BECAUSE OF THE SIDE EFFECTS.  PT STATE HE HAS NOTICED THAT HIS ISSUES HAVE  RESOLVED.  Marland Kitchen Complication of anesthesia     CLAUSTROPHOBIC    Past Surgical History  Procedure Laterality Date  . Hemorrhoid surgery  1990's  . Robot assisted laparoscopic radical prostatectomy  12/15/2011    Procedure: Mason Neck;  Surgeon: Bernestine Amass, MD;  Location: [facility];  Service: Urology;  Laterality: N/A;  . Total thyroidectomy  05-24-2003  . Cystoscopy with urethral dilatation N/A 08/02/2012    Procedure: CYSTOSCOPY WITH URETHRAL DILATATION, AN REMOVAL STAPLE FROM BLADDER NECK;  Surgeon: Bernestine Amass, MD;  Location[facility];  Service: Urology;  Laterality: N/A;    History reviewed. No pertinent family history.  Social History History  Substance Use Topics  . Smoking status: Never Smoker   . Smokeless tobacco: Never Used  . Alcohol Use: No    Allergies  Allergen Reactions  . Sulfa Antibiotics Other (See Comments)    unknown    Current Outpatient Prescriptions  Medication Sig Dispense Refill  . docusate sodium (COLACE) 100 MG capsule Take 100 mg by mouth daily.      Marland Kitchen glimepiride (AMARYL) 4 MG tablet Take 4 mg by mouth 2 (two) times daily.      Marland Kitchen levothyroxine (SYNTHROID, LEVOTHROID) 125 MCG tablet Take 125 mcg by mouth every morning.      . naproxen sodium (ANAPROX) 220 MG  tablet Take 220 mg by mouth 2 (two) times daily with a meal.      . olmesartan (BENICAR) 20 MG tablet Take 20 mg by mouth every morning.      Marland Kitchen omeprazole (PRILOSEC) 20 MG capsule Take 20 mg by mouth as needed.        No current facility-administered medications for this visit.    Review of Systems Review of Systems  Constitutional: Negative for fever, chills and unexpected weight change.  HENT: Negative for congestion, hearing loss, sore throat, trouble swallowing and voice change.   Eyes: Negative for visual disturbance.  Respiratory: Negative for cough and wheezing.   Cardiovascular: Negative for chest pain, palpitations and  leg swelling.  Gastrointestinal: Negative for nausea, vomiting, abdominal pain, diarrhea, constipation, blood in stool, abdominal distention, anal bleeding and rectal pain.  Genitourinary: Positive for urgency, enuresis and difficulty urinating. Negative for hematuria.       Wears a diaper.  Musculoskeletal: Negative for arthralgias.  Skin: Negative for rash and wound.  Neurological: Negative for seizures, syncope, weakness and headaches.  Hematological: Negative for adenopathy. Does not bruise/bleed easily.  Psychiatric/Behavioral: Negative for confusion.    Blood pressure 110/60, pulse 62, resp. rate 16, height 5\' 9"  (1.753 m), weight 185 lb (83.915 kg).  Physical Exam Physical Exam  Constitutional: He is oriented to person, place, and time. He appears well-developed and well-nourished. No distress.  HENT:  Head: Normocephalic.  Nose: Nose normal.  Mouth/Throat: No oropharyngeal exudate.  Eyes: Conjunctivae and EOM are normal. Pupils are equal, round, and reactive to light. Right eye exhibits no discharge. Left eye exhibits no discharge. No scleral icterus.  Neck: Normal range of motion. Neck supple. No JVD present. No tracheal deviation present. No thyromegaly present.  Cardiovascular: Normal rate, regular rhythm, normal heart sounds and intact distal pulses.   No murmur heard. Pulmonary/Chest: Effort normal and breath sounds normal. No stridor. No respiratory distress. He has no wheezes. He has no rales. He exhibits no tenderness.  Abdominal: Soft. Bowel sounds are normal. He exhibits mass. He exhibits no distension. There is no tenderness. There is no rebound and no guarding.    There is a vertical scar to the left of the umbilicus. There is a small reducible hernia which is detectable supine or standing. It does not appear tender. No hernia elsewhere in the abdomen. He does have a component of diastasis recti. No inguinal hernias detected.  Musculoskeletal: Normal range of motion.  He exhibits no edema and no tenderness.  Lymphadenopathy:    He has no cervical adenopathy.  Neurological: He is alert and oriented to person, place, and time. He has normal reflexes. Coordination normal.  Skin: Skin is warm and dry. No rash noted. He is not diaphoretic. No erythema. No pallor.  Psychiatric: He has a normal mood and affect. His behavior is normal. Judgment and thought content normal.    Data Reviewed Dr. Onnie Boer office notes  Assessment    Ventral incisional hernia. 2 left of umbilicus, relatively small. Good candidate for laparoscopic repair with mesh  Status post robotic prostatectomy for prostate cancer  Non-insulin-dependent diabetes mellitus  Hypertension  GERD  Arthritis     Plan    We had a long discussion about his hernia. Its size, natural history, risks of surgery, risk of not doing surgery. We talked about  laparoscopic and open techniques. He said he would like to have this repaired sometime this spring.  He will be scheduled electively for laparoscopic repair  of ventral incisional hernia with mesh, possible open at his convenience  I discussed the indications, details, techniques, and numerous risk of the surgery with him and his wife. He is aware of the risk of bleeding, infection, recurrence, injury to adjacent organs with major reconstructive surgery. He understands all these issues. All his questions were answered. He agrees with this plan.        Braelynn Benning M 01/30/2013, 10:49 AM

## 2013-01-30 NOTE — Patient Instructions (Signed)
You have a small incisional hernia to the left of your umbilicus were you had the robotic prostatectomy. This will likely progress over time, although there is no emergency at this moment.  At your request, you will be scheduled for laparoscopic repair of your incisional hernia with mesh, possible open repair at your convenience.    Laparoscopic Ventral Hernia Repair Laparoscopic ventral hernia repairis a surgery to fix a ventral hernia. Aventral hernia, also called an incisional hernia, is a bulge of body tissue or intestines that pushes through the front part of the abdomen. This can happen if the connective tissue covering the muscles over the abdomen has a weak spot or is torn because of a surgical cut (incision) from a previous surgery. Laparoscopic ventral hernia repair is often done soon after diagnosis to stop the hernia from getting bigger, becoming uncomfortable, or becoming an emergency. This surgery usually takes about 2 hours, but the time can vary greatly. LET YOUR CAREGIVER KNOW ABOUT:  Any allergies you have.  All medicines you are taking, including steroids, vitamins, herbs, eyedrops, and over-the-counter medicines and creams.  Previous problems you or members of your family have had with the use of anesthetics.  Any blood disorders or bleeding problems you have had.  Past surgeries you have had.  Other health problems you have. RISKS AND COMPLICATIONS  Generally, laparoscopic ventral hernia repair is a safe procedure. However, as with any surgical procedure, complications can occur. Possible complications include:  Bleeding.  Trouble passing urine or having a bowel movement after the operation.  Infection.  Pneumonia.  Blood clots.  Pain in the area of the hernia.  A bulge in the area of the hernia that may be caused by a collection of fluid.  Injury to intestines or other structures in the abdomen.  Return of the hernia after surgery. In some cases, the  caregiver may need to stop the laparoscopic procedure and do regular, open surgery. This may be necessary for very difficult hernias, when organs are hard to see, or when bleeding problems occur during surgery. BEFORE THE PROCEDURE   You may need to have blood tests, urine tests, a chest X-ray, or electrocardiography done before the day of the surgery.  Ask your caregiver about changing or stopping your regular medicines.  You may need to wash with a special type of germ-killing soap.  Do not eat or drink anything for at least 6 hours before the surgery.  Make plans to have someone drive you home after the procedure. PROCEDURE   Small monitors will be put on your body. They are used to check your heart, blood pressure, and oxygen level.  An intravenous (IV) access tube will be put into a vein in your hand or arm. Fluids and medicine will flow directly into your body through the IV tube.  You will be given medicine to make you sleep through the procedure (general anesthetic).  Your abdomen will be cleaned with a special soap to kill any germs on your skin.  Once you are asleep, several small incisions will be made in your abdomen.  The large space in your abdomen will be filled with air so that it expands. This gives the caregiver more room and a better view.  A thin, lighted tube with a tiny camera on the end (laparoscope) is put through a small incision in your abdomen. The camera on the laparoscope sends a picture to a TV screen in the operating room. This gives the caregiver a  good view inside the abdomen.  Hollow tubes are put through the other small incisions in your abdomen. The tools needed for the procedure are put through these tubes.  The caregiver puts the tissue or intestines that formed the hernia back in place.  A screen-like patch (mesh) is used to close the hernia. This helps make the area stronger. Stitches, tacks, or staples are used to keep the mesh in  place.  Medicine and a bandage (dressing) or skin glue will be put over the incisions. AFTER THE PROCEDURE   You will stay in a recovery area until the anesthetic wears off. Your blood pressure and pulse will be checked often.  You may be able to go home the same day or may need to stay in the hospital for 1 or 2 days after this surgery. Your caregiver will decide when you can go home.  You may feel some pain. You will likely be given medicine for pain.  You will be urged to do breathing exercises that involve taking deep breaths. This helps prevent a lung infection after a surgery.  You may have to wear compression stockings while you are in the hospital. These stockings help keep blood clots from forming in your legs. Document Released: 12/08/2011 Document Reviewed: 12/08/2011 [facility] Patient Information 2014 [place], [place].

## 2013-01-30 NOTE — Telephone Encounter (Signed)
01/30/13 patient wants to wait until April to schedule surgery, face sheet placed in pending for April

## 2013-04-06 ENCOUNTER — Encounter (HOSPITAL_COMMUNITY): Payer: Self-pay | Admitting: Pharmacy Technician

## 2013-04-13 NOTE — Patient Instructions (Addendum)
Mario Burns  04/13/2013   Your procedure is scheduled on:  04/23/13  [id number]  Report to [facility] at    0500  AM.  Call this number if you have problems the morning of surgery: [phone]   Remember:             Eat a good healthy snack prior to bedtime.    Do not eat food or drink liquids after midnight.   Take these medicines the morning of surgery with A SIP OF WATER: levothyroxin, prilosec    Do not wear jewelry,   Do not wear lotions, powders, or perfumes.   Men may shave face and neck.  Do not bring valuables to the hospital.  Contacts, dentures or bridgework may not be worn into surgery.  Leave suitcase in the car. After surgery it may be brought to your room.  For patients admitted to the hospital, checkout time is 11:00 AM the day of discharge.        [facility] - Preparing for Surgery Before surgery, you can play an important role.  Because skin is not sterile, your skin needs to be as free of germs as possible.  You can reduce the number of germs on your skin by washing with CHG (chlorahexidine gluconate) soap before surgery.  CHG is an antiseptic cleaner which kills germs and bonds with the skin to continue killing germs even after washing. Please DO NOT use if you have an allergy to CHG or antibacterial soaps.  If your skin becomes reddened/irritated stop using the CHG and inform your nurse when you arrive at Short Stay. Do not shave (including legs and underarms) for at least 48 hours prior to the first CHG shower.  You may shave your face. Please follow these instructions carefully:  1.  Shower with CHG Soap the night before surgery and the  morning of Surgery.  2.  If you choose to wash your hair, wash your hair first as usual with your  normal  shampoo.  3.  After you shampoo, rinse your hair and body thoroughly to remove the  shampoo.                            4.  Use CHG as you would any other liquid soap.  You can apply chg directly  to  the skin and wash                       Gently with a scrungie or clean washcloth.  5.  Apply the CHG Soap to your body ONLY FROM THE NECK DOWN.   Do not use on open                           Wound or open sores. Avoid contact with eyes, ears mouth and genitals (private parts).                        Genitals (private parts) with your normal soap.             6.  Wash thoroughly, paying special attention to the area where your surgery  will be performed.  7.  Thoroughly rinse your body with warm water from the neck down.  8.  DO NOT shower/wash with your normal soap after using and rinsing off  the CHG  Soap.                9.  Pat yourself dry with a clean towel.            10.  Wear clean pajamas.            11.  Place clean sheets on your bed the night of your first shower and do not  sleep with pets. Day of Surgery : Do not apply any lotions/deodorants the morning of surgery.  Please wear clean clothes to the hospital/surgery center.  FAILURE TO FOLLOW THESE INSTRUCTIONS MAY RESULT IN THE CANCELLATION OF YOUR SURGERY PATIENT SIGNATURE_________________________________  NURSE SIGNATURE__________________________________

## 2013-04-16 ENCOUNTER — Ambulatory Visit (HOSPITAL_COMMUNITY)
Admission: RE | Admit: 2013-04-16 | Discharge: 2013-04-16 | Disposition: A | Discharge status: Home / Self Care | Payer: Medicare PPO | Source: Ambulatory Visit | Attending: General Surgery | Admitting: General Surgery

## 2013-04-16 ENCOUNTER — Encounter (HOSPITAL_COMMUNITY)
Admission: RE | Admit: 2013-04-16 | Discharge: 2013-04-16 | Disposition: A | Discharge status: Home / Self Care | Payer: Medicare PPO | Source: Ambulatory Visit | Attending: General Surgery | Admitting: General Surgery

## 2013-04-16 ENCOUNTER — Encounter (HOSPITAL_COMMUNITY): Payer: Self-pay

## 2013-04-16 DIAGNOSIS — Z01810 Encounter for preprocedural cardiovascular examination: Secondary | ICD-10-CM | POA: Insufficient documentation

## 2013-04-16 DIAGNOSIS — I10 Essential (primary) hypertension: Secondary | ICD-10-CM | POA: Insufficient documentation

## 2013-04-16 DIAGNOSIS — Z01818 Encounter for other preprocedural examination: Secondary | ICD-10-CM | POA: Insufficient documentation

## 2013-04-16 DIAGNOSIS — Z01812 Encounter for preprocedural laboratory examination: Secondary | ICD-10-CM | POA: Insufficient documentation

## 2013-04-16 HISTORY — DX: Claustrophobia: F40.240

## 2013-04-16 LAB — CBC WITH DIFFERENTIAL/PLATELET
Basophils Absolute: 0 10*3/uL (ref 0.0–0.1)
Basophils Relative: 0 % (ref 0–1)
EOS PCT: 2 % (ref 0–5)
Eosinophils Absolute: 0.2 10*3/uL (ref 0.0–0.7)
HEMATOCRIT: 43.4 % (ref 39.0–52.0)
Hemoglobin: 14.8 g/dL (ref 13.0–17.0)
LYMPHS ABS: 3 10*3/uL (ref 0.7–4.0)
Lymphocytes Relative: 35 % (ref 12–46)
MCH: 29.8 pg (ref 26.0–34.0)
MCHC: 34.1 g/dL (ref 30.0–36.0)
MCV: 87.3 fL (ref 78.0–100.0)
Monocytes Absolute: 0.6 10*3/uL (ref 0.1–1.0)
Monocytes Relative: 8 % (ref 3–12)
NEUTROS ABS: 4.8 10*3/uL (ref 1.7–7.7)
Neutrophils Relative %: 56 % (ref 43–77)
Platelets: 258 10*3/uL (ref 150–400)
RBC: 4.97 MIL/uL (ref 4.22–5.81)
RDW: 12.6 % (ref 11.5–15.5)
WBC: 8.5 10*3/uL (ref 4.0–10.5)

## 2013-04-16 LAB — COMPREHENSIVE METABOLIC PANEL
ALK PHOS: 76 U/L (ref 39–117)
ALT: 23 U/L (ref 0–53)
AST: 17 U/L (ref 0–37)
Albumin: 4 g/dL (ref 3.5–5.2)
BUN: 14 mg/dL (ref 6–23)
CALCIUM: 9.2 mg/dL (ref 8.4–10.5)
CHLORIDE: 99 meq/L (ref 96–112)
CO2: 24 meq/L (ref 19–32)
Creatinine, Ser: 0.69 mg/dL (ref 0.50–1.35)
GFR calc Af Amer: >90 mL/min (ref >90)
GLUCOSE: 277 mg/dL — AB (ref 70–99)
POTASSIUM: 4.3 meq/L (ref 3.7–5.3)
SODIUM: 134 meq/L — AB (ref 137–147)
Total Bilirubin: 0.5 mg/dL (ref 0.3–1.2)
Total Protein: 6.8 g/dL (ref 6.0–8.3)

## 2013-04-16 LAB — URINALYSIS, ROUTINE W REFLEX MICROSCOPIC
BILIRUBIN URINE: NEGATIVE
Glucose, UA: >1000 mg/dL — AB
HGB URINE DIPSTICK: NEGATIVE
Ketones, ur: NEGATIVE mg/dL
Leukocytes, UA: NEGATIVE
Nitrite: NEGATIVE
Protein, ur: NEGATIVE mg/dL
Specific Gravity, Urine: 1.029 (ref 1.005–1.030)
UROBILINOGEN UA: 0.2 mg/dL (ref 0.0–1.0)
pH: 5 (ref 5.0–8.0)

## 2013-04-16 LAB — URINE MICROSCOPIC-ADD ON

## 2013-04-16 NOTE — Progress Notes (Signed)
Surgery clearance note Dr. Percival Spanish on chart

## 2013-04-18 NOTE — H&P (Signed)
TOMOYA RINGWALD   MRN:  [id number]   Description: 68 year old male  Provider: [name], MD  Department: [facility]             Diagnoses      Incisional hernia, without obstruction or gangrene    -  Primary      553.21                 Current Vitals Most recent update: 01/30/2013  9:31 AM by Illene Regulus, MA      BP Pulse Resp Ht Wt BMI      110/60 62 16 5\' 9"  (1.753 m) 185 lb (83.915 kg) 27.31 kg/m2           History and Physical     [name], MD       Status: Signed            Patient ID: Melton Alar, male   DOB: [birth year], 68 y.o.   MRN: [id number]                  HPI DINK CREPS is a 68 y.o. male.  He is referred by Dr. Daiva Eves in [facility] for evaluation of an incisional hernia.   The patient underwent robotic prostatectomy by Dr. Risa Grill in December of 2013. That went well. About 4 months later he noticed a bulge to the left of his umbilicus there were the incisions. This is  gradually enlarging and clearly is bigger when he eats. Not much pain. No alteration in bowel habits. No imaging studies.   He states he was evaluated for what sounds like diastases recti 15 years ago.   He owns his own business, doing Geologist, engineering. Has an automobile show an April he wants to go to.   Comorbidities include robotic prostatectomy for prostate cancer by Dr. Risa Grill in December 2013. Otherwise doing well. No prior history of hernia. Last colonoscopy by Dr. Watt Climes 2 to 3 years ago. Hypertension. Non-insulin-dependent diabetes mellitus. GERD. Arthritis.         Past Medical History   Diagnosis  Date   .  Hypertension     .  Hypothyroidism     .  Shortness of breath         ALWAYS--HAD FOR YEARS - STATES HE CAN'T DO HARDLY ANYTHING WITHOUT GIVING OUT   .  GERD (gastroesophageal reflux disease)     .  H/O hiatal hernia     .  Arthritis     .  History of prostate cancer         S/P PROSTATECTOMY 2013   .  OSA  (obstructive sleep apnea)         NON-COMPLIANT CPAP--  CLAUSTROPHOBIC   .  Type 2 diabetes mellitus     .  Frequency of urination     .  Urgency of urination     .  Nocturia     .  Neuromuscular disorder         PT STATES MYASTHENIA GRAVIS DX FEB 2013 BY DR. Joesph July STATES HE WAS HAVING PROBLEMS CHEWING CERTAIN FOODS AND UNABLE TO OPEN HIS MOUTH WIDE. - PT TOOK MEDICATION PRESCRIBED BY DR. Jannifer Franklin FOR A SHORT TIME- BUT CHEWING IMPROVED AND HE STOPPED MEDICATION BECAUSE OF THE SIDE EFFECTS.  PT STATE HE HAS NOTICED THAT HIS ISSUES HAVE RESOLVED.   Marland Kitchen  Complication of anesthesia  CLAUSTROPHOBIC         Past Surgical History   Procedure  Laterality  Date   .  Hemorrhoid surgery    1990's   .  Robot assisted laparoscopic radical prostatectomy    12/15/2011       Procedure: [place];  Surgeon: Bernestine Amass, MD;  Location: [facility];  Service: Urology;  Laterality: N/A;   .  Total thyroidectomy    05-24-2003   .  Cystoscopy with urethral dilatation  N/A  08/02/2012       Procedure: CYSTOSCOPY WITH URETHRAL DILATATION, AN REMOVAL STAPLE FROM BLADDER NECK;  Surgeon: Bernestine Amass, MD;  Location: [facility];  Service: Urology;  Laterality: N/A;        History reviewed. No pertinent family history.   Social History History   Substance Use Topics   .  Smoking status:  Never Smoker    .  Smokeless tobacco:  Never Used   .  Alcohol Use:  No         Allergies   Allergen  Reactions   .  Sulfa Antibiotics  Other (See Comments)       unknown         Current Outpatient Prescriptions   Medication  Sig  Dispense  Refill   .  docusate sodium (COLACE) 100 MG capsule  Take 100 mg by mouth daily.         Marland Kitchen  glimepiride (AMARYL) 4 MG tablet  Take 4 mg by mouth 2 (two) times daily.         Marland Kitchen  levothyroxine (SYNTHROID, LEVOTHROID) 125 MCG tablet  Take 125 mcg by mouth every morning.         .  naproxen sodium (ANAPROX) 220  MG tablet  Take 220 mg by mouth 2 (two) times daily with a meal.         .  olmesartan (BENICAR) 20 MG tablet  Take 20 mg by mouth every morning.         Marland Kitchen  omeprazole (PRILOSEC) 20 MG capsule  Take 20 mg by mouth as needed.                    Review of Systems   Constitutional: Negative for fever, chills and unexpected weight change.  HENT: Negative for congestion, hearing loss, sore throat, trouble swallowing and voice change.   Eyes: Negative for visual disturbance.  Respiratory: Negative for cough and wheezing.   Cardiovascular: Negative for chest pain, palpitations and leg swelling.  Gastrointestinal: Negative for nausea, vomiting, abdominal pain, diarrhea, constipation, blood in stool, abdominal distention, anal bleeding and rectal pain.  Genitourinary: Positive for urgency, enuresis and difficulty urinating. Negative for hematuria.        Wears a diaper.  Musculoskeletal: Negative for arthralgias.  Skin: Negative for rash and wound.  Neurological: Negative for seizures, syncope, weakness and headaches.  Hematological: Negative for adenopathy. Does not bruise/bleed easily.  Psychiatric/Behavioral: Negative for confusion.      Blood pressure 110/60, pulse 62, resp. rate 16, height 5\' 9"  (1.753 m), weight 185 lb (83.915 kg).   Physical Exam   Constitutional: He is oriented to person, place, and time. He appears well-developed and well-nourished. No distress.  HENT:   Head: Normocephalic.   Nose: Nose normal.   Mouth/Throat: No oropharyngeal exudate.  Eyes: Conjunctivae and EOM are normal. Pupils are equal, round, and reactive to light. Right eye exhibits no  discharge. Left eye exhibits no discharge. No scleral icterus.  Neck: Normal range of motion. Neck supple. No JVD present. No tracheal deviation present. No thyromegaly present.  Cardiovascular: Normal rate, regular rhythm, normal heart sounds and intact distal pulses.    No murmur heard. Pulmonary/Chest: Effort  normal and breath sounds normal. No stridor. No respiratory distress. He has no wheezes. He has no rales. He exhibits no tenderness.  Abdominal: Soft. Bowel sounds are normal. He exhibits mass. He exhibits no distension. There is no tenderness. There is no rebound and no guarding.    There is a vertical scar to the left of the umbilicus. There is a small reducible hernia which is detectable supine or standing. It does not appear tender. No hernia elsewhere in the abdomen. He does have a component of diastasis recti. No inguinal hernias detected.  Musculoskeletal: Normal range of motion. He exhibits no edema and no tenderness.  Lymphadenopathy:    He has no cervical adenopathy.  Neurological: He is alert and oriented to person, place, and time. He has normal reflexes. Coordination normal.  Skin: Skin is warm and dry. No rash noted. He is not diaphoretic. No erythema. No pallor.  Psychiatric: He has a normal mood and affect. His behavior is normal. Judgment and thought content normal.      Data Reviewed Dr. Onnie Boer office notes   Assessment    Ventral incisional hernia. To the left of umbilicus, relatively small. Good candidate for laparoscopic repair with mesh   Status post robotic prostatectomy for prostate cancer   Non-insulin-dependent diabetes mellitus   Hypertension   GERD   Arthritis      Plan    We had a long discussion about his hernia. Its size, natural history, risks of surgery, risk of not doing surgery. We talked about  laparoscopic and open techniques. He said he would like to have this repaired sometime this spring.   He will be scheduled electively for laparoscopic repair of ventral incisional hernia with mesh, possible open at his convenience   I discussed the indications, details, techniques, and numerous risk of the surgery with him and his wife. He is aware of the risk of bleeding, infection, recurrence, injury to adjacent organs with major  reconstructive surgery. He understands all these issues. All his questions were answered. He agrees with this plan.           [name]. [name], M.D., [facility] General and Minimally invasive Surgery Breast and Colorectal Surgery Office:   [phone] Pager:   [phone]

## 2013-04-23 ENCOUNTER — Inpatient Hospital Stay (HOSPITAL_COMMUNITY)
Admission: RE | Admit: 2013-04-23 | Discharge: 2013-04-26 | DRG: 355 | Disposition: A | Discharge status: Home / Self Care | Payer: Medicare PPO | Source: Ambulatory Visit | Attending: General Surgery | Admitting: General Surgery

## 2013-04-23 ENCOUNTER — Encounter (HOSPITAL_COMMUNITY): Payer: Self-pay

## 2013-04-23 ENCOUNTER — Encounter (HOSPITAL_COMMUNITY): Payer: Medicare PPO | Admitting: Certified Registered Nurse Anesthetist

## 2013-04-23 ENCOUNTER — Ambulatory Visit (HOSPITAL_COMMUNITY): Payer: Medicare PPO | Admitting: Certified Registered Nurse Anesthetist

## 2013-04-23 ENCOUNTER — Encounter (HOSPITAL_COMMUNITY)
Admission: RE | Disposition: A | Discharge status: Home / Self Care | Payer: Self-pay | Source: Ambulatory Visit | Attending: General Surgery

## 2013-04-23 DIAGNOSIS — Z9119 Patient's noncompliance with other medical treatment and regimen: Secondary | ICD-10-CM

## 2013-04-23 DIAGNOSIS — I10 Essential (primary) hypertension: Secondary | ICD-10-CM | POA: Diagnosis present

## 2013-04-23 DIAGNOSIS — Z91199 Patient's noncompliance with other medical treatment and regimen due to unspecified reason: Secondary | ICD-10-CM

## 2013-04-23 DIAGNOSIS — K430 Incisional hernia with obstruction, without gangrene: Secondary | ICD-10-CM

## 2013-04-23 DIAGNOSIS — R351 Nocturia: Secondary | ICD-10-CM | POA: Diagnosis present

## 2013-04-23 DIAGNOSIS — R109 Unspecified abdominal pain: Secondary | ICD-10-CM | POA: Diagnosis not present

## 2013-04-23 DIAGNOSIS — Z8546 Personal history of malignant neoplasm of prostate: Secondary | ICD-10-CM

## 2013-04-23 DIAGNOSIS — E039 Hypothyroidism, unspecified: Secondary | ICD-10-CM | POA: Diagnosis present

## 2013-04-23 DIAGNOSIS — IMO0002 Reserved for concepts with insufficient information to code with codable children: Secondary | ICD-10-CM | POA: Diagnosis not present

## 2013-04-23 DIAGNOSIS — M129 Arthropathy, unspecified: Secondary | ICD-10-CM | POA: Diagnosis present

## 2013-04-23 DIAGNOSIS — R1310 Dysphagia, unspecified: Secondary | ICD-10-CM | POA: Diagnosis not present

## 2013-04-23 DIAGNOSIS — N9989 Other postprocedural complications and disorders of genitourinary system: Secondary | ICD-10-CM | POA: Diagnosis not present

## 2013-04-23 DIAGNOSIS — R339 Retention of urine, unspecified: Secondary | ICD-10-CM | POA: Diagnosis not present

## 2013-04-23 DIAGNOSIS — G4733 Obstructive sleep apnea (adult) (pediatric): Secondary | ICD-10-CM | POA: Diagnosis present

## 2013-04-23 DIAGNOSIS — E119 Type 2 diabetes mellitus without complications: Secondary | ICD-10-CM | POA: Diagnosis present

## 2013-04-23 DIAGNOSIS — Y839 Surgical procedure, unspecified as the cause of abnormal reaction of the patient, or of later complication, without mention of misadventure at the time of the procedure: Secondary | ICD-10-CM | POA: Diagnosis not present

## 2013-04-23 DIAGNOSIS — F40298 Other specified phobia: Secondary | ICD-10-CM | POA: Diagnosis present

## 2013-04-23 DIAGNOSIS — F411 Generalized anxiety disorder: Secondary | ICD-10-CM | POA: Diagnosis not present

## 2013-04-23 DIAGNOSIS — Z79899 Other long term (current) drug therapy: Secondary | ICD-10-CM

## 2013-04-23 DIAGNOSIS — N320 Bladder-neck obstruction: Secondary | ICD-10-CM | POA: Diagnosis present

## 2013-04-23 DIAGNOSIS — Z9079 Acquired absence of other genital organ(s): Secondary | ICD-10-CM

## 2013-04-23 DIAGNOSIS — Z881 Allergy status to other antibiotic agents status: Secondary | ICD-10-CM

## 2013-04-23 DIAGNOSIS — K432 Incisional hernia without obstruction or gangrene: Secondary | ICD-10-CM | POA: Diagnosis present

## 2013-04-23 DIAGNOSIS — R0602 Shortness of breath: Secondary | ICD-10-CM | POA: Diagnosis present

## 2013-04-23 DIAGNOSIS — Z882 Allergy status to sulfonamides status: Secondary | ICD-10-CM

## 2013-04-23 DIAGNOSIS — K436 Other and unspecified ventral hernia with obstruction, without gangrene: Secondary | ICD-10-CM

## 2013-04-23 DIAGNOSIS — R12 Heartburn: Secondary | ICD-10-CM | POA: Diagnosis present

## 2013-04-23 DIAGNOSIS — G7000 Myasthenia gravis without (acute) exacerbation: Secondary | ICD-10-CM | POA: Diagnosis present

## 2013-04-23 DIAGNOSIS — K219 Gastro-esophageal reflux disease without esophagitis: Secondary | ICD-10-CM | POA: Diagnosis present

## 2013-04-23 HISTORY — DX: Incisional hernia with obstruction, without gangrene: K43.0

## 2013-04-23 HISTORY — PX: INCISIONAL HERNIA REPAIR: SHX193

## 2013-04-23 HISTORY — PX: INSERTION OF MESH: SHX5868

## 2013-04-23 LAB — CBC
HCT: 37.3 % — ABNORMAL LOW (ref 39.0–52.0)
Hemoglobin: 12.9 g/dL — ABNORMAL LOW (ref 13.0–17.0)
MCH: 30.6 pg (ref 26.0–34.0)
MCHC: 34.6 g/dL (ref 30.0–36.0)
MCV: 88.6 fL (ref 78.0–100.0)
Platelets: 217 10*3/uL (ref 150–400)
RBC: 4.21 MIL/uL — AB (ref 4.22–5.81)
RDW: 13 % (ref 11.5–15.5)
WBC: 14.9 10*3/uL — AB (ref 4.0–10.5)

## 2013-04-23 LAB — GLUCOSE, CAPILLARY
GLUCOSE-CAPILLARY: 206 mg/dL — AB (ref 70–99)
Glucose-Capillary: 186 mg/dL — ABNORMAL HIGH (ref 70–99)
Glucose-Capillary: 192 mg/dL — ABNORMAL HIGH (ref 70–99)
Glucose-Capillary: 212 mg/dL — ABNORMAL HIGH (ref 70–99)
Glucose-Capillary: 138 mg/dL — ABNORMAL HIGH (ref 70–99)

## 2013-04-23 LAB — CREATININE, SERUM
CREATININE: 0.79 mg/dL (ref 0.50–1.35)
GFR calc non Af Amer: >90 mL/min (ref >90)

## 2013-04-23 SURGERY — REPAIR, HERNIA, INCISIONAL, LAPAROSCOPIC
Anesthesia: General | Site: Abdomen

## 2013-04-23 MED ORDER — ONDANSETRON HCL 4 MG/2ML IJ SOLN: 4.0000 mg | Freq: Four times a day (QID) | INTRAMUSCULAR | Status: DC | PRN

## 2013-04-23 MED ORDER — HYDROMORPHONE HCL PF 1 MG/ML IJ SOLN
INTRAMUSCULAR | Status: AC
  Administered 2013-04-23: 1 mg via INTRAVENOUS
  Filled 2013-04-23: qty 1

## 2013-04-23 MED ORDER — ENOXAPARIN SODIUM 40 MG/0.4ML ~~LOC~~ SOLN
40.0000 mg | SUBCUTANEOUS | Status: DC
  Administered 2013-04-24 – 2013-04-26 (×3): 40 mg via SUBCUTANEOUS
  Filled 2013-04-23 (×4): qty 0.4

## 2013-04-23 MED ORDER — LACTATED RINGERS IV SOLN
INTRAVENOUS | Status: DC | PRN
  Administered 2013-04-23 (×2): via INTRAVENOUS

## 2013-04-23 MED ORDER — GLYCOPYRROLATE 0.2 MG/ML IJ SOLN
INTRAMUSCULAR | Status: AC
  Filled 2013-04-23: qty 1

## 2013-04-23 MED ORDER — DOCUSATE SODIUM 100 MG PO CAPS
100.0000 mg | ORAL_CAPSULE | Freq: Every day | ORAL | Status: DC
  Administered 2013-04-23 – 2013-04-25 (×3): 100 mg via ORAL
  Filled 2013-04-23 (×4): qty 1

## 2013-04-23 MED ORDER — FENTANYL CITRATE 0.05 MG/ML IJ SOLN
INTRAMUSCULAR | Status: DC | PRN
  Administered 2013-04-23: 100 ug via INTRAVENOUS
  Administered 2013-04-23: 50 ug via INTRAVENOUS
  Administered 2013-04-23: 100 ug via INTRAVENOUS

## 2013-04-23 MED ORDER — BUPIVACAINE-EPINEPHRINE PF 0.5-1:200000 % IJ SOLN
INTRAMUSCULAR | Status: AC
  Filled 2013-04-23: qty 30

## 2013-04-23 MED ORDER — FENTANYL CITRATE 0.05 MG/ML IJ SOLN
INTRAMUSCULAR | Status: AC
  Filled 2013-04-23: qty 5

## 2013-04-23 MED ORDER — NEOSTIGMINE METHYLSULFATE 1 MG/ML IJ SOLN
INTRAMUSCULAR | Status: DC | PRN
  Administered 2013-04-23 (×2): 3 mg via INTRAVENOUS

## 2013-04-23 MED ORDER — MIDAZOLAM HCL 5 MG/5ML IJ SOLN
INTRAMUSCULAR | Status: DC | PRN
  Administered 2013-04-23: 2 mg via INTRAVENOUS

## 2013-04-23 MED ORDER — POTASSIUM CHLORIDE IN NACL 20-0.9 MEQ/L-% IV SOLN
INTRAVENOUS | Status: DC
  Administered 2013-04-23 – 2013-04-25 (×4): via INTRAVENOUS
  Filled 2013-04-23 (×5): qty 1000

## 2013-04-23 MED ORDER — HYDROMORPHONE HCL PF 1 MG/ML IJ SOLN
INTRAMUSCULAR | Status: AC
  Filled 2013-04-23: qty 1

## 2013-04-23 MED ORDER — PROMETHAZINE HCL 25 MG/ML IJ SOLN: 6.2500 mg | INTRAMUSCULAR | Status: DC | PRN

## 2013-04-23 MED ORDER — HYDROMORPHONE HCL PF 1 MG/ML IJ SOLN
0.2500 mg | INTRAMUSCULAR | Status: DC | PRN
  Administered 2013-04-23 (×3): 0.5 mg via INTRAVENOUS

## 2013-04-23 MED ORDER — SUCCINYLCHOLINE CHLORIDE 20 MG/ML IJ SOLN
INTRAMUSCULAR | Status: DC | PRN
  Administered 2013-04-23: 100 mg via INTRAVENOUS

## 2013-04-23 MED ORDER — EPHEDRINE SULFATE 50 MG/ML IJ SOLN
INTRAMUSCULAR | Status: DC | PRN
  Administered 2013-04-23 (×2): 10 mg via INTRAVENOUS

## 2013-04-23 MED ORDER — EPHEDRINE SULFATE 50 MG/ML IJ SOLN
INTRAMUSCULAR | Status: AC
  Filled 2013-04-23: qty 1

## 2013-04-23 MED ORDER — BISACODYL 5 MG PO TBEC
5.0000 mg | DELAYED_RELEASE_TABLET | Freq: Every day | ORAL | Status: DC | PRN
  Administered 2013-04-24: 5 mg via ORAL
  Filled 2013-04-23: qty 1

## 2013-04-23 MED ORDER — CEFAZOLIN SODIUM-DEXTROSE 2-3 GM-% IV SOLR
INTRAVENOUS | Status: AC
  Filled 2013-04-23: qty 50

## 2013-04-23 MED ORDER — PANTOPRAZOLE SODIUM 40 MG PO TBEC
40.0000 mg | DELAYED_RELEASE_TABLET | Freq: Every day | ORAL | Status: DC
  Administered 2013-04-24: 40 mg via ORAL
  Filled 2013-04-23 (×2): qty 1

## 2013-04-23 MED ORDER — LIDOCAINE HCL (CARDIAC) 20 MG/ML IV SOLN
INTRAVENOUS | Status: DC | PRN
  Administered 2013-04-23: 80 mg via INTRAVENOUS

## 2013-04-23 MED ORDER — BUPIVACAINE-EPINEPHRINE PF 0.5-1:200000 % IJ SOLN
INTRAMUSCULAR | Status: DC | PRN
  Administered 2013-04-23: 15 mL

## 2013-04-23 MED ORDER — IRBESARTAN 75 MG PO TABS
75.0000 mg | ORAL_TABLET | Freq: Every day | ORAL | Status: DC
  Administered 2013-04-23 – 2013-04-25 (×3): 75 mg via ORAL
  Filled 2013-04-23 (×4): qty 1

## 2013-04-23 MED ORDER — LEVOTHYROXINE SODIUM 125 MCG PO TABS
125.0000 ug | ORAL_TABLET | Freq: Every day | ORAL | Status: DC
  Administered 2013-04-24 – 2013-04-25 (×2): 125 ug via ORAL
  Filled 2013-04-23 (×4): qty 1

## 2013-04-23 MED ORDER — ONDANSETRON HCL 4 MG PO TABS: 4.0000 mg | ORAL_TABLET | Freq: Four times a day (QID) | ORAL | Status: DC | PRN

## 2013-04-23 MED ORDER — ALFUZOSIN HCL ER 10 MG PO TB24
10.0000 mg | ORAL_TABLET | Freq: Every day | ORAL | Status: DC
  Administered 2013-04-23 – 2013-04-25 (×2): 10 mg via ORAL
  Filled 2013-04-23 (×4): qty 1

## 2013-04-23 MED ORDER — INSULIN ASPART 100 UNIT/ML ~~LOC~~ SOLN
0.0000 [IU] | Freq: Three times a day (TID) | SUBCUTANEOUS | Status: DC
  Administered 2013-04-23: 5 [IU] via SUBCUTANEOUS
  Administered 2013-04-23: 4 [IU] via SUBCUTANEOUS
  Administered 2013-04-24 (×2): 5 [IU] via SUBCUTANEOUS
  Administered 2013-04-24 – 2013-04-25 (×2): 3 [IU] via SUBCUTANEOUS
  Administered 2013-04-25 (×2): 2 [IU] via SUBCUTANEOUS

## 2013-04-23 MED ORDER — POTASSIUM CHLORIDE IN NACL 20-0.9 MEQ/L-% IV SOLN
INTRAVENOUS | Status: AC
  Filled 2013-04-23: qty 1000

## 2013-04-23 MED ORDER — ONDANSETRON HCL 4 MG/2ML IJ SOLN
INTRAMUSCULAR | Status: DC | PRN
  Administered 2013-04-23: 4 mg via INTRAVENOUS

## 2013-04-23 MED ORDER — PROPOFOL 10 MG/ML IV BOLUS
INTRAVENOUS | Status: AC
Start: 2013-04-23 — End: 2013-04-23
  Filled 2013-04-23: qty 20

## 2013-04-23 MED ORDER — ATROPINE SULFATE 0.4 MG/ML IJ SOLN
INTRAMUSCULAR | Status: AC
  Filled 2013-04-23: qty 1

## 2013-04-23 MED ORDER — HYDROMORPHONE HCL PF 1 MG/ML IJ SOLN
0.5000 mg | INTRAMUSCULAR | Status: DC | PRN
  Administered 2013-04-23 (×2): 1 mg via INTRAVENOUS
  Administered 2013-04-23: 0.5 mg via INTRAVENOUS
  Administered 2013-04-23: 1 mg via INTRAVENOUS
  Administered 2013-04-24 (×3): 0.5 mg via INTRAVENOUS
  Administered 2013-04-25 (×4): 1 mg via INTRAVENOUS
  Filled 2013-04-23 (×12): qty 1

## 2013-04-23 MED ORDER — CISATRACURIUM BESYLATE 20 MG/10ML IV SOLN
INTRAVENOUS | Status: AC
  Filled 2013-04-23: qty 10

## 2013-04-23 MED ORDER — CISATRACURIUM BESYLATE (PF) 10 MG/5ML IV SOLN
INTRAVENOUS | Status: DC | PRN
  Administered 2013-04-23: 4 ug via INTRAVENOUS
  Administered 2013-04-23: 2 ug via INTRAVENOUS
  Administered 2013-04-23: 6 ug via INTRAVENOUS

## 2013-04-23 MED ORDER — CEFAZOLIN SODIUM-DEXTROSE 2-3 GM-% IV SOLR
2.0000 g | INTRAVENOUS | Status: AC
  Administered 2013-04-23: 2 g via INTRAVENOUS

## 2013-04-23 MED ORDER — PROPOFOL 10 MG/ML IV BOLUS
INTRAVENOUS | Status: DC | PRN
  Administered 2013-04-23: 150 mg via INTRAVENOUS

## 2013-04-23 MED ORDER — CEFAZOLIN SODIUM-DEXTROSE 2-3 GM-% IV SOLR
2.0000 g | Freq: Three times a day (TID) | INTRAVENOUS | Status: AC
  Administered 2013-04-23 – 2013-04-24 (×3): 2 g via INTRAVENOUS
  Filled 2013-04-23 (×3): qty 50

## 2013-04-23 MED ORDER — GLYCOPYRROLATE 0.2 MG/ML IJ SOLN
INTRAMUSCULAR | Status: DC | PRN
  Administered 2013-04-23: 0.2 mg via INTRAVENOUS
  Administered 2013-04-23 (×2): 0.4 mg via INTRAVENOUS

## 2013-04-23 MED ORDER — OXYCODONE-ACETAMINOPHEN 5-325 MG PO TABS
1.0000 | ORAL_TABLET | ORAL | Status: DC | PRN
  Administered 2013-04-23 – 2013-04-24 (×2): 2 via ORAL
  Filled 2013-04-23 (×2): qty 2

## 2013-04-23 MED ORDER — GLIMEPIRIDE 4 MG PO TABS
4.0000 mg | ORAL_TABLET | Freq: Two times a day (BID) | ORAL | Status: DC
  Administered 2013-04-23 – 2013-04-25 (×5): 4 mg via ORAL
  Filled 2013-04-23 (×8): qty 1

## 2013-04-23 MED ORDER — ONDANSETRON HCL 4 MG/2ML IJ SOLN
INTRAMUSCULAR | Status: AC
  Filled 2013-04-23: qty 2

## 2013-04-23 MED ORDER — MIDAZOLAM HCL 2 MG/2ML IJ SOLN
INTRAMUSCULAR | Status: AC
  Filled 2013-04-23: qty 2

## 2013-04-23 SURGICAL SUPPLY — 53 items
ADH SKN CLS APL DERMABOND .7 (GAUZE/BANDAGES/DRESSINGS) ×1
APL SKNCLS STERI-STRIP NONHPOA (GAUZE/BANDAGES/DRESSINGS)
APPLIER CLIP 5 13 M/L LIGAMAX5 (MISCELLANEOUS)
APR CLP MED LRG 5 ANG JAW (MISCELLANEOUS)
BAG SPEC RTRVL LRG 6X4 10 (ENDOMECHANICALS)
BENZOIN TINCTURE PRP APPL 2/3 (GAUZE/BANDAGES/DRESSINGS) IMPLANT
BINDER ABDOMINAL 12 ML 46-62 (SOFTGOODS) ×3 IMPLANT
CANISTER SUCTION 2500CC (MISCELLANEOUS) ×3 IMPLANT
CHLORAPREP W/TINT 26ML (MISCELLANEOUS) ×3 IMPLANT
CLIP APPLIE 5 13 M/L LIGAMAX5 (MISCELLANEOUS) IMPLANT
CLOSURE WOUND 1/2 X4 (GAUZE/BANDAGES/DRESSINGS) ×2
DECANTER SPIKE VIAL GLASS SM (MISCELLANEOUS) ×1 IMPLANT
DERMABOND ADVANCED (GAUZE/BANDAGES/DRESSINGS) ×2
DERMABOND ADVANCED .7 DNX12 (GAUZE/BANDAGES/DRESSINGS) ×1 IMPLANT
DEVICE SECURE STRAP 25 ABSORB (INSTRUMENTS) ×5 IMPLANT
DEVICE TROCAR PUNCTURE CLOSURE (ENDOMECHANICALS) ×3 IMPLANT
DISSECTOR BLUNT TIP ENDO 5MM (MISCELLANEOUS) IMPLANT
DRAPE LAPAROSCOPIC ABDOMINAL (DRAPES) ×3 IMPLANT
DRAPE LG THREE QUARTER DISP (DRAPES) ×2 IMPLANT
DRAPE POUCH INSTRU U-SHP 10X18 (DRAPES) ×1 IMPLANT
ELECT REM PT RETURN 9FT ADLT (ELECTROSURGICAL) ×3
ELECTRODE REM PT RTRN 9FT ADLT (ELECTROSURGICAL) ×1 IMPLANT
GLOVE BIOGEL PI IND STRL 7.0 (GLOVE) ×1 IMPLANT
GLOVE BIOGEL PI INDICATOR 7.0 (GLOVE) ×2
GLOVE EUDERMIC 7 POWDERFREE (GLOVE) ×3 IMPLANT
GOWN STRL REUS W/TWL LRG LVL3 (GOWN DISPOSABLE) ×3 IMPLANT
GOWN STRL REUS W/TWL XL LVL3 (GOWN DISPOSABLE) ×6 IMPLANT
KIT BASIN OR (CUSTOM PROCEDURE TRAY) ×3 IMPLANT
MARKER SKIN DUAL TIP RULER LAB (MISCELLANEOUS) ×3 IMPLANT
MESH VENTRALIGHT ST 6IN CRC (Mesh General) ×3 IMPLANT
NDL SPNL 22GX3.5 QUINCKE BK (NEEDLE) ×4 IMPLANT
NEEDLE SPNL 22GX3.5 QUINCKE BK (NEEDLE) ×6 IMPLANT
NS IRRIG 1000ML POUR BTL (IV SOLUTION) ×3 IMPLANT
PAD ABD 8X10 STRL (GAUZE/BANDAGES/DRESSINGS) ×2 IMPLANT
POUCH SPECIMEN RETRIEVAL 10MM (ENDOMECHANICALS) IMPLANT
SCALPEL HARMONIC ACE (MISCELLANEOUS) ×3 IMPLANT
SCISSORS LAP 5X35 DISP (ENDOMECHANICALS) ×3 IMPLANT
SET IRRIG TUBING LAPAROSCOPIC (IRRIGATION / IRRIGATOR) ×1 IMPLANT
SOLUTION ANTI FOG 6CC (MISCELLANEOUS) ×3 IMPLANT
STAPLER VISISTAT 35W (STAPLE) ×2 IMPLANT
STRIP CLOSURE SKIN 1/2X4 (GAUZE/BANDAGES/DRESSINGS) ×4 IMPLANT
SUT MNCRL AB 4-0 PS2 18 (SUTURE) ×2 IMPLANT
SUT NOVA 0 T19/GS 22DT (SUTURE) ×1 IMPLANT
SUT NOVA NAB DX-16 0-1 5-0 T12 (SUTURE) ×1 IMPLANT
SUT NOVA NAB GS-21 0 18 T12 DT (SUTURE) ×4 IMPLANT
TOWEL OR 17X26 10 PK STRL BLUE (TOWEL DISPOSABLE) ×3 IMPLANT
TRAY FOLEY CATH 14FRSI W/METER (CATHETERS) ×3 IMPLANT
TRAY LAP CHOLE (CUSTOM PROCEDURE TRAY) ×3 IMPLANT
TROCAR BLADELESS OPT 5 75 (ENDOMECHANICALS) ×5 IMPLANT
TROCAR XCEL BLUNT TIP 100MML (ENDOMECHANICALS) ×3 IMPLANT
TROCAR XCEL NON-BLD 11X100MML (ENDOMECHANICALS) ×3 IMPLANT
TROCAR XCEL UNIV SLVE 11M 100M (ENDOMECHANICALS) ×6 IMPLANT
TUBING FILTER THERMOFLATOR (ELECTROSURGICAL) ×3 IMPLANT

## 2013-04-23 NOTE — Anesthesia Procedure Notes (Signed)
Procedure Name: Intubation Date/Time: 04/23/2013 7:33 AM Performed by: Frances Furbish Pre-anesthesia Checklist: Patient identified, Emergency Drugs available, Suction available, Patient being monitored and Timeout performed Patient Re-evaluated:Patient Re-evaluated prior to inductionOxygen Delivery Method: Circle system utilized Preoxygenation: Pre-oxygenation with 100% oxygen Intubation Type: IV induction Ventilation: Mask ventilation without difficulty Laryngoscope Size: Mac and 3 Grade View: Grade II Tube type: Oral Tube size: 8.0 mm Number of attempts: 1 Placement Confirmation: ETT inserted through vocal cords under direct vision,  positive ETCO2 and breath sounds checked- equal and bilateral Secured at: 22 cm Tube secured with: Tape Dental Injury: Teeth and Oropharynx as per pre-operative assessment

## 2013-04-23 NOTE — Op Note (Signed)
Patient Name:           Mario Burns   Date of Surgery:        04/23/2013  Note: This dictation was prepared with Dragon/digital dictation along with [facility] technology. Any transcriptional errors that result from this process are unintentional.  Pre op Diagnosis:      Ventral incisional hernia  Post op Diagnosis:    Incarcerated ventral incisional hernia  Procedure:                 Laparoscopic repair of incarcerated ventral incisional hernia with 15 cm x 15 cm ventraLite ST mesh  Surgeon:                     [name]. [name], M.D., FACS  Assistant:                      RNFA  Operative Indications:   Mario Burns is a 68 y.o. male. He wasreferred by Dr. Daiva Eves in [facility] for evaluation of an incisional hernia.  The patient underwent robotic prostatectomy by Dr. Risa Grill in December of 2013. That went well. About 4 months later he noticed a bulge to the left of his umbilicus there were the incisions. This is gradually enlarging and clearly is bigger when he eats. Not much pain. No alteration in bowel habits. No imaging studies. Exam reveals a partially reducible hernia to the left of the umbilicus centered around a vertical scar, presumably a laparoscopic extraction site. He states he was evaluated for what sounds like diastases recti 15 years ago. .  Comorbidities include robotic prostatectomy for prostate cancer by Dr. Risa Grill in December 2013, HTN, NIDDM, GERD.   Otherwise doing well. No prior history of hernia.    Operative Findings:       There was a 5 cm diameter hernia to the left of the umbilicus. It was about 5 cm vertically by about 4 cm transversely. There was incarcerated omentum in the hernia which was reduced. There was one small omental adhesion in the left lower quadrant which was taken down without incident. The small bowel was not involved.  Procedure in Detail:          Following the induction of general endotracheal anesthesia an attempt was made to  insert a Foley catheter. There was some resistance at the prostate and so we removed the catheter, no blood. We elected to leave the catheter out. This was discussed with Dr. Dutch Gray.    The abdomen and genitalia were prepped and draped in a sterile fashion. Intravenous antibiotics were given. Surgical time out was performed. 0.5% Marcaine with epinephrine was used as a local infiltration anesthetic. A 5 mm optical trocar was placed in the left subcostal region, midclavicular line. Optical entry was uneventful. Pneumoperitoneum was created. We looked around and saw no evidence of bleeding or injury. The bladder appeared empty. An 11 mm trocar was placed in the left lateral abdomen and a 5 mm trocar in the left lower quadrant. Ultimately we exchanged the 5 mm trocars  to the right near the end of the case. We took down the adhesions left lower quadrant. We took down the adhesions and the incarcerated omentum from the hernia sac. Using a spinal needle I measured the hernia and then elected to use a 15 cm x 15 cm piece of ventraLite ST mesh which would give [place] a 5 cm overlap in all directions.  The mesh was brought to the operative field. I placed 6 fixation sutures of #1 Novofil in the mesh, being careful to place the sutures so that the rough side would be up toward the abdominal wall and the smooth side would be down toward the viscera.  Reduced the pneumoperitoneum down to about 3 mm of mercury and marked a template on the abdominal wall for suture fixation sites. I then reinsufflated. I moistened the mesh, rolled it up and inserted it. I positioned the mesh carefully to follow the marked template, being careful to keep the rough side up toward the abdominal wall. At each of the 6 suture fixation sites I  made  a small stab wound with a #11 blade. Using an Endo Close device I passed the #1 Novafil sutures up through the abdominal wall, being careful to take about 1 cm bites of tissue. After all these sutures  were placed I lifted up the mesh and it appeared to be positioned well and without any redundancy. I let the pneumoperitoneum down to about 5 mm of mercury. Then tied all 6 suture fixations. This appeared to secure the mesh well without any redundancy and without any laxity. I then used the Secure Strap device and fired this all around the perimeter of the mesh and then centrally in a double crown technique. Great care was taken to fire the device so there was no more than 1 cm apart so that there was no defect in the mesh. This was examined several times both from the right and from the left. Positioning was good and there was no bleeding. We looked around at the small bowel and colon and omentum and saw no evidence of injury. The pneumoperitoneum was released and the trocars were removed. The puncture wounds were closed with subcuticular sutures of 4-0 Monocryl and Dermabond. A binder was placed. The patient tolerated the procedure well was taken to PACU in stable condition. EBL 10-20 cc. Counts correct. Complications none.     [name]. [name], M.D., FACS General and Minimally Invasive Surgery Breast and Colorectal Surgery  04/23/2013 9:27 AM

## 2013-04-23 NOTE — Interval H&P Note (Signed)
History and Physical Interval Note:  04/23/2013 7:12 AM  Mario Burns  has presented today for surgery, with the diagnosis of incisional hernia   The goals and the various methods of treatment have been discussed with the patient and family. After consideration of risks, benefits and other options for treatment, the patient has consented to  Procedure(s): LAPAROSCOPIC INCISIONAL HERNIA (N/A) INSERTION OF MESH (N/A) as a surgical intervention .  The patient's history has been reviewed, patient examined today, no change in status, stable for surgery.  I have reviewed the patient's chart and labs.  Questions were answered to the patient's satisfaction.     [name]

## 2013-04-23 NOTE — OR Nursing (Signed)
Six puncture sites around abdomen

## 2013-04-23 NOTE — Anesthesia Postprocedure Evaluation (Signed)
  Anesthesia Post-op Note  Patient: Mario Burns  Procedure(s) Performed: Procedure(s) (LRB): LAPAROSCOPIC INCISIONAL HERNIA (N/A) INSERTION OF MESH (N/A)  Patient Location: PACU  Anesthesia Type: General  Level of Consciousness: awake and alert   Airway and Oxygen Therapy: Patient Spontanous Breathing  Post-op Pain: mild  Post-op Assessment: Post-op Vital signs reviewed, Patient's Cardiovascular Status Stable, Respiratory Function Stable, Patent Airway and No signs of Nausea or vomiting  Last Vitals:  Filed Vitals:   04/23/13 0945  BP:   Pulse:   Temp: 36.8 C  Resp:     Post-op Vital Signs: stable   Complications: No apparent anesthesia complications

## 2013-04-23 NOTE — Progress Notes (Signed)
Assisted Urology MD at bedside with attempted foley catheter placement d/t urinary retention. MD unable to advance foley catheter to the "Y" d/t bladder neck stricture. Did get urine returned with attempt and so pt's bladder was emptied at this time. About 375 ml of amber, clear urine returned. Foley catheter tube removed at this time d/t inability to advance to the proper placement point. MD instructed RN to continue to monitor pt's output and post void residuals. Pt and his wife educated on this process and that we may need to place a foley (if able) or I&O cath him again if he has not voided within six hours. Will continue to monitor pt's output.   Kaylyn Layer

## 2013-04-23 NOTE — Transfer of Care (Signed)
Immediate Anesthesia Transfer of Care Note  Patient: Mario Burns  Procedure(s) Performed: Procedure(s): LAPAROSCOPIC INCISIONAL HERNIA (N/A) INSERTION OF MESH (N/A)  Patient Location: PACU  Anesthesia Type:General  Level of Consciousness: awake, alert  and oriented  Airway & Oxygen Therapy: Patient Spontanous Breathing and Patient connected to face mask oxygen  Post-op Assessment: Report given to PACU RN  Post vital signs: Reviewed and stable  Complications: No apparent anesthesia complications

## 2013-04-23 NOTE — Progress Notes (Signed)
Pt got OOB to the bathroom, but unable to urinate. Pt has pad on for leaking and it is dry. Pt ambulated about 45ft at this time in hallway. Once pt retuned to bed, bladder scan performed and pt found to have about 355ml of urine in bladder. Surgery MD paged regarding no urine output since surgery. MD called back and stated that he was going to contact pt's urologist, as he has a hx of a Robotic Prostatectomy, to come and place a foley catheter. Will continue to monitor pt for output.

## 2013-04-23 NOTE — Consult Note (Signed)
Day of Surgery  Subjective: I was asked to see [name] by Dr. [name] for post op urinary retention.   He had an incisional hernia repair today and an attempt at foley placement was unsuccessful.   His bladder was empty at laparoscopy so a further attempt was not made.  [name] is a patient of Dr. Risa Grill with a history of a robotic prostatectomy about 2 years ago and balloon dilation of a bladder neck contracture in 7/14.   He was voiding well but with incontinence prior to his procedure today.   He currently has pain in the suprapubic area and had 353ml on a postop bladder scan.  ROS:  Review of Systems  Constitutional: Negative for fever.  Respiratory: Negative for shortness of breath.   Cardiovascular: Negative for chest pain.  Gastrointestinal: Positive for abdominal pain.  Genitourinary:       He has stress incontinence but has not been leaking since surgery.     Allergies  Allergen Reactions  . Sulfa Antibiotics Other (See Comments)    unknown    Past Medical History  Diagnosis Date  . Hypertension   . Hypothyroidism   . Shortness of breath     ALWAYS--HAD FOR YEARS - STATES HE CAN'T DO HARDLY ANYTHING WITHOUT GIVING OUT  . GERD (gastroesophageal reflux disease)   . H/O hiatal hernia   . Arthritis   . OSA (obstructive sleep apnea)     NON-COMPLIANT CPAP--  CLAUSTROPHOBIC  . Type 2 diabetes mellitus   . Frequency of urination   . Urgency of urination   . Nocturia   . Neuromuscular disorder     PT STATES MYASTHENIA GRAVIS DX FEB 2013 BY DR. Joesph July STATES HE WAS HAVING PROBLEMS CHEWING CERTAIN FOODS AND UNABLE TO OPEN HIS MOUTH WIDE. - PT TOOK MEDICATION PRESCRIBED BY DR. Jannifer Franklin FOR A SHORT TIME- BUT CHEWING IMPROVED AND HE STOPPED MEDICATION BECAUSE OF THE SIDE EFFECTS.  PT STATE HE HAS NOTICED THAT HIS ISSUES HAVE RESOLVED.  Marland Kitchen Borderline hypercholesterolemia   . Cancer 2013    prostate  . Claustrophobia     Past Surgical History  Procedure Laterality Date   . Hemorrhoid surgery  1990's  . Robot assisted laparoscopic radical prostatectomy  12/15/2011    Procedure: [place];  Surgeon: Bernestine Amass, MD;  Location: [facility];  Service: Urology;  Laterality: N/A;  . Total thyroidectomy  05-24-2003  . Cystoscopy with urethral dilatation N/A 08/02/2012    Procedure: CYSTOSCOPY WITH URETHRAL DILATATION, AN REMOVAL STAPLE FROM BLADDER NECK;  Surgeon: Bernestine Amass, MD;  Location: [facility];  Service: Urology;  Laterality: N/A;    History   Social History  . Marital Status: Married    Spouse Name: N/A    Number of Children: N/A  . Years of Education: N/A   Occupational History  . Not on file.   Social History Main Topics  . Smoking status: Never Smoker   . Smokeless tobacco: Never Used  . Alcohol Use: No  . Drug Use: No  . Sexual Activity: Not on file   Other Topics Concern  . Not on file   Social History Narrative  . No narrative on file    History reviewed. No pertinent family history.  Anti-infectives: Anti-infectives   Start     Dose/Rate Route Frequency Ordered Stop   04/23/13 1400  ceFAZolin (ANCEF) IVPB 2 g/50 mL premix     2 g 100 mL/hr  over 30 Minutes Intravenous 3 times per day 04/23/13 1151 04/24/13 1359   04/23/13 0604  ceFAZolin (ANCEF) IVPB 2 g/50 mL premix     2 g 100 mL/hr over 30 Minutes Intravenous On call to O.R. 04/23/13 0604 04/23/13 0730      Current Facility-Administered Medications  Medication Dose Route Frequency Provider Last Rate Last Dose  . 0.9 % NaCl with KCl 20 mEq/ L  infusion   Intravenous Continuous [name], MD 75 mL/hr at 04/23/13 1015    . bisacodyl (DULCOLAX) EC tablet 5 mg  5 mg Oral Daily PRN [name], MD      . ceFAZolin (ANCEF) IVPB 2 g/50 mL premix  2 g Intravenous 3 times per day [name], MD   2 g at 04/23/13 1254  . docusate sodium (COLACE) capsule 100 mg  100 mg Oral Daily [name], MD    100 mg at 04/23/13 1255  . [START ON 04/24/2013] enoxaparin (LOVENOX) injection 40 mg  40 mg Subcutaneous Q24H [name], MD      . glimepiride (AMARYL) tablet 4 mg  4 mg Oral BID WC [name], MD   4 mg at 04/23/13 1713  . HYDROmorphone (DILAUDID) injection 0.5-1 mg  0.5-1 mg Intravenous Q1H PRN [name], MD   1 mg at 04/23/13 1542  . insulin aspart (novoLOG) injection 0-15 Units  0-15 Units Subcutaneous TID WC [name], MD   5 Units at 04/23/13 1713  . irbesartan (AVAPRO) tablet 75 mg  75 mg Oral Daily [name], MD   75 mg at 04/23/13 1255  . [START ON 04/24/2013] levothyroxine (SYNTHROID, LEVOTHROID) tablet 125 mcg  125 mcg Oral QAC breakfast [name], MD      . ondansetron [facility]) tablet 4 mg  4 mg Oral Q6H PRN [name], MD       Or  . ondansetron [facility]) injection 4 mg  4 mg Intravenous Q6H PRN [name], MD      . oxyCODONE-acetaminophen (PERCOCET/ROXICET) 5-325 MG per tablet 1-2 tablet  1-2 tablet Oral Q4H PRN [name], MD      . pantoprazole (PROTONIX) EC tablet 40 mg  40 mg Oral Daily [name], MD         Objective: Vital signs in last 24 hours: Temp:  [97.6 F (36.4 C)-99.3 F (37.4 C)] 97.7 F (36.5 C) (04/20 1400) Pulse Rate:  [57-104] 68 (04/20 1400) Resp:  [14-18] 16 (04/20 1400) BP: (97-118)/(59-70) 97/59 mmHg (04/20 1400) SpO2:  [96 %-100 %] 97 % (04/20 1400) Weight:  [84.823 kg (187 lb)] 84.823 kg (187 lb) (04/20 1200)  Intake/Output from previous day:   Intake/Output this shift: Total I/O In: 2080 [I.V.:2080] Out: 375 [Urine:375]   Physical Exam  Constitutional: He is well-developed, well-nourished, and in no distress.  In discomfort but no distress.   Abdominal: There is tenderness (in the SP area).  Genitourinary: Penis normal.    Lab Results:   Recent Labs  04/23/13 1230  WBC 14.9*  HGB 12.9*  HCT 37.3*  PLT 217   BMET  Recent Labs  04/23/13 1230  CREATININE 0.79    PT/INR No results found for this basename: LABPROT, INR,  in the last 72 hours ABG No results found for this basename: PHART, PCO2, PO2, HCO3,  in the last 72 hours  Studies/Results: No results found.  I discussed his case with Dr. [name]  and reviewed his prior hospital records.   Procedure:  An attempt was made to pass a 59fr foley after a betadine prep and urethral lubrication.  I was able to get the tip of the foley into the bladder but could not get the balloon past the bladder neck.  He drained about 500cc of clear urine and the foley was removed.   Assessment: s/p Procedure(s): LAPAROSCOPIC INCISIONAL HERNIA INSERTION OF MESH  He has a recurrent bladder neck contracture with postoperative urinary retention.  Plan: I emptied him with CIC and will see if he will be able to void on his own.   If he isn't, he will need a repeat dilation or incision of the [place].   I will start tamsulosin but don't know how much that is likely to help with his history.   CC: Dr. Fanny Skates and Dr. Rana Snare.     LOS: 0 days    Irine Seal 04/23/2013

## 2013-04-23 NOTE — OR Nursing (Signed)
Attempted foley insertion at 0745. Unable to pass.  Aborted per Dr. [name].

## 2013-04-23 NOTE — Anesthesia Preprocedure Evaluation (Signed)
Anesthesia Evaluation  Patient identified by MRN, date of birth, ID band Patient awake    Reviewed: Allergy & Precautions, H&P , NPO status , Patient's Chart, lab work & pertinent test results  Airway Mallampati: II TM Distance: >3 FB Neck ROM: Full    Dental no notable dental hx.    Pulmonary shortness of breath, sleep apnea ,  breath sounds clear to auscultation  Pulmonary exam normal       Cardiovascular hypertension, Pt. on medications negative cardio ROS  Rhythm:Regular Rate:Normal     Neuro/Psych Anxiety  Neuromuscular disease    GI/Hepatic Neg liver ROS, hiatal hernia, GERD-  Medicated,  Endo/Other  diabetes, Type 2, Oral Hypoglycemic AgentsHypothyroidism   Renal/GU negative Renal ROS  negative genitourinary   Musculoskeletal negative musculoskeletal ROS (+)   Abdominal   Peds negative pediatric ROS (+)  Hematology negative hematology ROS (+)   Anesthesia Other Findings   Reproductive/Obstetrics negative OB ROS                           Anesthesia Physical Anesthesia Plan  ASA: II  Anesthesia Plan: General   Post-op Pain Management:    Induction: Intravenous  Airway Management Planned: Oral ETT  Additional Equipment:   Intra-op Plan:   Post-operative Plan: Extubation in OR  Informed Consent: I have reviewed the patients History and Physical, chart, labs and discussed the procedure including the risks, benefits and alternatives for the proposed anesthesia with the patient or authorized representative who has indicated his/her understanding and acceptance.   Dental advisory given  Plan Discussed with: CRNA  Anesthesia Plan Comments:         Anesthesia Quick Evaluation

## 2013-04-24 ENCOUNTER — Encounter (HOSPITAL_COMMUNITY): Payer: Self-pay | Admitting: General Surgery

## 2013-04-24 LAB — BASIC METABOLIC PANEL
BUN: 21 mg/dL (ref 6–23)
CO2: 24 meq/L (ref 19–32)
CREATININE: 0.71 mg/dL (ref 0.50–1.35)
Calcium: 8.4 mg/dL (ref 8.4–10.5)
Chloride: 99 mEq/L (ref 96–112)
GFR calc Af Amer: >90 mL/min (ref >90)
GFR calc non Af Amer: >90 mL/min (ref >90)
GLUCOSE: 201 mg/dL — AB (ref 70–99)
Potassium: 4.1 mEq/L (ref 3.7–5.3)
SODIUM: 135 meq/L — AB (ref 137–147)

## 2013-04-24 LAB — GLUCOSE, CAPILLARY
GLUCOSE-CAPILLARY: 165 mg/dL — AB (ref 70–99)
GLUCOSE-CAPILLARY: 237 mg/dL — AB (ref 70–99)
Glucose-Capillary: 206 mg/dL — ABNORMAL HIGH (ref 70–99)
Glucose-Capillary: 141 mg/dL — ABNORMAL HIGH (ref 70–99)

## 2013-04-24 LAB — CBC
HCT: 37.2 % — ABNORMAL LOW (ref 39.0–52.0)
Hemoglobin: 12.4 g/dL — ABNORMAL LOW (ref 13.0–17.0)
MCH: 30 pg (ref 26.0–34.0)
MCHC: 33.3 g/dL (ref 30.0–36.0)
MCV: 89.9 fL (ref 78.0–100.0)
PLATELETS: 229 10*3/uL (ref 150–400)
RBC: 4.14 MIL/uL — AB (ref 4.22–5.81)
RDW: 13.2 % (ref 11.5–15.5)
WBC: 11.6 10*3/uL — ABNORMAL HIGH (ref 4.0–10.5)

## 2013-04-24 MED ORDER — ALUM & MAG HYDROXIDE-SIMETH 200-200-20 MG/5ML PO SUSP
30.0000 mL | ORAL | Status: DC | PRN
  Administered 2013-04-24 – 2013-04-25 (×2): 30 mL via ORAL
  Filled 2013-04-24 (×2): qty 30

## 2013-04-24 MED ORDER — OXYCODONE HCL 5 MG PO TABS
5.0000 mg | ORAL_TABLET | ORAL | Status: DC | PRN
  Administered 2013-04-24 – 2013-04-26 (×6): 10 mg via ORAL
  Filled 2013-04-24: qty 2
  Filled 2013-04-24: qty 1
  Filled 2013-04-24: qty 2
  Filled 2013-04-24: qty 1
  Filled 2013-04-24 (×4): qty 2

## 2013-04-24 MED ORDER — PANTOPRAZOLE SODIUM 40 MG PO TBEC
40.0000 mg | DELAYED_RELEASE_TABLET | Freq: Two times a day (BID) | ORAL | Status: DC
  Administered 2013-04-24 – 2013-04-25 (×3): 40 mg via ORAL
  Filled 2013-04-24 (×5): qty 1

## 2013-04-24 NOTE — Progress Notes (Signed)
Inpatient Diabetes Program Recommendations  AACE/ADA: New Consensus Statement on Inpatient Glycemic Control (2013)  Target Ranges:  Prepandial:   less than 140 mg/dL      Peak postprandial:   less than 180 mg/dL (1-2 hours)      Critically ill patients:  140 - 180 mg/dL   Reason for Visit: Hyperglycemia  Diabetes history: DM2 Outpatient Diabetes medications: Amaryl 4 mg bid Current orders for Inpatient glycemic control: Amaryl 4 mg bid and Novolog sensitive tidwc  Results for Mario Burns, Mario Burns (MRN [id number]) as of 04/24/2013 14:01  Ref. Range 04/23/2013 11:58 04/23/2013 16:37 04/23/2013 22:00 04/24/2013 07:20 04/24/2013 11:41  Glucose-Capillary Latest Range: 70-99 mg/dL 206 (H) 212 (H) 138 (H) 206 (H) 165 (H)     Inpatient Diabetes Program Recommendations Insulin - Basal: Consider addition of Lantus 10 units QHS HgbA1C: Please order HgbA1C to assess glycemic control prior to hospitalization   Will continue to follow. Thank you. Lorenda Peck, RD, LDN, CDE Inpatient Diabetes Coordinator [phone]

## 2013-04-24 NOTE — Progress Notes (Signed)
Patient up to void, pad patient has on wet but not saturated, patient said this was more his norm. Unable  to void, asked nurse to ambulate in hall then to try a cup of coffee to see if this would help.  Discussed with patient we would need to bladder scan to see how much urine was in his bladder.  Patient stated understanding, sitting on bed with coffee.  Will continue to monitor patient

## 2013-04-24 NOTE — Progress Notes (Addendum)
1 Day Post-Op  Subjective: Patient is stable, alert, pleasant. Has a little bit of anxiety because of voiding problems. Also a little bit of anxiety because he is having a little bit of difficulty swallowing, which she has had in the past with his myasthenia. No nausea or choking. No respiratory difficulties. Ambulating to  the bathroom.  I appreciate Dr. Ralene Muskrat attention to his urinary retention. It looks like he is starting to void now, and his last bladder scan showed only 31 cc.  Potassium 4.1. Creatinine 0.71. Glucose 201. WBC 11,600. Hemoglobin 12.4.  Objective: Vital signs in last 24 hours: Temp:  [97.6 F (36.4 C)-98.2 F (36.8 C)] 98.1 F (36.7 C) (04/21 0237) Pulse Rate:  [51-80] 80 (04/21 0410) Resp:  [14-18] 18 (04/21 0410) BP: (97-118)/(59-87) 106/87 mmHg (04/21 0237) SpO2:  [96 %-100 %] 97 % (04/21 0410) Weight:  [187 lb (84.823 kg)] 187 lb (84.823 kg) (04/20 1200) Last BM Date: 04/23/13 (small BM this am)  Intake/Output from previous day: 04/20 0701 - 04/21 0700 In: 3785 [P.O.:480; I.V.:3205; IV Piggyback:100] Out: 625 [Urine:625] Intake/Output this shift: Total I/O In: 1128.8 [P.O.:480; I.V.:548.8; IV Piggyback:100] Out: 250 [Urine:250]    EXAM: General appearance: alert. Pleasant. Mental status normal. Mild anxiety. No significant distress. Wife in room. Resp: clear to auscultation bilaterally GI: abdomen - Bowel sounds present but hypoactive. Wounds all looked good. Appropriate tenderness. Reasonably soft.  Lab Results:  Results for orders placed during the hospital encounter of 04/23/13 (from the past 24 hour(s))  GLUCOSE, CAPILLARY     Status: Abnormal   Collection Time    04/23/13  6:28 AM      Result Value Ref Range   Glucose-Capillary 186 (*) 70 - 99 mg/dL   Comment 1 Documented in Chart    GLUCOSE, CAPILLARY     Status: Abnormal   Collection Time    04/23/13 10:10 AM      Result Value Ref Range   Glucose-Capillary 192 (*) 70 - 99 mg/dL   GLUCOSE, CAPILLARY     Status: Abnormal   Collection Time    04/23/13 11:58 AM      Result Value Ref Range   Glucose-Capillary 206 (*) 70 - 99 mg/dL  CBC     Status: Abnormal   Collection Time    04/23/13 12:30 PM      Result Value Ref Range   WBC 14.9 (*) 4.0 - 10.5 K/uL   RBC 4.21 (*) 4.22 - 5.81 MIL/uL   Hemoglobin 12.9 (*) 13.0 - 17.0 g/dL   HCT 37.3 (*) 39.0 - 52.0 %   MCV 88.6  78.0 - 100.0 fL   MCH 30.6  26.0 - 34.0 pg   MCHC 34.6  30.0 - 36.0 g/dL   RDW 13.0  11.5 - 15.5 %   Platelets 217  150 - 400 K/uL  CREATININE, SERUM     Status: None   Collection Time    04/23/13 12:30 PM      Result Value Ref Range   Creatinine, Ser 0.79  0.50 - 1.35 mg/dL   GFR calc non Af Amer >90  >90 mL/min   GFR calc Af Amer >90  >90 mL/min  GLUCOSE, CAPILLARY     Status: Abnormal   Collection Time    04/23/13  4:37 PM      Result Value Ref Range   Glucose-Capillary 212 (*) 70 - 99 mg/dL  GLUCOSE, CAPILLARY     Status: Abnormal   Collection Time  04/23/13 10:00 PM      Result Value Ref Range   Glucose-Capillary 138 (*) 70 - 99 mg/dL  CBC     Status: Abnormal   Collection Time    04/24/13  4:50 AM      Result Value Ref Range   WBC 11.6 (*) 4.0 - 10.5 K/uL   RBC 4.14 (*) 4.22 - 5.81 MIL/uL   Hemoglobin 12.4 (*) 13.0 - 17.0 g/dL   HCT 37.2 (*) 39.0 - 52.0 %   MCV 89.9  78.0 - 100.0 fL   MCH 30.0  26.0 - 34.0 pg   MCHC 33.3  30.0 - 36.0 g/dL   RDW 13.2  11.5 - 15.5 %   Platelets 229  150 - 400 K/uL  BASIC METABOLIC PANEL     Status: Abnormal   Collection Time    04/24/13  4:50 AM      Result Value Ref Range   Sodium 135 (*) 137 - 147 mEq/L   Potassium 4.1  3.7 - 5.3 mEq/L   Chloride 99  96 - 112 mEq/L   CO2 24  19 - 32 mEq/L   Glucose, Bld 201 (*) 70 - 99 mg/dL   BUN 21  6 - 23 mg/dL   Creatinine, Ser 0.71  0.50 - 1.35 mg/dL   Calcium 8.4  8.4 - 10.5 mg/dL   GFR calc non Af Amer >90  >90 mL/min   GFR calc Af Amer >90  >90 mL/min     Studies/Results: No results  found.  Marland Kitchen alfuzosin  10 mg Oral Q breakfast  .  ceFAZolin (ANCEF) IV  2 g Intravenous 3 times per day  . docusate sodium  100 mg Oral Daily  . enoxaparin (LOVENOX) injection  40 mg Subcutaneous Q24H  . glimepiride  4 mg Oral BID WC  . insulin aspart  0-15 Units Subcutaneous TID WC  . irbesartan  75 mg Oral Daily  . levothyroxine  125 mcg Oral QAC breakfast  . pantoprazole  40 mg Oral Daily     Assessment/Plan: s/p Procedure(s): LAPAROSCOPIC INCISIONAL HERNIA INSERTION OF MESH  POD#1.  Laparoscopic repair of incarcerated incisional hernia with mesh. Stable surgically. Advance diet Ambulate more Possible discharge home tomorrow  Urinary retention. History robotic prostatectomy 2013. Possible bladder neck stricture. Voiding improved. Appreciate urology consultation and advice Observe  Past history of myasthenia gravis. Currently on no medication.  Non-insulin-dependent diabetes mellitus. continue Amaryl and SSI.  GERD.  Continue PPI.  @PROBHOSP @  LOS: 1 day    [name] 04/24/2013  . .prob

## 2013-04-24 NOTE — Progress Notes (Signed)
Discussed with Dr Jeffie Pollock patient unable to pass urine.  Bladder scanned for 210cc.  Patient reports fullness and mild  discomfort, patient abdomen tender unable to place to much pressure for scan.  Dr Jeffie Pollock will be in to see patient

## 2013-04-24 NOTE — Progress Notes (Signed)
Patient ID: Mario Burns, male   DOB: 11-19-1945, 68 y.o.   MRN: [id number]  I was called at 2 am and notified that [name] had not been able to void since I drained his bladder yesterday evening.  He had about 255ml in the bladder on a bladder scan and was not that uncomfortable.   I arrived at 3:30 am with the intent to place a foley but he was given an additional opportunity to void and was able to void 100 ml with a slow stream.   He has no pain or fever.   VS reviewed He is in no acute distress.  Imp: bladder neck contraction with post op retention now voiding better on alfuzosin which was started instead of tamsulosin because of a history of sulfa allergy.  Rec:  His PVR will be monitored and I will have Dr. Risa Grill follow him.   He may need a repeat dilation or incision of the bladder neck contracture if he doesn't improve.

## 2013-04-24 NOTE — Progress Notes (Signed)
Came to visit patient to offer [facility] services for DM management at home. He states he does not think he needs [facility] Care Management at this time. However, he was appreciative of visit and states he will call if needed in the future. Left [facility] Care Management brochure at bedside.  Marthenia Rolling, MSN- RN,BSN- [facility] [phone]

## 2013-04-24 NOTE — Progress Notes (Addendum)
UR completed.  04/25/13- UR completed. Patient changed to inpatient- requiring frequent IV pain medication.

## 2013-04-24 NOTE — Progress Notes (Signed)
1 Day Post-Op Subjective: Patient reports voiding x2 since last assessment. Prior to surgery he reports a relatively strong urinary stream and was satisfied with his overall voiding patterns. He recently voided 150 mL with a fairly weak stream but had a minimal postvoid residual on bladder scan. I reviewed Dr. Ralene Muskrat notes and discuss the situation with him.  Objective: Vital signs in last 24 hours: Temp:  [97.6 F (36.4 C)-98.2 F (36.8 C)] 98.2 F (36.8 C) (04/21 0604) Pulse Rate:  [51-80] 61 (04/21 0604) Resp:  [14-18] 18 (04/21 0604) BP: (97-118)/(59-87) 97/63 mmHg (04/21 0604) SpO2:  [96 %-100 %] 97 % (04/21 0604) Weight:  [187 lb (84.823 kg)] 187 lb (84.823 kg) (04/20 1200)  Intake/Output from previous day: 04/20 0701 - 04/21 0700 In: 4151.3 [P.O.:480; I.V.:3521.3; IV Piggyback:150] Out: 625 [Urine:625] Intake/Output this shift:    Physical Exam:  Constitutional: Vital signs reviewed. WD WN in NAD   Eyes: PERRL, No scleral icterus.   Cardiovascular: RRR Pulmonary/Chest: Normal effort Abdominal: Soft.  Extremities: No cyanosis or edema   Lab Results:  Recent Labs  04/23/13 1230 04/24/13 0450  HGB 12.9* 12.4*  HCT 37.3* 37.2*   BMET  Recent Labs  04/23/13 1230 04/24/13 0450  NA  --  135*  K  --  4.1  CL  --  99  CO2  --  24  GLUCOSE  --  201*  BUN  --  21  CREATININE 0.79 0.71  CALCIUM  --  8.4   No results found for this basename: LABPT, INR,  in the last 72 hours No results found for this basename: LABURIN,  in the last 72 hours Results for orders placed during the hospital encounter of 12/10/11  SURGICAL PCR SCREEN     Status: None   Collection Time    12/10/11  8:05 AM      Result Value Ref Range Status   MRSA, PCR NEGATIVE  NEGATIVE Final   Staphylococcus aureus NEGATIVE  NEGATIVE Final   Comment:            The Xpert SA Assay (FDA     approved for NASAL specimens     in patients over 35 years of age),     is one component of     a  comprehensive surveillance     program.  Test performance has     been validated by [employer] for patients greater     than or equal to 15 year old.     It is not intended     to diagnose infection nor to     guide or monitor treatment.    Studies/Results: No results found.  Assessment/Plan:   Mario Burns is going to be observed an additional 24 hours. We'll continue to monitor his voiding and post void residual. I discussed with nursing staff continued assessment of his post void residual. He may require a repeat balloon dilation of his anastomosis although he was voiding relatively well prior to his most recent surgery and hopefully things will return to baseline.   LOS: 1 day   Mario Burns 04/24/2013, 8:15 AM

## 2013-04-24 NOTE — Progress Notes (Addendum)
Dr Jeffie Pollock in to see patient, voided 100 cc or urine, walked in hallway, given po pain medication which patient had difficulty swallowing, patient helped back to room and asked to be seated at bedside.  Dr Jeffie Pollock indicated he would continue to monitor but since patient voided the 100 cc he doesn't feel patient needs a catheter  at this time.  Patient given fluids to drink. Will continue to monitor patient

## 2013-04-25 ENCOUNTER — Observation Stay (HOSPITAL_COMMUNITY): Payer: Medicare PPO

## 2013-04-25 LAB — CBC
HCT: 36.9 % — ABNORMAL LOW (ref 39.0–52.0)
Hemoglobin: 12 g/dL — ABNORMAL LOW (ref 13.0–17.0)
MCH: 29.6 pg (ref 26.0–34.0)
MCHC: 32.5 g/dL (ref 30.0–36.0)
MCV: 91.1 fL (ref 78.0–100.0)
PLATELETS: 214 10*3/uL (ref 150–400)
RBC: 4.05 MIL/uL — ABNORMAL LOW (ref 4.22–5.81)
RDW: 12.9 % (ref 11.5–15.5)
WBC: 8.4 10*3/uL (ref 4.0–10.5)

## 2013-04-25 LAB — GLUCOSE, CAPILLARY
GLUCOSE-CAPILLARY: 166 mg/dL — AB (ref 70–99)
GLUCOSE-CAPILLARY: 146 mg/dL — AB (ref 70–99)
Glucose-Capillary: 149 mg/dL — ABNORMAL HIGH (ref 70–99)
Glucose-Capillary: 132 mg/dL — ABNORMAL HIGH (ref 70–99)

## 2013-04-25 LAB — BASIC METABOLIC PANEL
BUN: 16 mg/dL (ref 6–23)
CALCIUM: 8.5 mg/dL (ref 8.4–10.5)
CO2: 27 mEq/L (ref 19–32)
Chloride: 101 mEq/L (ref 96–112)
Creatinine, Ser: 0.76 mg/dL (ref 0.50–1.35)
Glucose, Bld: 177 mg/dL — ABNORMAL HIGH (ref 70–99)
POTASSIUM: 4.5 meq/L (ref 3.7–5.3)
SODIUM: 134 meq/L — AB (ref 137–147)

## 2013-04-25 LAB — HEMOGLOBIN A1C
HEMOGLOBIN A1C: 9.2 % — AB (ref <5.7)
Mean Plasma Glucose: 217 mg/dL — ABNORMAL HIGH (ref <117)

## 2013-04-25 MED ORDER — IOHEXOL 300 MG/ML  SOLN
25.0000 mL | INTRAMUSCULAR | Status: AC
  Administered 2013-04-25 (×2): 25 mL via ORAL

## 2013-04-25 MED ORDER — IOHEXOL 300 MG/ML  SOLN
100.0000 mL | Freq: Once | INTRAMUSCULAR | Status: AC | PRN
  Administered 2013-04-25: 100 mL via INTRAVENOUS

## 2013-04-25 MED ORDER — POLYETHYLENE GLYCOL 3350 17 G PO PACK
17.0000 g | PACK | Freq: Once | ORAL | Status: AC
  Administered 2013-04-25: 17 g via ORAL
  Filled 2013-04-25: qty 1

## 2013-04-25 MED ORDER — BISACODYL 10 MG RE SUPP
10.0000 mg | Freq: Once | RECTAL | Status: AC
  Administered 2013-04-25: 10 mg via RECTAL
  Filled 2013-04-25: qty 1

## 2013-04-25 MED ORDER — INSULIN GLARGINE 100 UNIT/ML ~~LOC~~ SOLN
10.0000 [IU] | Freq: Every day | SUBCUTANEOUS | Status: DC
  Administered 2013-04-25: 10 [IU] via SUBCUTANEOUS
  Filled 2013-04-25: qty 0.1

## 2013-04-25 NOTE — Progress Notes (Signed)
2 Days Post-Op  Subjective: Awake and alert and stable. Complains of lower abdominal pain, not getting better, requiring Dilaudid. I was unaware of this. No nausea. Complains of heartburn, which is a chronic problem. No stool or flatus. Seems to be voiding better. BladderScan less than 200 cc.  Heart rate 69.    BP 113/65.    Temp.  98.4.  CBGs ranged from 141 to 237.  Add Lantus 10 units each bedtime.  The patient appreciate urologic followup.  Objective: Vital signs in last 24 hours: Temp:  [98.1 F (36.7 C)-98.5 F (36.9 C)] 98.4 F (36.9 C) (04/22 0538) Pulse Rate:  [69-77] 69 (04/22 0538) Resp:  [18] 18 (04/22 0538) BP: (109-126)/(65-76) 113/65 mmHg (04/22 0538) SpO2:  [97 %-99 %] 97 % (04/22 0538) Last BM Date: 04/23/13  Intake/Output from previous day: 04/21 0701 - 04/22 0700 In: 2029.2 [P.O.:1440; I.V.:589.2] Out: 1201 [Urine:1201] Intake/Output this shift:    General appearance: alert and cooperative. Mental status normal. Some anxiety. Mild distress. Skin warm and dry. Resp: clear to auscultation bilaterally GI: abdomen seems diffusely tender, perhaps more on left and suprapubic area. Borderline distended. Rare bowel sounds. Wounds looked fine. Very soft in the lateral flanks.  Lab Results:  Results for orders placed during the hospital encounter of 04/23/13 (from the past 24 hour(s))  GLUCOSE, CAPILLARY     Status: Abnormal   Collection Time    04/24/13  7:20 AM      Result Value Ref Range   Glucose-Capillary 206 (*) 70 - 99 mg/dL   Comment 1 Notify RN     Comment 2 Documented in Chart    GLUCOSE, CAPILLARY     Status: Abnormal   Collection Time    04/24/13 11:41 AM      Result Value Ref Range   Glucose-Capillary 165 (*) 70 - 99 mg/dL   Comment 1 Notify RN     Comment 2 Documented in Chart    GLUCOSE, CAPILLARY     Status: Abnormal   Collection Time    04/24/13  3:42 PM      Result Value Ref Range   Glucose-Capillary 237 (*) 70 - 99 mg/dL   Comment  1 Notify RN     Comment 2 Documented in Chart    GLUCOSE, CAPILLARY     Status: Abnormal   Collection Time    04/24/13  9:55 PM      Result Value Ref Range   Glucose-Capillary 141 (*) 70 - 99 mg/dL  CBC     Status: Abnormal   Collection Time    04/25/13  4:39 AM      Result Value Ref Range   WBC 8.4  4.0 - 10.5 K/uL   RBC 4.05 (*) 4.22 - 5.81 MIL/uL   Hemoglobin 12.0 (*) 13.0 - 17.0 g/dL   HCT 36.9 (*) 39.0 - 52.0 %   MCV 91.1  78.0 - 100.0 fL   MCH 29.6  26.0 - 34.0 pg   MCHC 32.5  30.0 - 36.0 g/dL   RDW 12.9  11.5 - 15.5 %   Platelets 214  150 - 400 K/uL  BASIC METABOLIC PANEL     Status: Abnormal   Collection Time    04/25/13  4:39 AM      Result Value Ref Range   Sodium 134 (*) 137 - 147 mEq/L   Potassium 4.5  3.7 - 5.3 mEq/L   Chloride 101  96 - 112 mEq/L   CO2  27  19 - 32 mEq/L   Glucose, Bld 177 (*) 70 - 99 mg/dL   BUN 16  6 - 23 mg/dL   Creatinine, Ser 0.76  0.50 - 1.35 mg/dL   Calcium 8.5  8.4 - 10.5 mg/dL   GFR calc non Af Amer >90  >90 mL/min   GFR calc Af Amer >90  >90 mL/min     Studies/Results: No results found.  Marland Kitchen alfuzosin  10 mg Oral Q breakfast  . bisacodyl  10 mg Rectal Once  . docusate sodium  100 mg Oral Daily  . enoxaparin (LOVENOX) injection  40 mg Subcutaneous Q24H  . glimepiride  4 mg Oral BID WC  . insulin aspart  0-15 Units Subcutaneous TID WC  . irbesartan  75 mg Oral Daily  . levothyroxine  125 mcg Oral QAC breakfast  . pantoprazole  40 mg Oral BID     Assessment/Plan: s/p Procedure(s): LAPAROSCOPIC INCISIONAL HERNIA INSERTION OF MESH  POD#2. Laparoscopic repair of incarcerated incisional hernia with mesh. Stable  Although he does not look toxic or ill, no fever or tachycardia, and no leukocytosis, I am concerned because of his degree of pain which seems out of proportion to the surgical event. Urinary retention could cause this type of suprapubic pain, but his bladder scan does not show significant retention Proceed with CT scan  abdomen and pelvis with contrast to make sure we haven't had a complication such as bleeding or GI perforation.  Urinary retention. History robotic prostatectomy 2013. Possible bladder neck stricture. Voiding improved.  Appreciate urology consultation and advice  Observe   Past history of myasthenia gravis. Currently on no medication.  Non-insulin-dependent diabetes mellitus. continue Amaryl and SSI.   GERD. Continue PPI.   @PROBHOSP @  LOS: 2 days    Mario Burns 04/25/2013  . .prob

## 2013-04-25 NOTE — Progress Notes (Signed)
2 Days Post-Op Subjective: Patient reports ongoing suprapubic and lower abdominal pain requiring Diluadid. A CT scan of the abdomen and pelvis has been ordered. From a voiding standpoint things are still not at baseline. He is having increased hesitancy and a weak stream but does feel like he is emptying okay. E. Voiding close to 300 cc at a post would residual of about 150 cc.  Objective: Vital signs in last 24 hours: Temp:  [98.1 F (36.7 C)-98.5 F (36.9 C)] 98.4 F (36.9 C) (04/22 0538) Pulse Rate:  [69-77] 69 (04/22 0538) Resp:  [18] 18 (04/22 0538) BP: (109-126)/(65-76) 113/65 mmHg (04/22 0538) SpO2:  [97 %-99 %] 97 % (04/22 0538)  Intake/Output from previous day: 04/21 0701 - 04/22 0700 In: 2029.2 [P.O.:1440; I.V.:589.2] Out: 1201 [Urine:1201] Intake/Output this shift:    Physical Exam:  Constitutional: Vital signs reviewed. WD WN in NAD   Eyes: PERRL, No scleral icterus.   Cardiovascular: RRR Pulmonary/Chest: Normal effort Abdominal:Moderate sp tenderness Extremities: No cyanosis or edema   Lab Results:  Recent Labs  04/23/13 1230 04/24/13 0450 04/25/13 0439  HGB 12.9* 12.4* 12.0*  HCT 37.3* 37.2* 36.9*   BMET  Recent Labs  04/24/13 0450 04/25/13 0439  NA 135* 134*  K 4.1 4.5  CL 99 101  CO2 24 27  GLUCOSE 201* 177*  BUN 21 16  CREATININE 0.71 0.76  CALCIUM 8.4 8.5   No results found for this basename: LABPT, INR,  in the last 72 hours No results found for this basename: LABURIN,  in the last 72 hours Results for orders placed during the hospital encounter of 12/10/11  SURGICAL PCR SCREEN     Status: None   Collection Time    12/10/11  8:05 AM      Result Value Ref Range Status   MRSA, PCR NEGATIVE  NEGATIVE Final   Staphylococcus aureus NEGATIVE  NEGATIVE Final   Comment:            The Xpert SA Assay (FDA     approved for NASAL specimens     in patients over 68 years of age),     is one component of     a comprehensive surveillance   program.  Test performance has     been validated by [employer] for patients greater     than or equal to 51 year old.     It is not intended     to diagnose infection nor to     guide or monitor treatment.    Studies/Results: Dg Abd Portable 1v  04/25/2013   CLINICAL DATA:  And lumbar abdominal pain and distension. Ventral hernia repair 2 days ago.  EXAM: PORTABLE ABDOMEN - 1 VIEW  COMPARISON:  Cystogram 01/04/2012  FINDINGS: The study is degraded by bandaging or support wrap over the abdomen. The bowel gas pattern is unremarkable. There is no evidence for obstruction or free air. Mild degenerative changes are present in the lower lumbar spine.  IMPRESSION: 1. Nonspecific bowel gas pattern without evidence for obstruction or free air. 2. Mild degenerative changes within the lower lumbar spine.   Electronically Signed   By: Lawrence Santiago M.D.   On: 04/25/2013 08:35    Assessment/Plan:   Urologically would continue to observe. I do not feel he needs a more urgent attention to his bladder neck contracture at this point we will check CT to be sure there is not gross evidence of a markedly  distended bladder or significant fluid within the pelvis.   LOS: 2 days   ELIZAH MIERZWA 04/25/2013, 9:22 AM

## 2013-04-26 ENCOUNTER — Encounter (HOSPITAL_COMMUNITY): Payer: Self-pay | Admitting: Emergency Medicine

## 2013-04-26 ENCOUNTER — Emergency Department (HOSPITAL_COMMUNITY)
Admission: EM | Admit: 2013-04-26 | Discharge: 2013-04-26 | Disposition: A | Discharge status: Home / Self Care | Payer: Medicare PPO | Attending: Emergency Medicine | Admitting: Emergency Medicine

## 2013-04-26 ENCOUNTER — Telehealth (INDEPENDENT_AMBULATORY_CARE_PROVIDER_SITE_OTHER): Payer: Self-pay

## 2013-04-26 DIAGNOSIS — I10 Essential (primary) hypertension: Secondary | ICD-10-CM | POA: Insufficient documentation

## 2013-04-26 DIAGNOSIS — E039 Hypothyroidism, unspecified: Secondary | ICD-10-CM | POA: Insufficient documentation

## 2013-04-26 DIAGNOSIS — K219 Gastro-esophageal reflux disease without esophagitis: Secondary | ICD-10-CM | POA: Insufficient documentation

## 2013-04-26 DIAGNOSIS — M129 Arthropathy, unspecified: Secondary | ICD-10-CM | POA: Insufficient documentation

## 2013-04-26 DIAGNOSIS — Z8546 Personal history of malignant neoplasm of prostate: Secondary | ICD-10-CM | POA: Insufficient documentation

## 2013-04-26 DIAGNOSIS — R141 Gas pain: Secondary | ICD-10-CM | POA: Insufficient documentation

## 2013-04-26 DIAGNOSIS — R143 Flatulence: Secondary | ICD-10-CM

## 2013-04-26 DIAGNOSIS — Z8659 Personal history of other mental and behavioral disorders: Secondary | ICD-10-CM | POA: Insufficient documentation

## 2013-04-26 DIAGNOSIS — R339 Retention of urine, unspecified: Secondary | ICD-10-CM | POA: Insufficient documentation

## 2013-04-26 DIAGNOSIS — R142 Eructation: Secondary | ICD-10-CM

## 2013-04-26 DIAGNOSIS — E119 Type 2 diabetes mellitus without complications: Secondary | ICD-10-CM | POA: Insufficient documentation

## 2013-04-26 DIAGNOSIS — Z8669 Personal history of other diseases of the nervous system and sense organs: Secondary | ICD-10-CM | POA: Insufficient documentation

## 2013-04-26 DIAGNOSIS — Z79899 Other long term (current) drug therapy: Secondary | ICD-10-CM | POA: Insufficient documentation

## 2013-04-26 DIAGNOSIS — R109 Unspecified abdominal pain: Secondary | ICD-10-CM | POA: Insufficient documentation

## 2013-04-26 LAB — BASIC METABOLIC PANEL
BUN: 13 mg/dL (ref 6–23)
CHLORIDE: 101 meq/L (ref 96–112)
CO2: 26 meq/L (ref 19–32)
Calcium: 8.6 mg/dL (ref 8.4–10.5)
Creatinine, Ser: 0.73 mg/dL (ref 0.50–1.35)
GFR calc Af Amer: >90 mL/min (ref >90)
GFR calc non Af Amer: >90 mL/min (ref >90)
Glucose, Bld: 140 mg/dL — ABNORMAL HIGH (ref 70–99)
Potassium: 4.1 mEq/L (ref 3.7–5.3)
SODIUM: 136 meq/L — AB (ref 137–147)

## 2013-04-26 LAB — CBC WITH DIFFERENTIAL/PLATELET
Basophils Absolute: 0 10*3/uL (ref 0.0–0.1)
Basophils Relative: 0 % (ref 0–1)
Eosinophils Absolute: 0.1 10*3/uL (ref 0.0–0.7)
Eosinophils Relative: 2 % (ref 0–5)
HEMATOCRIT: 37.6 % — AB (ref 39.0–52.0)
Hemoglobin: 12.7 g/dL — ABNORMAL LOW (ref 13.0–17.0)
LYMPHS PCT: 26 % (ref 12–46)
Lymphs Abs: 2.4 10*3/uL (ref 0.7–4.0)
MCH: 30.5 pg (ref 26.0–34.0)
MCHC: 33.8 g/dL (ref 30.0–36.0)
MCV: 90.4 fL (ref 78.0–100.0)
Monocytes Absolute: 0.8 10*3/uL (ref 0.1–1.0)
Monocytes Relative: 9 % (ref 3–12)
NEUTROS ABS: 5.8 10*3/uL (ref 1.7–7.7)
Neutrophils Relative %: 64 % (ref 43–77)
PLATELETS: 249 10*3/uL (ref 150–400)
RBC: 4.16 MIL/uL — AB (ref 4.22–5.81)
RDW: 12.8 % (ref 11.5–15.5)
WBC: 9.2 10*3/uL (ref 4.0–10.5)

## 2013-04-26 LAB — GLUCOSE, CAPILLARY: Glucose-Capillary: 153 mg/dL — ABNORMAL HIGH (ref 70–99)

## 2013-04-26 MED ORDER — LIDOCAINE HCL 2 % EX GEL
Freq: Once | CUTANEOUS | Status: AC
  Administered 2013-04-26: 10 via URETHRAL

## 2013-04-26 MED ORDER — LIDOCAINE HCL 2 % EX GEL
CUTANEOUS | Status: AC
  Filled 2013-04-26: qty 10

## 2013-04-26 MED ORDER — OXYCODONE-ACETAMINOPHEN 7.5-325 MG PO TABS: 1.0000 | ORAL_TABLET | ORAL | Status: DC | PRN

## 2013-04-26 NOTE — ED Notes (Addendum)
Foley catheter attempted by myself and Lanelle Bal, Therapist, sports. Foley catheter was not successful, as I met a great deal of resistance. RN Morey Hummingbird notified.

## 2013-04-26 NOTE — Progress Notes (Signed)
3 Days Post-Op  Subjective: Note: This dictation was prepared with Dragon/digital dictation along with Software engineer. Any transcriptional errors that result from this process are unintentional.  Plain abdominal x-rays are unremarkable. CT scan is reassuring. There is no sign of any hemorrhage. No sign of any free fluid. No evidence of inflammatory change or obstruction. Hernia repair appears intact. Typical amount of fluid in the hernia sac. No fluid in the pelvis. No radiographic evidence of surgical complication or acute inflammatory, obstructive, or ischemic process.  The abdominal pain is clearly improved but persistent. He is now using Percocet and has stopped using the duodenum.He has some anxiety about his abdominal pain. He is ambulating the halls, tolerating his diet. Limiting reasonably well, almost back to baseline according to him. Has had 2 bowel movements.  Hemoglobin 12.7. WBC 9200. Creatinine 0.73. BUN 13.  Objective: Vital signs in last 24 hours: Temp:  [98.2 F (36.8 C)-98.6 F (37 C)] 98.6 F (37 C) (04/22 2230) Pulse Rate:  [69-84] 84 (04/22 2230) Resp:  [18] 18 (04/22 2230) BP: (111-125)/(65-68) 125/66 mmHg (04/22 2230) SpO2:  [94 %-97 %] 94 % (04/22 2230) Last BM Date: 04/25/13  Intake/Output from previous day: 04/22 0701 - 04/23 0700 In: 1360 [P.O.:360; I.V.:1000] Out: 850 [Urine:850] Intake/Output this shift: Total I/O In: 400 [I.V.:400] Out: 250 [Urine:250]    EXAM General appearance: alert and cooperative. Friendly. Mental status normal. Mild anxiety. Resp: clear to auscultation bilaterally GI: soft. Diffusely tender, This appears to be appropriate incisional tenderness.Kermit Balo bowel sounds. Not distended. Wounds looked good.  Lab Results:  Results for orders placed during the hospital encounter of 04/23/13 (from the past 24 hour(s))  GLUCOSE, CAPILLARY     Status: Abnormal   Collection Time    04/25/13  7:20 AM      Result Value Ref Range    Glucose-Capillary 149 (*) 70 - 99 mg/dL   Comment 1 Notify RN     Comment 2 Documented in Chart    GLUCOSE, CAPILLARY     Status: Abnormal   Collection Time    04/25/13 11:23 AM      Result Value Ref Range   Glucose-Capillary 132 (*) 70 - 99 mg/dL   Comment 1 Notify RN     Comment 2 Documented in Chart    GLUCOSE, CAPILLARY     Status: Abnormal   Collection Time    04/25/13  3:29 PM      Result Value Ref Range   Glucose-Capillary 166 (*) 70 - 99 mg/dL   Comment 1 Notify RN     Comment 2 Documented in Chart    GLUCOSE, CAPILLARY     Status: Abnormal   Collection Time    04/25/13  9:36 PM      Result Value Ref Range   Glucose-Capillary 146 (*) 70 - 99 mg/dL  CBC WITH DIFFERENTIAL     Status: Abnormal   Collection Time    04/26/13  4:27 AM      Result Value Ref Range   WBC 9.2  4.0 - 10.5 K/uL   RBC 4.16 (*) 4.22 - 5.81 MIL/uL   Hemoglobin 12.7 (*) 13.0 - 17.0 g/dL   HCT 37.6 (*) 39.0 - 52.0 %   MCV 90.4  78.0 - 100.0 fL   MCH 30.5  26.0 - 34.0 pg   MCHC 33.8  30.0 - 36.0 g/dL   RDW 12.8  11.5 - 15.5 %   Platelets 249  150 - 400  K/uL   Neutrophils Relative % 64  43 - 77 %   Neutro Abs 5.8  1.7 - 7.7 K/uL   Lymphocytes Relative 26  12 - 46 %   Lymphs Abs 2.4  0.7 - 4.0 K/uL   Monocytes Relative 9  3 - 12 %   Monocytes Absolute 0.8  0.1 - 1.0 K/uL   Eosinophils Relative 2  0 - 5 %   Eosinophils Absolute 0.1  0.0 - 0.7 K/uL   Basophils Relative 0  0 - 1 %   Basophils Absolute 0.0  0.0 - 0.1 K/uL     Studies/Results: Ct Abdomen Pelvis W Contrast  04/25/2013   CLINICAL DATA:  Worsening abdominal pain. Previous laparoscopic ventral hernia repair. History of prostate cancer.  EXAM: CT ABDOMEN AND PELVIS WITH CONTRAST  TECHNIQUE: Multidetector CT imaging of the abdomen and pelvis was performed using the standard protocol following bolus administration of intravenous contrast.  CONTRAST:  159mL OMNIPAQUE IOHEXOL 300 MG/ML  SOLN  COMPARISON:  12/23/2011  FINDINGS: There is  linear atelectasis or scarring at both lung bases. No pleural or pericardial fluid.  There is air/ gas in the subcutaneous tissues consistent with recent laparoscopic surgery. None of this appears loculated or under tension. There is a small amount of intraperitoneal air in the nondependent peritoneal space. Again, this consistent with recent laparoscopic surgery. There is a fluid collection associated with the anterior abdominal wall just to the left of and above the umbilicus, presumably the site of hernia repair. In the immediate postoperative setting, it is difficulty to precisely evaluate this, but it does look like a defect containing a small amount of fluid. No bowel enters this area and there is no evidence of bowel obstruction or bowel injury.  The liver has a normal appearance without focal lesions or biliary ductal dilatation. No calcified gallstones. The spleen is normal. The pancreas is normal. The adrenal glands are normal. The kidneys are normal. The aorta shows mild atherosclerosis but no aneurysm. The IVC is normal. No retroperitoneal mass or adenopathy. The appendix is normal. Bladder is normal. Previous prostatectomy. No evidence of mass or adenopathy. Chronic ordinary degenerative changes affect the spine.  IMPRESSION: Findings typically associated with recent laparoscopic surgery. Air/gas within the subcutaneous fat of the anterior abdominal wall, not showing evidence of loculation or tension. Anterior abdominal wall abnormality to the left of midline just below the level of the umbilicus that presumably represents the treated hernia. Presently, there is some fluid in that area. I think it is difficult to accurately evaluate a treated hernia in the acute setting. I can not exclude the possibility that there continues to be an abdominal wall defect by imaging at this time however. Certainly, however, this does not contain bowel and there is no evidence of any bowel obstruction, edema or injury.    Electronically Signed   By: Nelson Chimes M.D.   On: 04/25/2013 10:14   Dg Abd Portable 1v  04/25/2013   CLINICAL DATA:  And lumbar abdominal pain and distension. Ventral hernia repair 2 days ago.  EXAM: PORTABLE ABDOMEN - 1 VIEW  COMPARISON:  Cystogram 01/04/2012  FINDINGS: The study is degraded by bandaging or support wrap over the abdomen. The bowel gas pattern is unremarkable. There is no evidence for obstruction or free air. Mild degenerative changes are present in the lower lumbar spine.  IMPRESSION: 1. Nonspecific bowel gas pattern without evidence for obstruction or free air. 2. Mild degenerative changes within the lower  lumbar spine.   Electronically Signed   By: Lawrence Santiago M.D.   On: 04/25/2013 08:35    . alfuzosin  10 mg Oral Q breakfast  . docusate sodium  100 mg Oral Daily  . enoxaparin (LOVENOX) injection  40 mg Subcutaneous Q24H  . glimepiride  4 mg Oral BID WC  . insulin aspart  0-15 Units Subcutaneous TID WC  . insulin glargine  10 Units Subcutaneous QHS  . irbesartan  75 mg Oral Daily  . levothyroxine  125 mcg Oral QAC breakfast  . pantoprazole  40 mg Oral BID     Assessment/Plan: s/p Procedure(s): LAPAROSCOPIC INCISIONAL HERNIA INSERTION OF MESH  POD#3. Laparoscopic repair of incarcerated incisional hernia with mesh. Stable  Good progress last 24 hours. No clinical or radiographic evidence of complication. We discussed discharge and he and his wife would like to go home so long as he can have a prescription for Percocet. I think this is reasonable and we will discharge him home today.   Urinary retention. History robotic prostatectomy 2013. Possible bladder neck stricture. Voiding improved.  Appreciate urology consultation and advice  Observe  Followup with Dr. Risa Grill a couple of months.  Past history of myasthenia gravis. Currently on no medication.  Non-insulin-dependent diabetes mellitus. continue Amaryl and SSI.  GERD. Continue PPI.    @PROBHOSP @   LOS: 3 days    [name] 04/26/2013  . .prob

## 2013-04-26 NOTE — ED Notes (Signed)
Patient is alert and oriented x3.  He was given DC instructions and follow up visit instructions.  Patient gave verbal understanding.  He was DC ambulatory under his own power to home.  V/S stable.  He was not showing any signs of distress on DC 

## 2013-04-26 NOTE — Discharge Instructions (Signed)
Acute Urinary Retention, Male °Acute urinary retention is the temporary inability to urinate. °This is a common problem in older men. As men age their prostates become larger and block the flow of urine from the bladder. This is usually a problem that has come on gradually.  °HOME CARE INSTRUCTIONS °If you are sent home with a Foley catheter and a drainage system, you will need to discuss the best course of action with your health care provider. While the catheter is in, maintain a good intake of fluids. Keep the drainage bag emptied and lower than your catheter. This is so that contaminated urine will not flow back into your bladder, which could lead to a urinary tract infection. °There are two main types of drainage bags. One is a large bag that usually is used at night. It has a good capacity that will allow you to sleep through the night without having to empty it. The second type is called a leg bag. It has a smaller capacity, so it needs to be emptied more frequently. However, the main advantage is that it can be attached by a leg strap and can go underneath your clothing, allowing you the freedom to move about or leave your home. °Only take over-the-counter or prescription medicines for pain, discomfort, or fever as directed by your health care provider.  °SEEK MEDICAL CARE IF: °· You develop a low-grade fever. °· You experience spasms or leakage of urine with the spasms. °SEEK IMMEDIATE MEDICAL CARE IF:  °· You develop chills or fever. °· Your catheter stops draining urine. °· Your catheter falls out. °· You start to develop increased bleeding that does not respond to rest and increased fluid intake. °MAKE SURE YOU: °· Understand these instructions. °· Will watch your condition. °· Will get help right away if you are not doing well or get worse. °Document Released: 03/29/2000 Document Revised: 08/23/2012 Document Reviewed: 06/01/2012 °[employer]® Patient Information ©2014 [employer]. ° °

## 2013-04-26 NOTE — Telephone Encounter (Signed)
Patient calling into office at 4:39pm to report that he's not been able to void for the last 5 hours and is in a lot of pain.  Patient advised to go to the ER for Evaluation of Foley Catheter Placement.  Patient states he would not go to the ER, patient states he was told by Dr. [name] to call our office if he has any difficulty voiding.  Patient called Dr. Cy Blamer office which advised him to go to the ER as I stated above and the patient did agree to go for further evaluation.  Patient s/p Laparoscopic repair of incarcerated incisional hernia with mesh on 04/23/13.

## 2013-04-26 NOTE — Discharge Summary (Signed)
Patient ID: Mario Burns [id number] 68 y.o. 05/11/1945  Admit date: 04/23/2013  Discharge date and time: 04/26/2013  Admitting Physician: [name]  Discharge Physician: [name]  Admission Diagnoses: incisional hernia   Discharge Diagnoses: Incarcerated incisional hernia Postop urinary retention, resolving History robotic prostatectomy with bladder next picture, followed by retrograde the Past history of myasthenia gravis, on no medication X. History non-insulin-dependent diabetes mellitus GERD   Operations: Procedure(s): LAPAROSCOPIC INCISIONAL HERNIA INSERTION OF MESH  Admission Condition: good  Discharged Condition: good  Indication for Admission: QUASEAN FRYE is a 68 y.o. male. He was referred by Dr. Daiva Eves in [facility] for evaluation of an incisional hernia.  The patient underwent robotic prostatectomy by Dr. Risa Grill in December of 2013. That went well. About 4 months later he noticed a bulge to the left of his umbilicus there were the incisions. This is gradually enlarging and clearly is bigger when he eats. Not much pain. No alteration in bowel habits. No imaging studies. Exam reveals a partially reducible hernia to the left of the umbilicus centered around a vertical scar, presumably a laparoscopic extraction site.  He states he was evaluated for what sounds like diastases recti 15 years ago. .  Comorbidities include robotic prostatectomy for prostate cancer by Dr. Risa Grill in December 2013, HTN, NIDDM, GERD. Otherwise doing well. No prior history of hernia.    Hospital Course: On the day of admission the patient was taken to the operating room and underwent a laparoscopic repair of incarcerated incisional hernia with mesh. There was incarcerated omentum which was taken down uneventfully. 15 cm diameter mesh was placed in the usual fashion. The surgery was uneventful. Postoperatively the patient had problems with urinary retention and was  followed closely by the urology service. This improved, almost back to baseline. He never required an indwelling Foley, the bladder neck contracture is suspected and will be following up with Dr. Risa Grill this summer. He had a lot of pain the first day or 2 after surgery although there was no other sign of any complication. The pain was so severe that we went ahead with plain abdominal films and CT scan, which were reassuringly normal. His pain improved. He became ambulatory in the halls, resumes diet, and began having bowel movements. He felt ready to go home on April 23. At the time of discharge his abdomen was soft but diffusely tender with good bowel sounds and wounds looked good. In terms of his diabetes he had some blood sugar is greater than 200 which came back down to a reasonably normal postop range of 146 after adding Lantus at bedtime. I've asked him to followup with Dr. Lisbeth Ply, his PCP, to discuss management of his diabetes and advised him to see Dr. Lisbeth Ply within one week. Diet and activities were discussed. He will return to see me in 3 weeks. He was given a prescription for Percocet, 40 tablets. Otherwise he will continue his usual medications.  Consults: urology  Significant Diagnostic Studies: Laboratory evaluation. Numerous bladder scans. CT scan.  Treatments: surgery: Laparoscopic repair of incarcerated incisional hernia with mesh  Disposition: Home  Patient Instructions:    Medication List         bisacodyl 5 MG EC tablet  Generic drug:  bisacodyl  Take 5 mg by mouth daily as needed for moderate constipation.     docusate sodium 100 MG capsule  Commonly known as:  COLACE  Take 100 mg by mouth daily.  glimepiride 4 MG tablet  Commonly known as:  AMARYL  Take 4 mg by mouth 2 (two) times daily.     levothyroxine 125 MCG tablet  Commonly known as:  SYNTHROID, LEVOTHROID  Take 125 mcg by mouth every morning.     naproxen sodium 220 MG tablet  Commonly known as:   ANAPROX  Take 440 mg by mouth every morning.     olmesartan 20 MG tablet  Commonly known as:  BENICAR  Take 20 mg by mouth every morning.     omeprazole 20 MG capsule  Commonly known as:  PRILOSEC  Take 20 mg by mouth daily.     oxyCODONE-acetaminophen 7.5-325 MG per tablet  Commonly known as:  PERCOCET  Take 1 tablet by mouth every 4 (four) hours as needed for pain.        Activity: unlimited ambulation. May walk up stairs. No lifting more than 20 pounds. No driving. May shower. Diet: carb modify the diet Wound Care: none needed  Follow-up:  With Dr. [name] in 3 weeks.  Signed: [name]. [name], M.D., FACS General and minimally invasive surgery Breast and Colorectal Surgery  04/26/2013, 5:45 AM

## 2013-04-26 NOTE — Telephone Encounter (Signed)
Dr. Risa Grill and his urology colleagues agree that they should manage all voiding problems, as patient has had a prostatectomy and has bladder neck contracture.  They followed him daily during recent hospital;lization for hernia repair.  hmi

## 2013-04-26 NOTE — ED Notes (Signed)
Pt went to the bathroom and was able to urinate. He did not catch a specimen though.   The Pt said while he was sitting in the bathroom, "it would dribble out a little at a time like it usually does."

## 2013-04-26 NOTE — ED Notes (Signed)
Pt presents today with urinary retention. Pt states he called [facility] and was instructed to present to the ED. Pt states that multiple attempts at placing a catheter during his last surgery were unsuccessful due to scar tissue. Pt reports having a hernia repair on Monday, which he was discharged today. Pt states he hasn't was able to void "just a little" at 1630. Pt is A/O x4.

## 2013-04-26 NOTE — ED Provider Notes (Signed)
CSN: [id number]     Arrival date & time 04/26/13  1736 History   First MD Initiated Contact with Patient 04/26/13 1805     Chief Complaint  Patient presents with  . Urinary Retention     (Consider location/radiation/quality/duration/timing/severity/associated sxs/prior Treatment) HPI 68 year old male presents with acute urinary retention. This is a recurrent problem for him. He states he was recently admitted for hernia surgery and had issues with eating getting a catheter in. He has had multiple prostate surgeries and procedures were prostate cancer. His urologist is Dr. Risa Grill. He was able urinate on his own well yesterday but this morning had some difficulty. They did still discharge him and since he got home he said very little urine output. Is not having lower abdominal pain and swelling like previous urinary retention. No vomiting or fevers.  Past Medical History  Diagnosis Date  . Hypertension   . Hypothyroidism   . Shortness of breath     ALWAYS--HAD FOR YEARS - STATES HE CAN'T DO HARDLY ANYTHING WITHOUT GIVING OUT  . GERD (gastroesophageal reflux disease)   . H/O hiatal hernia   . Arthritis   . OSA (obstructive sleep apnea)     NON-COMPLIANT CPAP--  CLAUSTROPHOBIC  . Type 2 diabetes mellitus   . Frequency of urination   . Urgency of urination   . Nocturia   . Neuromuscular disorder     PT STATES MYASTHENIA GRAVIS DX FEB 2013 BY DR. Joesph July STATES HE WAS HAVING PROBLEMS CHEWING CERTAIN FOODS AND UNABLE TO OPEN HIS MOUTH WIDE. - PT TOOK MEDICATION PRESCRIBED BY DR. Jannifer Franklin FOR A SHORT TIME- BUT CHEWING IMPROVED AND HE STOPPED MEDICATION BECAUSE OF THE SIDE EFFECTS.  PT STATE HE HAS NOTICED THAT HIS ISSUES HAVE RESOLVED.  Marland Kitchen Borderline hypercholesterolemia   . Cancer 2013    prostate  . Claustrophobia    Past Surgical History  Procedure Laterality Date  . Hemorrhoid surgery  1990's  . Robot assisted laparoscopic radical prostatectomy  12/15/2011    Procedure: [place];  Surgeon: Bernestine Amass, MD;  Location: [facility];  Service: Urology;  Laterality: N/A;  . Total thyroidectomy  05-24-2003  . Cystoscopy with urethral dilatation N/A 08/02/2012    Procedure: CYSTOSCOPY WITH URETHRAL DILATATION, AN REMOVAL STAPLE FROM BLADDER NECK;  Surgeon: Bernestine Amass, MD;  Location: [facility];  Service: Urology;  Laterality: N/A;  . Incisional hernia repair N/A 04/23/2013    Procedure: LAPAROSCOPIC INCISIONAL HERNIA;  Surgeon: [name], MD;  Location: [facility];  Service: General;  Laterality: N/A;  . Insertion of mesh N/A 04/23/2013    Procedure: INSERTION OF MESH;  Surgeon: [name], MD;  Location: [facility];  Service: General;  Laterality: N/A;   No family history on file. History  Substance Use Topics  . Smoking status: Never Smoker   . Smokeless tobacco: Never Used  . Alcohol Use: No    Review of Systems  Constitutional: Negative for fever.  Gastrointestinal: Positive for abdominal pain and abdominal distention. Negative for vomiting.  Genitourinary: Positive for decreased urine volume.  Musculoskeletal: Negative for back pain.  All other systems reviewed and are negative.     Allergies  Sulfa antibiotics  Home Medications   Prior to Admission medications   Medication Sig Start Date End Date Taking? Authorizing Provider  bisacodyl (BISACODYL) 5 MG EC tablet Take 5 mg by mouth daily as needed for moderate constipation.   Yes Historical Provider, MD  docusate sodium (COLACE) 100 MG capsule Take 100 mg by mouth daily.   Yes Historical Provider, MD  glimepiride (AMARYL) 4 MG tablet Take 4 mg by mouth 2 (two) times daily.   Yes Historical Provider, MD  levothyroxine (SYNTHROID, LEVOTHROID) 125 MCG tablet Take 125 mcg by mouth every morning.   Yes Historical Provider, MD  naproxen sodium (ANAPROX) 220 MG tablet Take 440 mg by mouth every morning.    Yes Historical Provider, MD  olmesartan  (BENICAR) 20 MG tablet Take 20 mg by mouth every morning.   Yes Historical Provider, MD  omeprazole (PRILOSEC) 20 MG capsule Take 20 mg by mouth daily.    Yes Historical Provider, MD  oxyCODONE-acetaminophen (PERCOCET) 7.5-325 MG per tablet Take 1 tablet by mouth every 4 (four) hours as needed for pain. 04/26/13  Yes [name], MD   BP 129/75  Pulse 78  Temp(Src) 99.5 F (37.5 C) (Oral)  Resp 20  SpO2 97% Physical Exam  Nursing note and vitals reviewed. Constitutional: He is oriented to person, place, and time. He appears well-developed and well-nourished. No distress.  HENT:  Head: Normocephalic and atraumatic.  Right Ear: External ear normal.  Left Ear: External ear normal.  Nose: Nose normal.  Eyes: Right eye exhibits no discharge. Left eye exhibits no discharge.  Neck: Neck supple.  Cardiovascular: Normal rate, regular rhythm, normal heart sounds and intact distal pulses.   Pulmonary/Chest: Effort normal.  Abdominal: Soft. There is tenderness in the suprapubic area.  Abdominal binder in place. Ecchymosis and healing wounds from recent surgery  Musculoskeletal: He exhibits no edema.  Neurological: He is alert and oriented to person, place, and time.  Skin: Skin is warm and dry.    ED Course  Procedures (including critical care time) Labs Review Labs Reviewed  URINALYSIS, ROUTINE W REFLEX MICROSCOPIC    Imaging Review Ct Abdomen Pelvis W Contrast  04/25/2013   CLINICAL DATA:  Worsening abdominal pain. Previous laparoscopic ventral hernia repair. History of prostate cancer.  EXAM: CT ABDOMEN AND PELVIS WITH CONTRAST  TECHNIQUE: Multidetector CT imaging of the abdomen and pelvis was performed using the standard protocol following bolus administration of intravenous contrast.  CONTRAST:  117mL OMNIPAQUE IOHEXOL 300 MG/ML  SOLN  COMPARISON:  12/23/2011  FINDINGS: There is linear atelectasis or scarring at both lung bases. No pleural or pericardial fluid.  There is air/ gas  in the subcutaneous tissues consistent with recent laparoscopic surgery. None of this appears loculated or under tension. There is a small amount of intraperitoneal air in the nondependent peritoneal space. Again, this consistent with recent laparoscopic surgery. There is a fluid collection associated with the anterior abdominal wall just to the left of and above the umbilicus, presumably the site of hernia repair. In the immediate postoperative setting, it is difficulty to precisely evaluate this, but it does look like a defect containing a small amount of fluid. No bowel enters this area and there is no evidence of bowel obstruction or bowel injury.  The liver has a normal appearance without focal lesions or biliary ductal dilatation. No calcified gallstones. The spleen is normal. The pancreas is normal. The adrenal glands are normal. The kidneys are normal. The aorta shows mild atherosclerosis but no aneurysm. The IVC is normal. No retroperitoneal mass or adenopathy. The appendix is normal. Bladder is normal. Previous prostatectomy. No evidence of mass or adenopathy. Chronic ordinary degenerative changes affect the spine.  IMPRESSION: Findings typically associated with recent laparoscopic surgery. Air/gas within the  subcutaneous fat of the anterior abdominal wall, not showing evidence of loculation or tension. Anterior abdominal wall abnormality to the left of midline just below the level of the umbilicus that presumably represents the treated hernia. Presently, there is some fluid in that area. I think it is difficult to accurately evaluate a treated hernia in the acute setting. I can not exclude the possibility that there continues to be an abdominal wall defect by imaging at this time however. Certainly, however, this does not contain bowel and there is no evidence of any bowel obstruction, edema or injury.   Electronically Signed   By: Nelson Chimes M.D.   On: 04/25/2013 10:14   Dg Abd Portable  1v  04/25/2013   CLINICAL DATA:  And lumbar abdominal pain and distension. Ventral hernia repair 2 days ago.  EXAM: PORTABLE ABDOMEN - 1 VIEW  COMPARISON:  Cystogram 01/04/2012  FINDINGS: The study is degraded by bandaging or support wrap over the abdomen. The bowel gas pattern is unremarkable. There is no evidence for obstruction or free air. Mild degenerative changes are present in the lower lumbar spine.  IMPRESSION: 1. Nonspecific bowel gas pattern without evidence for obstruction or free air. 2. Mild degenerative changes within the lower lumbar spine.   Electronically Signed   By: Lawrence Santiago M.D.   On: 04/25/2013 08:35     EKG Interpretation None      MDM   Final diagnoses:  Urinary retention    Discussed with Dr. Janice Norrie, who recommends nursing try a 43 French catheter. Our nurses were unable to pass this catheter. Patient is unable to void significantly on his own. Dr. Mcarthur Rossetti he then came in to relieve the obstruction himself through several dilations. He apparently did not want urine sent and patient was instructed to call Dr. Risa Grill tomorrow for close follow up.    Ephraim Hamburger, MD 04/26/13 [phone]

## 2013-04-26 NOTE — Discharge Instructions (Signed)
-  see above 

## 2013-04-26 NOTE — ED Notes (Signed)
Will update vitals when urology is finished at the bedside.

## 2013-04-27 NOTE — Telephone Encounter (Signed)
Pt calling today to report that he had a tramatic experience at the hospital last night with getting the foley catheter in place. The pt is really upset with how much pain was caused by getting the catheter in place. The pt did get home at 12:30 last night but is wandering why they didn't get abx's. I advised that we normally don't prescribe abx's after surgery or with a foley placement unless if the pt is having wound issues. The pt is concerned with feeling a knot at one of the incision site's b/c of all the stretching the ER made him do last night with trying to get the foley in place. The pt has placed a call to Dr Risa Grill this am to report how the foley is going and to report the knot he is concerned about. The pt does not want to go back to the ER. I advised pt that he should not have to go back to the ER b/c we are here in the office today. I advised pt's wife after he hears from Dr Risa Grill if the pt needs to be seen today for them to call our office back. The pt's wife understands.

## 2013-04-27 NOTE — Consult Note (Signed)
Urology Consult  Referring physician: Dr. Sherwood Gambler Reason for referral: Unable to void  Chief Complaint: Inability to urinate  History of Present Illness: Patient is a 68 years old male who came to the emergency room this evening complaining of difficulty voiding since this morning. He was discharged home after hernia repair on 4/22. Since he came to the emergency room he has voided a small amount of urine and had moderate suprapubic discomfort. The nurses were unable to insert a #16 [place] catheter in the bladder and I was asked to see him in consultation. He had a radical prostatectomy in December 2013 and had dilation of the bladder neck in July 2014. On physical examination he had tenderness in the suprapubic area. I passed the flexible cystoscope through the urethra. He has a stricture at the bladder neck and I was not able to pass the cystoscope in the bladder. I passed a sensor wire through the cystoscope in the bladder. I then dilated the bladder neck with Hayman's  dilators up to #20 [place]. I was not able to pass a #18 [place] council catheter in the bladder. I then passed a #16 [place] without difficulty. The catheter was left to straight drainage. A large amount of urine came out during the dilation and after insertion of Foley had about the 300 cc of urine in the bag. The urine was clear.  Past Medical History  Diagnosis Date  . Hypertension   . Hypothyroidism   . Shortness of breath     ALWAYS--HAD FOR YEARS - STATES HE CAN'T DO HARDLY ANYTHING WITHOUT GIVING OUT  . GERD (gastroesophageal reflux disease)   . H/O hiatal hernia   . Arthritis   . OSA (obstructive sleep apnea)     NON-COMPLIANT CPAP--  CLAUSTROPHOBIC  . Type 2 diabetes mellitus   . Frequency of urination   . Urgency of urination   . Nocturia   . Neuromuscular disorder     PT STATES MYASTHENIA GRAVIS DX FEB 2013 BY DR. Joesph July STATES HE WAS HAVING PROBLEMS CHEWING CERTAIN FOODS AND UNABLE TO OPEN HIS MOUTH  WIDE. - PT TOOK MEDICATION PRESCRIBED BY DR. Jannifer Franklin FOR A SHORT TIME- BUT CHEWING IMPROVED AND HE STOPPED MEDICATION BECAUSE OF THE SIDE EFFECTS.  PT STATE HE HAS NOTICED THAT HIS ISSUES HAVE RESOLVED.  Marland Kitchen Borderline hypercholesterolemia   . Cancer 2013    prostate  . Claustrophobia    Past Surgical History  Procedure Laterality Date  . Hemorrhoid surgery  1990's  . Robot assisted laparoscopic radical prostatectomy  12/15/2011    Procedure: [place];  Surgeon: Bernestine Amass, MD;  Location: [facility];  Service: Urology;  Laterality: N/A;  . Total thyroidectomy  05-24-2003  . Cystoscopy with urethral dilatation N/A 08/02/2012    Procedure: CYSTOSCOPY WITH URETHRAL DILATATION, AN REMOVAL STAPLE FROM BLADDER NECK;  Surgeon: Bernestine Amass, MD;  Location: [facility];  Service: Urology;  Laterality: N/A;  . Incisional hernia repair N/A 04/23/2013    Procedure: LAPAROSCOPIC INCISIONAL HERNIA;  Surgeon: [name], MD;  Location: [facility];  Service: General;  Laterality: N/A;  . Insertion of mesh N/A 04/23/2013    Procedure: INSERTION OF MESH;  Surgeon: [name], MD;  Location: [facility];  Service: General;  Laterality: N/A;    Medications: Glimepiride, insulin, Avapro, levothyroxine, Protonix, Percocet 5/325 Allergies:  Allergies  Allergen Reactions  . Sulfa Antibiotics Other (See Comments)    unknown  No family history on file. Social History:  reports that he has never smoked. He has never used smokeless tobacco. He reports that he does not drink alcohol or use illicit drugs.  ROS: All systems are reviewed and negative except as noted.   Physical Exam:  Vital signs in last 24 hours: Temp:  [97.6 F (36.4 C)-99.5 F (37.5 C)] 99.5 F (37.5 C) (04/23 1745) Pulse Rate:  [78-84] 79 (04/23 2307) Resp:  [16-20] 16 (04/23 2307) BP: (96-129)/(51-75) 96/51 mmHg (04/23 2307) SpO2:  [93 %-97 %] 93 % (04/23 2307) HEENT: Within  normal limits Cardiovascular: Skin warm; not flushed Respiratory: Breaths quiet; no shortness of breath Abdomen: No masses Neurological: Normal sensation to touch Musculoskeletal: Normal motor function arms and legs Lymphatics: No inguinal adenopathy Skin: No rashes Genitourinary: Penis is circumcised. Meatus is normal. Scrotum is normal in appearance. There is no testicular mass. Rectal examination: Deferred  Laboratory Data:  Results for orders placed during the hospital encounter of 04/23/13 (from the past 72 hour(s))  CBC     Status: Abnormal   Collection Time    04/24/13  4:50 AM      Result Value Ref Range   WBC 11.6 (*) 4.0 - 10.5 K/uL   RBC 4.14 (*) 4.22 - 5.81 MIL/uL   Hemoglobin 12.4 (*) 13.0 - 17.0 g/dL   HCT 37.2 (*) 39.0 - 52.0 %   MCV 89.9  78.0 - 100.0 fL   MCH 30.0  26.0 - 34.0 pg   MCHC 33.3  30.0 - 36.0 g/dL   RDW 13.2  11.5 - 15.5 %   Platelets 229  150 - 400 K/uL  BASIC METABOLIC PANEL     Status: Abnormal   Collection Time    04/24/13  4:50 AM      Result Value Ref Range   Sodium 135 (*) 137 - 147 mEq/L   Potassium 4.1  3.7 - 5.3 mEq/L   Chloride 99  96 - 112 mEq/L   CO2 24  19 - 32 mEq/L   Glucose, Bld 201 (*) 70 - 99 mg/dL   BUN 21  6 - 23 mg/dL   Creatinine, Ser 0.71  0.50 - 1.35 mg/dL   Calcium 8.4  8.4 - 10.5 mg/dL   GFR calc non Af Amer >90  >90 mL/min   GFR calc Af Amer >90  >90 mL/min   Comment: (NOTE)     The eGFR has been calculated using the CKD EPI equation.     This calculation has not been validated in all clinical situations.     eGFR's persistently <90 mL/min signify possible Chronic Kidney     Disease.  GLUCOSE, CAPILLARY     Status: Abnormal   Collection Time    04/24/13  7:20 AM      Result Value Ref Range   Glucose-Capillary 206 (*) 70 - 99 mg/dL   Comment 1 Notify RN     Comment 2 Documented in Chart    GLUCOSE, CAPILLARY     Status: Abnormal   Collection Time    04/24/13 11:41 AM      Result Value Ref Range    Glucose-Capillary 165 (*) 70 - 99 mg/dL   Comment 1 Notify RN     Comment 2 Documented in Chart    GLUCOSE, CAPILLARY     Status: Abnormal   Collection Time    04/24/13  3:42 PM      Result Value Ref Range   Glucose-Capillary  237 (*) 70 - 99 mg/dL   Comment 1 Notify RN     Comment 2 Documented in Chart    GLUCOSE, CAPILLARY     Status: Abnormal   Collection Time    04/24/13  9:55 PM      Result Value Ref Range   Glucose-Capillary 141 (*) 70 - 99 mg/dL  CBC     Status: Abnormal   Collection Time    04/25/13  4:39 AM      Result Value Ref Range   WBC 8.4  4.0 - 10.5 K/uL   RBC 4.05 (*) 4.22 - 5.81 MIL/uL   Hemoglobin 12.0 (*) 13.0 - 17.0 g/dL   HCT 36.9 (*) 39.0 - 52.0 %   MCV 91.1  78.0 - 100.0 fL   MCH 29.6  26.0 - 34.0 pg   MCHC 32.5  30.0 - 36.0 g/dL   RDW 12.9  11.5 - 15.5 %   Platelets 214  150 - 400 K/uL  BASIC METABOLIC PANEL     Status: Abnormal   Collection Time    04/25/13  4:39 AM      Result Value Ref Range   Sodium 134 (*) 137 - 147 mEq/L   Potassium 4.5  3.7 - 5.3 mEq/L   Chloride 101  96 - 112 mEq/L   CO2 27  19 - 32 mEq/L   Glucose, Bld 177 (*) 70 - 99 mg/dL   BUN 16  6 - 23 mg/dL   Creatinine, Ser 0.76  0.50 - 1.35 mg/dL   Calcium 8.5  8.4 - 10.5 mg/dL   GFR calc non Af Amer >90  >90 mL/min   GFR calc Af Amer >90  >90 mL/min   Comment: (NOTE)     The eGFR has been calculated using the CKD EPI equation.     This calculation has not been validated in all clinical situations.     eGFR's persistently <90 mL/min signify possible Chronic Kidney     Disease.  HEMOGLOBIN A1C     Status: Abnormal   Collection Time    04/25/13  4:39 AM      Result Value Ref Range   Hemoglobin A1C 9.2 (*) <5.7 %   Comment: (NOTE)                                                                               According to the ADA Clinical Practice Recommendations for 2011, when     HbA1c is used as a screening test:      >=6.5%   Diagnostic of Diabetes Mellitus                (if abnormal result is confirmed)     5.7-6.4%   Increased risk of developing Diabetes Mellitus     References:Diagnosis and Classification of Diabetes Mellitus,Diabetes     AJOI,7867,67(MCNOB 1):S62-S69 and Standards of Medical Care in             Diabetes - 2011,Diabetes Care,2011,34 (Suppl 1):S11-S61.   Mean Plasma Glucose 217 (*) <117 mg/dL   Comment: Performed at [place], CAPILLARY     Status: Abnormal   Collection Time  04/25/13  7:20 AM      Result Value Ref Range   Glucose-Capillary 149 (*) 70 - 99 mg/dL   Comment 1 Notify RN     Comment 2 Documented in Chart    GLUCOSE, CAPILLARY     Status: Abnormal   Collection Time    04/25/13 11:23 AM      Result Value Ref Range   Glucose-Capillary 132 (*) 70 - 99 mg/dL   Comment 1 Notify RN     Comment 2 Documented in Chart    GLUCOSE, CAPILLARY     Status: Abnormal   Collection Time    04/25/13  3:29 PM      Result Value Ref Range   Glucose-Capillary 166 (*) 70 - 99 mg/dL   Comment 1 Notify RN     Comment 2 Documented in Chart    GLUCOSE, CAPILLARY     Status: Abnormal   Collection Time    04/25/13  9:36 PM      Result Value Ref Range   Glucose-Capillary 146 (*) 70 - 99 mg/dL  CBC WITH DIFFERENTIAL     Status: Abnormal   Collection Time    04/26/13  4:27 AM      Result Value Ref Range   WBC 9.2  4.0 - 10.5 K/uL   RBC 4.16 (*) 4.22 - 5.81 MIL/uL   Hemoglobin 12.7 (*) 13.0 - 17.0 g/dL   HCT 37.6 (*) 39.0 - 52.0 %   MCV 90.4  78.0 - 100.0 fL   MCH 30.5  26.0 - 34.0 pg   MCHC 33.8  30.0 - 36.0 g/dL   RDW 12.8  11.5 - 15.5 %   Platelets 249  150 - 400 K/uL   Neutrophils Relative % 64  43 - 77 %   Neutro Abs 5.8  1.7 - 7.7 K/uL   Lymphocytes Relative 26  12 - 46 %   Lymphs Abs 2.4  0.7 - 4.0 K/uL   Monocytes Relative 9  3 - 12 %   Monocytes Absolute 0.8  0.1 - 1.0 K/uL   Eosinophils Relative 2  0 - 5 %   Eosinophils Absolute 0.1  0.0 - 0.7 K/uL   Basophils Relative 0  0 - 1 %   Basophils Absolute  0.0  0.0 - 0.1 K/uL  BASIC METABOLIC PANEL     Status: Abnormal   Collection Time    04/26/13  4:27 AM      Result Value Ref Range   Sodium 136 (*) 137 - 147 mEq/L   Potassium 4.1  3.7 - 5.3 mEq/L   Chloride 101  96 - 112 mEq/L   CO2 26  19 - 32 mEq/L   Glucose, Bld 140 (*) 70 - 99 mg/dL   BUN 13  6 - 23 mg/dL   Creatinine, Ser 0.73  0.50 - 1.35 mg/dL   Calcium 8.6  8.4 - 10.5 mg/dL   GFR calc non Af Amer >90  >90 mL/min   GFR calc Af Amer >90  >90 mL/min   Comment: (NOTE)     The eGFR has been calculated using the CKD EPI equation.     This calculation has not been validated in all clinical situations.     eGFR's persistently <90 mL/min signify possible Chronic Kidney     Disease.  GLUCOSE, CAPILLARY     Status: Abnormal   Collection Time    04/26/13  8:02 AM      Result Value  Ref Range   Glucose-Capillary 153 (*) 70 - 99 mg/dL   Comment 1 Notify RN     No results found for this or any previous visit (from the past 240 hour(s)). Creatinine:  Recent Labs  04/23/13 1230 04/24/13 0450 04/25/13 0439 04/26/13 0427  CREATININE 0.79 0.71 0.76 0.73      Impression/Assessment:  Urinary retention, bladder neck contracture. Status post radical prostatectomy  Plan:  Leave Foley indwelling. Follow up with Dr. Vanessa Kick Daphnee Preiss 04/27/2013, 12:04 AM   CC: Dr. Sherwood Gambler

## 2013-05-31 ENCOUNTER — Encounter (INDEPENDENT_AMBULATORY_CARE_PROVIDER_SITE_OTHER): Payer: Self-pay | Admitting: General Surgery

## 2013-05-31 ENCOUNTER — Ambulatory Visit (INDEPENDENT_AMBULATORY_CARE_PROVIDER_SITE_OTHER): Payer: Medicare PPO | Admitting: General Surgery

## 2013-05-31 VITALS — BP 123/80 | HR 68 | Temp 98.5°F | Resp 16 | Ht 69.0 in | Wt 185.0 lb

## 2013-05-31 DIAGNOSIS — K432 Incisional hernia without obstruction or gangrene: Secondary | ICD-10-CM

## 2013-05-31 NOTE — Patient Instructions (Signed)
The hernia repair appears intact. There is no sign of infection or complication.  It is not unusual to be still have some tenderness.  Take a long walk daily, but do not play sports or do any heavy lifting  Return to see Dr. [name] in 6 weeks

## 2013-05-31 NOTE — Progress Notes (Signed)
Patient ID: Mario Burns, male   DOB: 03-Oct-1945, 68 y.o.   MRN: [id number] History: This patient underwent laparoscopic repair of ventral incisional hernia with a 15 cm diameter piece of mesh on 04/23/2012. His history robotic prostatectomy with bladder neck stricture. Urinary retention and required catheter placement by the urologist and they are managing this exclusively. He had a lot of abdominal pain after catheter insertion. The catheter has now been removed and he is voiding okay. He is followed by Dr. Risa Grill. Appetite pretty good. Bowel movements normal.  Exam:  Alert. Very talkative. Wife is with him. No distress Examined supine and standing. Hernia repair is intact. Some tenderness, but no focal trigger point. No infection or fluid collections. Hernia repair and tacked   Assessment: Incarcerated incisional hernia, recovering without major direct surgical problems following laparoscopic repair with mesh History robotic prostatectomy with bladder neck stricture Postop urinary retention History myasthenia gravis History non-insulin dependent diabetes GERD.   Plan: Return to see me in 6 weeks No sports or heavy lifting until I see him next time. Lots of ambulation. Fiber low-fat diet.    [name]. [name], M.D., [facility] General and Minimally invasive Surgery Breast and Colorectal Surgery Office:   [phone] Pager:   [phone]

## 2013-06-20 ENCOUNTER — Encounter (INDEPENDENT_AMBULATORY_CARE_PROVIDER_SITE_OTHER): Payer: Self-pay

## 2013-07-12 ENCOUNTER — Ambulatory Visit (INDEPENDENT_AMBULATORY_CARE_PROVIDER_SITE_OTHER): Payer: Commercial Managed Care - HMO | Admitting: General Surgery

## 2013-07-12 ENCOUNTER — Encounter (INDEPENDENT_AMBULATORY_CARE_PROVIDER_SITE_OTHER): Payer: Self-pay | Admitting: General Surgery

## 2013-07-12 VITALS — BP 126/74 | HR 73 | Temp 98.1°F | Ht 69.0 in | Wt 182.0 lb

## 2013-07-12 DIAGNOSIS — K430 Incisional hernia with obstruction, without gangrene: Secondary | ICD-10-CM

## 2013-07-12 NOTE — Progress Notes (Signed)
Patient ID: Mario Burns, male   DOB: 02/08/1945, 68 y.o.   MRN: [id number]   History:  This patient underwent laparoscopic repair of ventral incisional hernia with a 15 cm diameter piece of mesh on 04/23/2012.  His history robotic prostatectomy with bladder neck stricture. Urinary retention  required catheter placement by the urologist and they are managing this exclusively.  He had a lot of abdominal pain after catheter insertion. The catheter has now been removed and he is voiding okay. He is followed by Dr. Risa Grill.  Appetite pretty good. Bowel movements normal.  His only complaint is that he still has left-sided abdominal pain. Some burning. He wonders if he has poor something. Appetite normal. Bowel movements normal.  Exam:  Alert. Pleasant.. No distress He looks well. His wife is with him. Examined supine and standing. Hernia repair is intact. Some tenderness , Especially when standing, but not when supine., but no focal trigger point. No infection or fluid collections. Hernia repair intact  Assessment:  Incarcerated incisional hernia, recovering without major direct surgical problems following laparoscopic repair with mesh Intermittent left-sided abdominal pain, persistent. Etiology unclear on physical exam. May just be surgical scarring History robotic prostatectomy with bladder neck stricture  Postop urinary retention  History myasthenia gravis  History NIDDM  Plan:  To be sure and reassure both the patient and me, were going to do a CT scan of abdomen and pelvis with contrast to make sure the hernia repair is intact that has been no complication. Return to see me in 3 weeks  No sports or heavy lifting until I see him next time. Lots of ambulation. Fiber low-fat diet.     [name]. [name], M.D., [facility]  General and Minimally invasive Surgery  Breast and Colorectal Surgery  Office: [phone]  Pager: [phone]

## 2013-07-12 NOTE — Patient Instructions (Signed)
You are recovering from your laparoscopic ventral hernia repair without any obvious surgical complications.  You may resume normal activities without specific restriction. Because of the risk of recurrence, I would avoid very strenuous activities and I would avoid strenuous sports.  Return to see Dr. [name] if needed.

## 2013-07-13 ENCOUNTER — Telehealth (INDEPENDENT_AMBULATORY_CARE_PROVIDER_SITE_OTHER): Payer: Self-pay | Admitting: *Deleted

## 2013-07-13 NOTE — Telephone Encounter (Signed)
Called pt to inform him of his CT Abd/Pel @ [facility] location. Spoke to pt's wife per his request.  Appt is confirmed for 07-17-13 arriving at 12:30 for labs first.    Anderson Malta

## 2013-07-17 ENCOUNTER — Ambulatory Visit
Admission: RE | Admit: 2013-07-17 | Discharge: 2013-07-17 | Disposition: A | Discharge status: Home / Self Care | Payer: Commercial Managed Care - HMO | Source: Ambulatory Visit | Attending: General Surgery | Admitting: General Surgery

## 2013-07-17 ENCOUNTER — Other Ambulatory Visit: Payer: Self-pay | Admitting: Urology

## 2013-07-17 DIAGNOSIS — K430 Incisional hernia with obstruction, without gangrene: Secondary | ICD-10-CM

## 2013-07-17 MED ORDER — IOHEXOL 300 MG/ML  SOLN
100.0000 mL | Freq: Once | INTRAMUSCULAR | Status: AC | PRN
  Administered 2013-07-17: 100 mL via INTRAVENOUS

## 2013-07-25 ENCOUNTER — Encounter (HOSPITAL_BASED_OUTPATIENT_CLINIC_OR_DEPARTMENT_OTHER): Payer: Self-pay | Admitting: *Deleted

## 2013-07-25 NOTE — Progress Notes (Signed)
SPOKE W/ WIFE. NPO AFTER MN. ARRIVE AT 3546. NEEDS ISTAT. CURRENT EKG IN CHART AND EPIC. WILL TAKE SYNTHROID, COLACE, AND BENICAR AM DOS W/ SIPS OF WATER.

## 2013-07-26 ENCOUNTER — Ambulatory Visit (INDEPENDENT_AMBULATORY_CARE_PROVIDER_SITE_OTHER): Payer: Commercial Managed Care - HMO | Admitting: General Surgery

## 2013-07-26 ENCOUNTER — Encounter (INDEPENDENT_AMBULATORY_CARE_PROVIDER_SITE_OTHER): Payer: Self-pay | Admitting: General Surgery

## 2013-07-26 VITALS — BP 112/80 | HR 68 | Temp 97.9°F | Resp 18 | Ht 69.0 in | Wt 184.0 lb

## 2013-07-26 DIAGNOSIS — K430 Incisional hernia with obstruction, without gangrene: Secondary | ICD-10-CM

## 2013-07-26 NOTE — Progress Notes (Signed)
Patient ID: Mario Burns, male   DOB: 1945-09-27, 68 y.o.   MRN: [id number]   History:  This patient underwent laparoscopic repair of ventral incisional hernia with a 15 cm diameter piece of mesh on 04/23/2012.  His history robotic prostatectomy with bladder neck stricture. Urinary retention required catheter placement by the urologist and they are managing this exclusively.  He had a lot of abdominal pain after catheter insertion. The catheter has now been removed and he is voiding okay. He is followed by Dr. Risa Grill.  It looks like he scheduled for cystoscopy with balloon dilatation on by Dr. Pearline Cables be on July 27. Appetite pretty good. Bowel movements normal.  He still notes some intermittent left-sided abdominal pain and burning sensation. He has gained 10 pounds. CT scan of abdomen and pelvis looks good. Hernia repair intact. No infection or seroma. Intestines looked good.  Exam:  Alert. Pleasant.. No distress He looks well. His wife is with him.  Examined supine and standing. Hernia repair is intact. Some tenderness , Especially when standing, but not when supine., but no focal trigger point. No infection or fluid collections. Hernia repair intact. I do not suspect nerve entrapment by suture fixation.  Assessment:  Incarcerated incisional hernia, recovering without major direct surgical problems following laparoscopic repair with mesh  Intermittent left-sided abdominal pain, persistent.Slowly improving. Suspect this is normal postop scarring and arises. Suspect this will be self-limited. History robotic prostatectomy with bladder neck stricture  Postop urinary retention  History myasthenia gravis  History NIDDM   Plan:   Patient and wife are reassured Weight reduction advised Resume normal activities Return to see me as needed.    Mario Burns. Mario Burns, M.D., [facility]  General and Minimally invasive Surgery  Breast and Colorectal Surgery  Office:  [phone]  Pager: [phone]

## 2013-07-26 NOTE — Patient Instructions (Signed)
The CT scan of your abdomen and pelvis looks good. The hernia repair is intact. There is no sign of infection. The intestines or in the right place.  I think that you are recovering from your laparoscopic incisional hernia repair with that any significant complications.  The burning sensation will also resolve over time  Return to see Dr. [name] as needed.

## 2013-07-29 NOTE — H&P (Signed)
Reason For Visit        Mario Burns returns for routine prostate cancer followup. He is approximately 18 months status post his prostatectomy. PSA was undetectable last checked in March 2015.  He continues to have some ongoing stress incontinence. One brief/pad per day usually adequate.    He did develop urinary retention about 2 months ago after his incisional hernia repair. He is continuing to have a fair amount of discomfort around his umbilicus and a CT scan has been ordered for the near future. He has noticed increased nocturia and frequency sense the procedure and has also noticed decrease in the force of urinary stream. He did require dilation in the emergency room which was very painful for him.   History of Present Illness   Past Gu Hx:          Mario Burns is status post robotic prostatectomy in December of 2013. Postoperatively he was noted to have a urine leak and needed to stay an extra night in the hospital before removal of his Jackson-Pratt drain. The patient did have an ileus which resolved. Patient's final pathology revealed bilateral Gleason 3+3 equals 6 cancer. There was an area of extraprostatic extension on the right side the margins were negative. There was no evidence of seminal vesicle involvement. Cystogram showed evidence of ongoing leakage and therefore his catheter was left for an extended time frame. He did eventually undergo a cystoscopy with voiding trial.    Mario Burns has continued to have at least moderate stress incontinence. He does void especially if he is not terribly active. The stream is relatively strong at first and then weekends. He is having no pain with voiding. Again initial PSA testing was undetectable.    Cystoscopy revealed a bladder neck contracture with what appeared to be a exposed vascular staple from the dorsal venous complex. The patient underwent removal of a small staple along with a balloon dilation of the bladder neck contracture. ( July  2014)     Past Medical History Problems  1. History of Arthritis (V13.4) 2. History of Gastric ulcer (531.90) 3. History of diabetes mellitus (V12.29) 4. History of heartburn (V12.79) 5. History of hypercholesterolemia (V12.29) 6. History of hypertension (V12.59) 7. History of sleep apnea (V13.89)  Surgical History Problems  1. History of Cystoscopy For Urethral Stricture 2. History of Cystoscopy For Urethral Stricture 3. History of Cystoscopy With Removal Of Object 4. History of Hernia Repair 5. History of Prostatect Retropubic Radical W/ Nerve Sparing Laparoscopic 6. History of Thyroid Surgery Total Thyroidectomy  Current Meds 1. Aleve TABS; 2 a day;  Therapy: (Recorded:24Oct2014) to Recorded 2. Benicar 20 MG Oral Tablet;  Therapy: (Recorded:23Aug2013) to Recorded 3. Glimepiride 4 MG Oral Tablet;  Therapy: [id number] to Recorded 4. Levothyroxine Sodium 125 MCG Oral Tablet;  Therapy: 22Mar2013 to Recorded 5. PriLOSEC 20 MG Oral Capsule Delayed Release;  Therapy: (Recorded:23Aug2013) to Recorded 6. Stool Softener TABS;  Therapy: (Recorded:15Jul2014) to Recorded  Allergies Medication  1. Sulfa Drugs  Family History Problems  1. Family history of Death In The Family Father : Father   40yrs, PCA 2. Family history of Family Health Status - Mother's Age : Mother   58yrs 3. Family history of Family Health Status Number Of Children   1 son 4. Family history of Kidney Cancer (V16.51) : Brother 5. Family history of Prostate Cancer (W09.81) : Father 6. Family history of Prostate Cancer (V16.42) : Paternal Uncle  Social History Problems  1. Activities Of Daily Living 2.  Denied: History of Alcohol Use 3. Caffeine Use   1-2 qd 4. Exercise Habits   No formal exercise but he is busy and active at work and with light chores. 5. Living Independently With Spouse 6. Marital History - Currently Married 7. Never A Smoker 8. Occupation:   self-employed 22.  Self-reliant In Usual Daily Activities 10. Denied: History of Tobacco Use  Review of Systems Genitourinary, constitutional, skin, eye, otolaryngeal, hematologic/lymphatic, cardiovascular, pulmonary, endocrine, musculoskeletal, gastrointestinal, neurological and psychiatric system(s) were reviewed and pertinent findings if present are noted.  Genitourinary: urinary frequency, nocturia, incontinence, weak urinary stream and erectile dysfunction, but no dysuria.  Gastrointestinal: abdominal pain.  Integumentary: pruritus.  ENT: sinus problems.  Endocrine: polydipsia.  Musculoskeletal: joint pain.  Psychiatric: anxiety.    Vitals Vital Signs [Data Includes: Last 1 Day]  Recorded: 34HDQ2229 09:56AM  Height: 5 ft 9 in Weight: 181 lb 12.8 oz BMI Calculated: 26.85 BSA Calculated: 1.98 Blood Pressure: 135 / 72 Temperature: 97.8 F Heart Rate: 80  Physical Exam Constitutional: Well nourished and well developed . No acute distress.  ENT:. The ears and nose are normal in appearance.  Pulmonary: No respiratory distress and normal respiratory rhythm and effort.  Cardiovascular: Heart rate and rhythm are normal . No peripheral edema.  Abdomen: The abdomen is soft and nontender. No masses are palpated. No CVA tenderness. No hernias are palpable. No hepatosplenomegaly noted.  Genitourinary: Examination of the penis demonstrates no discharge, no masses, no lesions and a normal meatus. The scrotum is without lesions. The right epididymis is palpably normal and non-tender. The left epididymis is palpably normal and non-tender. The right testis is non-tender and without masses. The left testis is non-tender and without masses.  Neuro/Psych:. Mood and affect are appropriate.    Results/Data Urine [Data Includes: Last 1 Day]   [id number]  COLOR YELLOW   APPEARANCE CLEAR   SPECIFIC GRAVITY 1.010   pH 5.5   GLUCOSE > 1000 mg/dL  BILIRUBIN NEG   KETONE NEG mg/dL  BLOOD NEG   PROTEIN NEG mg/dL   UROBILINOGEN 0.2 mg/dL  NITRITE NEG   LEUKOCYTE ESTERASE NEG    Assessment Assessed  1. Bladder neck contracture (596.0) 2. Prostate cancer (185)  Plan Prostate cancer  1. Follow-up Schedule Surgery Office  Follow-up  Status: Hold For - Appointment   Requested for: 17EYC1448 2. PSA; Status:In Progress - Specimen/Data Collected;   Done: [id number] 3. VENIPUNCTURE; Status:Complete;   Done: [id number]  Discussion/Summary   No evidence of recurrent prostate cancer. PSA is due to be rechecked today. He does have ongoing voiding issues. He is also experiencing some periumbilical pain but his clinical exam is not suggestive of anything of concern. We will await CT imaging findings. He is interested in going ahead with a repeat attempt at balloon dilation. Will want to carefully inspect his anastomosis and be sure there are no foreign bodies We may want to consider putting him on intermittent catheterization couple times a week to try to keep the bladder neck contracture open after the repeat procedure.Multimedia programmer Electronically signed by : Rana Snare, M.D.; Jul 13 2013 10:24AM EST

## 2013-07-30 ENCOUNTER — Encounter (HOSPITAL_BASED_OUTPATIENT_CLINIC_OR_DEPARTMENT_OTHER)
Admission: RE | Disposition: A | Discharge status: Home / Self Care | Payer: Self-pay | Source: Ambulatory Visit | Attending: Urology

## 2013-07-30 ENCOUNTER — Encounter (HOSPITAL_BASED_OUTPATIENT_CLINIC_OR_DEPARTMENT_OTHER): Payer: Self-pay | Admitting: Anesthesiology

## 2013-07-30 ENCOUNTER — Ambulatory Visit (HOSPITAL_BASED_OUTPATIENT_CLINIC_OR_DEPARTMENT_OTHER): Payer: Medicare PPO | Admitting: Anesthesiology

## 2013-07-30 ENCOUNTER — Encounter (HOSPITAL_BASED_OUTPATIENT_CLINIC_OR_DEPARTMENT_OTHER): Payer: Medicare PPO | Admitting: Anesthesiology

## 2013-07-30 ENCOUNTER — Ambulatory Visit (HOSPITAL_BASED_OUTPATIENT_CLINIC_OR_DEPARTMENT_OTHER)
Admission: RE | Admit: 2013-07-30 | Discharge: 2013-07-30 | Disposition: A | Discharge status: Home / Self Care | Payer: Medicare PPO | Source: Ambulatory Visit | Attending: Urology | Admitting: Urology

## 2013-07-30 DIAGNOSIS — F411 Generalized anxiety disorder: Secondary | ICD-10-CM | POA: Diagnosis not present

## 2013-07-30 DIAGNOSIS — E119 Type 2 diabetes mellitus without complications: Secondary | ICD-10-CM | POA: Insufficient documentation

## 2013-07-30 DIAGNOSIS — Z8042 Family history of malignant neoplasm of prostate: Secondary | ICD-10-CM | POA: Diagnosis not present

## 2013-07-30 DIAGNOSIS — Z8546 Personal history of malignant neoplasm of prostate: Secondary | ICD-10-CM | POA: Diagnosis not present

## 2013-07-30 DIAGNOSIS — E785 Hyperlipidemia, unspecified: Secondary | ICD-10-CM | POA: Diagnosis not present

## 2013-07-30 DIAGNOSIS — Z882 Allergy status to sulfonamides status: Secondary | ICD-10-CM | POA: Diagnosis not present

## 2013-07-30 DIAGNOSIS — N393 Stress incontinence (female) (male): Secondary | ICD-10-CM | POA: Diagnosis not present

## 2013-07-30 DIAGNOSIS — E7800 Pure hypercholesterolemia, unspecified: Secondary | ICD-10-CM | POA: Insufficient documentation

## 2013-07-30 DIAGNOSIS — G4730 Sleep apnea, unspecified: Secondary | ICD-10-CM | POA: Insufficient documentation

## 2013-07-30 DIAGNOSIS — G7000 Myasthenia gravis without (acute) exacerbation: Secondary | ICD-10-CM | POA: Insufficient documentation

## 2013-07-30 DIAGNOSIS — Z9079 Acquired absence of other genital organ(s): Secondary | ICD-10-CM | POA: Diagnosis not present

## 2013-07-30 DIAGNOSIS — I10 Essential (primary) hypertension: Secondary | ICD-10-CM | POA: Insufficient documentation

## 2013-07-30 DIAGNOSIS — N320 Bladder-neck obstruction: Secondary | ICD-10-CM | POA: Diagnosis present

## 2013-07-30 HISTORY — DX: Poor urinary stream: R39.12

## 2013-07-30 HISTORY — DX: Personal history of other endocrine, nutritional and metabolic disease: Z86.39

## 2013-07-30 HISTORY — DX: Postprocedural hypothyroidism: E89.0

## 2013-07-30 HISTORY — DX: Bladder-neck obstruction: N32.0

## 2013-07-30 HISTORY — PX: CYSTOSCOPY: SHX5120

## 2013-07-30 HISTORY — DX: Hyperlipidemia, unspecified: E78.5

## 2013-07-30 HISTORY — DX: Myasthenia gravis without (acute) exacerbation: G70.00

## 2013-07-30 LAB — POCT I-STAT 4, (NA,K, GLUC, HGB,HCT)
GLUCOSE: 205 mg/dL — AB (ref 70–99)
HCT: 46 % (ref 39.0–52.0)
HEMOGLOBIN: 15.6 g/dL (ref 13.0–17.0)
Potassium: 3.8 mEq/L (ref 3.7–5.3)
Sodium: 139 mEq/L (ref 137–147)

## 2013-07-30 LAB — GLUCOSE, CAPILLARY: Glucose-Capillary: 150 mg/dL — ABNORMAL HIGH (ref 70–99)

## 2013-07-30 SURGERY — CYSTOSCOPY
Anesthesia: General | Site: Bladder

## 2013-07-30 MED ORDER — CEFAZOLIN SODIUM-DEXTROSE 2-3 GM-% IV SOLR
2.0000 g | INTRAVENOUS | Status: AC
  Administered 2013-07-30: 2 g via INTRAVENOUS
  Filled 2013-07-30: qty 50

## 2013-07-30 MED ORDER — MIDAZOLAM HCL 5 MG/5ML IJ SOLN
INTRAMUSCULAR | Status: DC | PRN
  Administered 2013-07-30: 2 mg via INTRAVENOUS

## 2013-07-30 MED ORDER — BELLADONNA ALKALOIDS-OPIUM 16.2-60 MG RE SUPP
RECTAL | Status: DC | PRN
  Administered 2013-07-30: 1 via RECTAL

## 2013-07-30 MED ORDER — LACTATED RINGERS IV SOLN
INTRAVENOUS | Status: DC
  Filled 2013-07-30: qty 1000

## 2013-07-30 MED ORDER — PROMETHAZINE HCL 25 MG/ML IJ SOLN
6.2500 mg | INTRAMUSCULAR | Status: DC | PRN
  Filled 2013-07-30: qty 1

## 2013-07-30 MED ORDER — EPHEDRINE SULFATE 50 MG/ML IJ SOLN
INTRAMUSCULAR | Status: DC | PRN
  Administered 2013-07-30 (×2): 10 mg via INTRAVENOUS

## 2013-07-30 MED ORDER — PHENAZOPYRIDINE HCL 200 MG PO TABS: 200.0000 mg | ORAL_TABLET | Freq: Three times a day (TID) | ORAL | Status: DC | PRN

## 2013-07-30 MED ORDER — ONDANSETRON HCL 4 MG/2ML IJ SOLN
INTRAMUSCULAR | Status: DC | PRN
  Administered 2013-07-30: 4 mg via INTRAVENOUS

## 2013-07-30 MED ORDER — LACTATED RINGERS IV SOLN
INTRAVENOUS | Status: DC
Start: 2013-07-30 — End: 2013-07-30
  Administered 2013-07-30: 09:00:00 via INTRAVENOUS
  Filled 2013-07-30: qty 1000

## 2013-07-30 MED ORDER — FENTANYL CITRATE 0.05 MG/ML IJ SOLN
INTRAMUSCULAR | Status: DC | PRN
  Administered 2013-07-30: 50 ug via INTRAVENOUS
  Administered 2013-07-30 (×2): 25 ug via INTRAVENOUS

## 2013-07-30 MED ORDER — FENTANYL CITRATE 0.05 MG/ML IJ SOLN
INTRAMUSCULAR | Status: AC
  Filled 2013-07-30: qty 4

## 2013-07-30 MED ORDER — FENTANYL CITRATE 0.05 MG/ML IJ SOLN
25.0000 ug | INTRAMUSCULAR | Status: DC | PRN
  Filled 2013-07-30: qty 1

## 2013-07-30 MED ORDER — BELLADONNA ALKALOIDS-OPIUM 16.2-60 MG RE SUPP
RECTAL | Status: AC
  Filled 2013-07-30: qty 1

## 2013-07-30 MED ORDER — LIDOCAINE HCL 2 % EX GEL
CUTANEOUS | Status: DC | PRN
  Administered 2013-07-30: 1 via URETHRAL

## 2013-07-30 MED ORDER — STERILE WATER FOR IRRIGATION IR SOLN
Status: DC | PRN
  Administered 2013-07-30: 3000 mL

## 2013-07-30 MED ORDER — MIDAZOLAM HCL 2 MG/2ML IJ SOLN
INTRAMUSCULAR | Status: AC
  Filled 2013-07-30: qty 2

## 2013-07-30 MED ORDER — DEXAMETHASONE SODIUM PHOSPHATE 4 MG/ML IJ SOLN
INTRAMUSCULAR | Status: DC | PRN
  Administered 2013-07-30: 10 mg via INTRAVENOUS

## 2013-07-30 MED ORDER — PROPOFOL 10 MG/ML IV BOLUS
INTRAVENOUS | Status: DC | PRN
  Administered 2013-07-30: 180 mg via INTRAVENOUS

## 2013-07-30 MED ORDER — LIDOCAINE HCL (CARDIAC) 20 MG/ML IV SOLN
INTRAVENOUS | Status: DC | PRN
  Administered 2013-07-30: 80 mg via INTRAVENOUS

## 2013-07-30 SURGICAL SUPPLY — 24 items
BAG DRAIN URO-CYSTO SKYTR STRL (DRAIN) ×3 IMPLANT
BAG DRN UROCATH (DRAIN) ×1
BALLN NEPHROSTOMY (BALLOONS) ×3
BALLOON NEPHROSTOMY (BALLOONS) IMPLANT
CANISTER SUCT LVC 12 LTR MEDI- (MISCELLANEOUS) ×2 IMPLANT
CATH ROBINSON RED A/P 14FR (CATHETERS) IMPLANT
CATH ROBINSON RED A/P 16FR (CATHETERS) ×2 IMPLANT
CLOTH BEACON ORANGE TIMEOUT ST (SAFETY) ×3 IMPLANT
DRAPE CAMERA CLOSED 9X96 (DRAPES) ×3 IMPLANT
ELECT REM PT RETURN 9FT ADLT (ELECTROSURGICAL)
ELECTRODE REM PT RTRN 9FT ADLT (ELECTROSURGICAL) ×1 IMPLANT
GLOVE BIO SURGEON STRL SZ7.5 (GLOVE) ×6 IMPLANT
GLOVE INDICATOR 7.5 STRL GRN (GLOVE) ×6 IMPLANT
GOWN STRL REIN XL XLG (GOWN DISPOSABLE) ×1 IMPLANT
GOWN STRL REUS W/ TWL XL LVL3 (GOWN DISPOSABLE) IMPLANT
GOWN STRL REUS W/TWL XL LVL3 (GOWN DISPOSABLE) ×6
NDL SAFETY ECLIPSE 18X1.5 (NEEDLE) IMPLANT
NEEDLE HYPO 18GX1.5 SHARP (NEEDLE)
NEEDLE HYPO 22GX1.5 SAFETY (NEEDLE) IMPLANT
NS IRRIG 500ML POUR BTL (IV SOLUTION) IMPLANT
PACK CYSTOSCOPY (CUSTOM PROCEDURE TRAY) ×3 IMPLANT
SYR 20CC LL (SYRINGE) IMPLANT
SYR BULB IRRIGATION 50ML (SYRINGE) ×2 IMPLANT
WATER STERILE IRR 3000ML UROMA (IV SOLUTION) ×3 IMPLANT

## 2013-07-30 NOTE — Discharge Instructions (Signed)

## 2013-07-30 NOTE — Transfer of Care (Signed)
Immediate Anesthesia Transfer of Care Note  Patient: Mario Burns  Procedure(s) Performed: Procedure(s) (LRB): CYSTOSCOPY WITH BALLOON BILATION  (N/A)  Patient Location: PACU  Anesthesia Type: General  Level of Consciousness: awake, alert  and oriented  Airway & Oxygen Therapy: Patient Spontanous Breathing and Patient connected to nasal cannula oxygen  Post-op Assessment: Report given to PACU RN and Post -op Vital signs reviewed and stable  Post vital signs: Reviewed and stable  Complications: No apparent anesthesia complications

## 2013-07-30 NOTE — Op Note (Signed)
Preoperative diagnosis: Bladder neck contracture status post prostatectomy Postoperative diagnosis: Same  Procedure: Cystoscopy, balloon dilation of bladder neck contracture, removal of the vascular staple   Surgeon: Bernestine Amass M.D.  Anesthesia: Gen.  Indications: Mario Burns is approximately 18 months status post a robotic prostatectomy. He did have a postoperative urine leak and developed a bladder neck contracture. Pathology was favorable and his PSA has continued to be undetectable. He did undergo a dilation of the bladder neck contracture approximately year ago. At that time a small staple from the stapling of the dorsal vein complex had eroded into the bladder neck region was removed. The patient developed some urinary retention several months ago after incisional hernia repair. He has had some decrease in the force of his urinary stream. He presents now for endoscopic reassessment in repeat dilation if necessary. Full informed consent obtained.     Technique and findings: Patient was brought the operating room where he had successful induction of general anesthesia. He was placed in lithotomy position and prepped and draped in usual manner. Initial cystoscopy revealed a recurrent bladder neck contracture although it was not terribly tight. It would not accept the 24 French cystoscope but appeared to be 12-14 [place] in size. A guidewire was placed with direct vision into the bladder. Over the guidewire I placed a fascial dilating balloon that was 24 [place]. This was inflated to approximately 14 atmospheres of pressure for 3 minutes. At that point the bladder neck and open up nicely and easily allowed introduction of the cystoscope. At that point we realized there was a additional expose staple on the left side of the bladder neck anteriorly. This was removed with a grasper. No other staples were noted. The bladder was drained. I did not fillet indwelling Foley catheter was required. Patient was  brought to recovery room stable condition.

## 2013-07-30 NOTE — Interval H&P Note (Signed)
History and Physical Interval Note:  07/30/2013 9:36 AM  Mario Burns  has presented today for surgery, with the diagnosis of BLADDER NECK CONTRACTURE   The various methods of treatment have been discussed with the patient and family. After consideration of risks, benefits and other options for treatment, the patient has consented to  Procedure(s): [place]  (N/A) as a surgical intervention .  The patient's history has been reviewed, patient examined, no change in status, stable for surgery.  I have reviewed the patient's chart and labs.  Questions were answered to the patient's satisfaction.     Gale Klar,Bevin S

## 2013-07-30 NOTE — Anesthesia Preprocedure Evaluation (Addendum)
Anesthesia Evaluation  Patient identified by MRN, date of birth, ID band Patient awake    Reviewed: Allergy & Precautions, H&P , NPO status , Patient's Chart, lab work & pertinent test results  History of Anesthesia Complications (+) history of anesthetic complications  Airway Mallampati: II TM Distance: >3 FB Neck ROM: Full    Dental no notable dental hx.    Pulmonary shortness of breath and with exertion, sleep apnea ,  breath sounds clear to auscultation  Pulmonary exam normal       Cardiovascular Exercise Tolerance: Good hypertension, Pt. on medications Rhythm:Regular Rate:Normal     Neuro/Psych Anxiety Myasthenia Gravis diagnosed about 3 years ago. Needed medicines for about 6 months, and no trouble since.  Neuromuscular disease    GI/Hepatic Neg liver ROS, hiatal hernia, GERD-  Medicated and Controlled,  Endo/Other  diabetes, Type 2, Oral Hypoglycemic AgentsHypothyroidism   Renal/GU negative Renal ROS  negative genitourinary   Musculoskeletal negative musculoskeletal ROS (+)   Abdominal   Peds negative pediatric ROS (+)  Hematology negative hematology ROS (+)   Anesthesia Other Findings   Reproductive/Obstetrics negative OB ROS                         Anesthesia Physical Anesthesia Plan  ASA: III  Anesthesia Plan: General   Post-op Pain Management:    Induction: Intravenous  Airway Management Planned: LMA  Additional Equipment:   Intra-op Plan:   Post-operative Plan: Extubation in OR  Informed Consent: I have reviewed the patients History and Physical, chart, labs and discussed the procedure including the risks, benefits and alternatives for the proposed anesthesia with the patient or authorized representative who has indicated his/her understanding and acceptance.   Dental advisory given  Plan Discussed with: CRNA  Anesthesia Plan Comments:         Anesthesia  Quick Evaluation

## 2013-07-31 ENCOUNTER — Encounter (HOSPITAL_BASED_OUTPATIENT_CLINIC_OR_DEPARTMENT_OTHER): Payer: Self-pay | Admitting: Urology

## 2013-07-31 NOTE — Anesthesia Postprocedure Evaluation (Signed)
  Anesthesia Post-op Note  Patient: Mario Burns  Procedure(s) Performed: Procedure(s) (LRB): CYSTOSCOPY WITH BALLOON BILATION  (N/A)  Patient Location: PACU  Anesthesia Type: General  Level of Consciousness: awake and alert   Airway and Oxygen Therapy: Patient Spontanous Breathing  Post-op Pain: mild  Post-op Assessment: Post-op Vital signs reviewed, Patient's Cardiovascular Status Stable, Respiratory Function Stable, Patent Airway and No signs of Nausea or vomiting  Last Vitals:  Filed Vitals:   07/30/13 1251  BP: 124/74  Pulse: 56  Temp: 36.1 C  Resp: 16    Post-op Vital Signs: stable   Complications: No apparent anesthesia complications

## 2013-12-11 ENCOUNTER — Ambulatory Visit: Payer: Commercial Managed Care - HMO | Admitting: Endocrinology

## 2014-05-31 LAB — HEMOGLOBIN A1C: Hgb A1c MFr Bld: 10.3 % — AB (ref 4.0–6.0)

## 2014-06-26 ENCOUNTER — Ambulatory Visit (INDEPENDENT_AMBULATORY_CARE_PROVIDER_SITE_OTHER): Payer: PPO | Admitting: Endocrinology

## 2014-06-26 ENCOUNTER — Telehealth: Payer: Self-pay | Admitting: Endocrinology

## 2014-06-26 ENCOUNTER — Encounter: Payer: Self-pay | Admitting: Endocrinology

## 2014-06-26 VITALS — BP 128/84 | HR 61 | Temp 97.9°F | Resp 16 | Ht 69.0 in | Wt 182.8 lb

## 2014-06-26 DIAGNOSIS — E1165 Type 2 diabetes mellitus with hyperglycemia: Secondary | ICD-10-CM | POA: Diagnosis not present

## 2014-06-26 DIAGNOSIS — I10 Essential (primary) hypertension: Secondary | ICD-10-CM

## 2014-06-26 DIAGNOSIS — E785 Hyperlipidemia, unspecified: Secondary | ICD-10-CM

## 2014-06-26 DIAGNOSIS — IMO0002 Reserved for concepts with insufficient information to code with codable children: Secondary | ICD-10-CM

## 2014-06-26 HISTORY — DX: Hyperlipidemia, unspecified: E78.5

## 2014-06-26 HISTORY — DX: Essential (primary) hypertension: I10

## 2014-06-26 NOTE — Progress Notes (Signed)
Patient ID: Mario Burns, male   DOB: Jul 31, 1945, 69 y.o.   MRN: [id number]           Reason for Appointment: Consultation for Type 2 Diabetes  Referring physician: Hamrick  History of Present Illness:          Date of diagnosis of type 2 diabetes mellitus : ?  2006        Background history:  He thinks his blood sugars were mildly increased for a few years before his diagnosis was made and he was told at his diabetes was borderline. At the time of diagnosis his blood sugar was about 400 and he was having symptoms of blurred vision and weakness Over the last few years he has been on various oral hypoglycemic agents including metformin, Actos, Amaryl, Januvia and Invokana However does not appear that his blood sugars have been controlled for at least a year He was also tried on Levemir insulin by itself but his blood sugars were up to 300 when he was taking as much as 50 units daily  Recent history:  Recently has been taking only Amaryl 4 mg twice a day His A1c has been 9.8 and 10.3 recently with the lab glucose of 204  Current blood sugar patterns and problems identified:     He thinks his blood sugars are usually not over 200; he is checking his blood sugars mostly in the morning  He does not always watch his diet consistently and has not had any formal diabetes education  Not able to exercise much because knee pain and fatigue  Oral hypoglycemic drugs the patient is taking are:    Amaryl 4 mg twice a day    Side effects from medications have been: Polyuria with Invokana  Compliance with the medical regimen: Fair Hypoglycemia:   never  Glucose monitoring:  done 1 times a day         Glucometer: One Touch or Accu-Chek     Blood Glucose readings by recall  PREMEAL Breakfast Lunch Dinner Bedtime  Overall   Glucose range: 180-210  140    Median:         Self-care: The diet that the patient has been following is: none   Meal times: Breakfast: 7-8 AM, Lunch: 12-1 PM, Dinner:   6 PM Typical meal intake: Breakfast is an egg sandwich or oatmeal or breakfast bars.  Lunch is a sandwich.  Evening meal is variable.  Has snacks with peanut butter crackers, usually avoiding sweets and ring switch sugar        Dietician visit, most recent: Never               Exercise: none   Weight history: 170-195  Wt Readings from Last 3 Encounters:  06/26/14 182 lb 12.8 oz (82.918 kg)  07/30/13 180 lb (81.647 kg)  07/26/13 184 lb (83.462 kg)    Glycemic control:   Lab Results  Component Value Date   HGBA1C 10.3* 05/31/2014   HGBA1C 9.2* 04/25/2013   Lab Results  Component Value Date   CREATININE 0.73 04/26/2013   Urine microalbumin/creatinine ratio 4.5 done in 08/2013      Medication List       This list is accurate as of: 06/26/14 12:19 PM.  Always use your most recent med list.               bisacodyl 5 MG EC tablet  Generic drug:  bisacodyl  Take 5 mg by mouth  daily as needed for moderate constipation.     docusate sodium 100 MG capsule  Commonly known as:  COLACE  Take 100 mg by mouth daily.     glimepiride 4 MG tablet  Commonly known as:  AMARYL  Take 4 mg by mouth 2 (two) times daily.     levothyroxine 125 MCG tablet  Commonly known as:  SYNTHROID, LEVOTHROID  Take 125 mcg by mouth every morning.     losartan 50 MG tablet  Commonly known as:  COZAAR  Take 50 mg by mouth daily.     naproxen sodium 220 MG tablet  Commonly known as:  ANAPROX  Take 440 mg by mouth every morning.     omeprazole 20 MG capsule  Commonly known as:  PRILOSEC  Take 20 mg by mouth every evening.     phenazopyridine 200 MG tablet  Commonly known as:  PYRIDIUM  Take 1 tablet (200 mg total) by mouth 3 (three) times daily as needed for pain.        Allergies:  Allergies  Allergen Reactions  . Sulfa Antibiotics Other (See Comments)    unknown    Past Medical History  Diagnosis Date  . Hypertension   . Shortness of breath     ALWAYS--HAD FOR YEARS - STATES  HE CAN'T DO HARDLY ANYTHING WITHOUT GIVING OUT  . GERD (gastroesophageal reflux disease)   . H/O hiatal hernia   . Arthritis   . OSA (obstructive sleep apnea)     NON-COMPLIANT CPAP--  CLAUSTROPHOBIC  . Type 2 diabetes mellitus   . Claustrophobia   . History of prostate cancer     S/P PROSTATECTOMY  . Bladder neck contracture   . Borderline hyperlipidemia   . MG (myasthenia gravis)     DX 2013 --  NEUROLOGIST--  DR WILLIS/  TREATED W/ MEDICATION UNTIL SYMPTOMS RESOLVED -- NO REMISSION--  SEES DR WILLIS PRN  . Weak urinary stream   . Hypothyroidism, postsurgical   . History of thyroid nodule     LARGE GOITER  . Complication of anesthesia     CLAUSTROPHOBIC    Past Surgical History  Procedure Laterality Date  . Hemorrhoid surgery  1990's  . Robot assisted laparoscopic radical prostatectomy  12/15/2011    Procedure: [place];  Surgeon: Bernestine Amass, MD;  Location: [facility];  Service: Urology;  Laterality: N/A;  . Total thyroidectomy  05-24-2003  . Cystoscopy with urethral dilatation N/A 08/02/2012    Procedure: CYSTOSCOPY WITH URETHRAL DILATATION, AN REMOVAL STAPLE FROM BLADDER NECK;  Surgeon: Bernestine Amass, MD;  Location: [facility];  Service: Urology;  Laterality: N/A;  . Incisional hernia repair N/A 04/23/2013    Procedure: LAPAROSCOPIC INCISIONAL HERNIA;  Surgeon: [name], MD;  Location: [facility];  Service: General;  Laterality: N/A;  . Insertion of mesh N/A 04/23/2013    Procedure: INSERTION OF MESH;  Surgeon: [name], MD;  Location: [facility];  Service: General;  Laterality: N/A;  . Cystoscopy N/A 07/30/2013    Procedure: CYSTOSCOPY WITH Anastasio Champion ;  Surgeon: Bernestine Amass, MD;  Location: [facility];  Service: Urology;  Laterality: N/A;    Family History  Problem Relation Age of Onset  . Diabetes Sister   . Diabetes Brother     Social History:  reports that he has never smoked.  He has never used smokeless tobacco. He reports that he does not drink alcohol or use  illicit drugs.    Review of Systems    Lipid history: He reportedly has had blood in stool from lovastatin and sore throat and headache with pravastatin and has not been offered another medication.  Last LDL 142   No results found for: CHOL, HDL, LDLCALC, LDLDIRECT, TRIG, CHOLHDL         Constitutional: Does tend to gain weight recently and also complains of feeling tired especially at the end of day  Eyes:  He does have a history of blurred vision.  Most recent eye exam was in 02/2013  ENT: no nasal congestion, difficulty swallowing   Cardiovascular: no chest pain or tightness on exertion.  No leg swelling.  Hypertension: On Losartan  Respiratory: no cough/shortness of breath  Gastrointestinal: no constipation, diarrhea, nausea or abdominal pain  Musculoskeletal: has knee joint pain especially on the left and has difficulty walking much.   Urological: Has frequency of urination;  Nocturia 2-6x  Skin: no rash or infections  Neurological: no headaches.  He has a funny feeling/swelling sensation in his head  Has no numbness in his feet but occasionally may have some burning or tingling  Psychiatric: no symptoms of depression  Endocrine: No unusual fatigue, cold intolerance.    Has history of a multinodular goiter treated surgically about 14 years ago He has been on thyroid supplement regularly, currently on 125 g of levothyroxine and his last TSH in 5/16 was 2.5    Physical Examination:  BP 128/84 mmHg  Pulse 61  Temp(Src) 97.9 F (36.6 C)  Resp 16  Ht 5\' 9"  (1.753 m)  Wt 182 lb 12.8 oz (82.918 kg)  BMI 26.98 kg/m2  SpO2 96%  GENERAL:         Patient has abdominal obesity.   HEENT:         Eye exam shows normal external appearance. Fundus exam shows no retinopathy. Oral exam shows normal mucosa .  NECK:   There is no lymphadenopathy Thyroid is not enlarged and no nodules felt.    Carotids are normal to palpation and no bruit heard LUNGS:         Chest is symmetrical. Lungs are clear to auscultation.Marland Kitchen   HEART:         Heart sounds:  S1 and S2 are normal. No murmurs or clicks heard., no S3 or S4.   ABDOMEN:   There is no distention present. Liver and spleen are not palpable. No other mass or tenderness present.   NEUROLOGICAL:   Vibration sense is absent on the left distal great toe and markedly reduced in distal first left toe. Ankle jerks are absent bilaterally.          Monofilament sensation appears to be normal except slightly decreased on the right plantar surface.  Pedal pulses are normal MUSCULOSKELETAL:  There is no swelling or deformity of the peripheral joints. Spine is normal to inspection.   EXTREMITIES:     There is no edema. No skin lesions present.Marland Kitchen SKIN:       No rash or lesions of concern.        ASSESSMENT:  Diabetes type 2, uncontrolled    He has had persistently poor control of his diabetes and has been mostly on glimepiride as his only treatment Has been intolerant to Invokana because of polyuria Not clear if he had any side effects from metformin and Actos He is modestly obese but appears to have some insulin resistance because of his abdominal obesity and history  of insensitivity to taking basal insulin  Since his A1c is over 10% he will need a medication that would be fairly effective.  Currently not clear if he is insulin deficient and will give him a trial of a GLP-1 drug first especially since he reportedly did not benefit from taking basal insulin.   Discussed with the patient the nature of GLP-1 drugs, the action on various organ systems, how they benefit blood glucose control, as well as the benefit of weight loss and  increase satiety . Explained possible side effects especially nausea and vomiting; discussed safety information in package insert. Demonstrated the medication injection device and injection technique to the patient. Discussed  injection sites and titration of Trulicity starting with 0.75 mg once a week for 2 weeks and then increasing to 1.5 mg if no symptoms of nausea. Patient brochure on Trulicity given  Complications: Minimal peripheral neuropathy  HYPERCHOLESTEROLEMIA, currently untreated, reportedly intolerant to a couple of statin drugs but probably has not tried Lipitor, Crestor, Livalo and Zocor  HYPERTENSION: Blood pressure is slightly high, currently followed by PCP and is only on 50 mg losartan  PLAN:   He will check on the coverage for the preferred GLP-1 drug on his plan and call us back.  He was given the options of taking either Trulicity or Victoza.  Have shown him how to use both the injection devices as well as dosage titration  Consultation with dietitian  He will bring his monitor for download on each visit  He was advised to check his blood sugars more often about 2 hours after meals, discussed blood sugar targets  Continue Amaryl  Consider adding metformin  Encouraged him to start being more active, may try to walk in the mornings  Consider Lipitor on his next visit for hypercholesterolemia, discussed implications of hyperlipidemia with diabetes  Continue follow-up with PCP for hypertension  Needs follow-up eye exam  Check urine microalbumin when blood sugar better controlled  Patient Instructions  Start VICTOZA injection as shown once daily at the same time of the day.  Dial the dose to 0.6 mg on the pen for the first week.   You may inject in the stomach, thigh or arm. You may experience nausea in the first few days which usually goes away.   You will feel fullness of the stomach with starting the medication and should try to keep the portions at meals small. After 1 week increase the dose to 1.2mg  daily if no nausea present.   If any questions or concerns are present call the office or the [place] helpline at [phone]. Visit [email or web address].wall.info/  for more useful information  Check on Trulicity weekly  Check blood sugars on waking up .Marland Kitchen 3-4 .Marland Kitchen times a week Also check blood sugars about 2 hours after a meal and do this after different meals by rotation Recommended blood sugar levels on waking up is 90-130 and about 2 hours after meal is 140-180 Please bring blood sugar monitor to each visit.   Counseling time on subjects discussed above is over 50% of today's 60 minute visit   Dilara Navarrete 06/26/2014, 12:19 PM   Note: This office note was prepared with Estate agent. Any transcriptional errors that result from this process are unintentional.

## 2014-06-26 NOTE — Patient Instructions (Addendum)
Start VICTOZA injection as shown once daily at the same time of the day.  Dial the dose to 0.6 mg on the pen for the first week.   You may inject in the stomach, thigh or arm. You may experience nausea in the first few days which usually goes away.   You will feel fullness of the stomach with starting the medication and should try to keep the portions at meals small. After 1 week increase the dose to 1.2mg  daily if no nausea present.   If any questions or concerns are present call the office or the [place] helpline at [phone]. Visit [email or web address].wall.info/ for more useful information  Check on Trulicity weekly  Check blood sugars on waking up .Marland Kitchen 3-4 .Marland Kitchen times a week Also check blood sugars about 2 hours after a meal and do this after different meals by rotation Recommended blood sugar levels on waking up is 90-130 and about 2 hours after meal is 140-180 Please bring blood sugar monitor to each visit.

## 2014-06-26 NOTE — Telephone Encounter (Signed)
Please call in the victoza pens and the lancets to the [facility] in [place]

## 2014-06-27 ENCOUNTER — Other Ambulatory Visit: Payer: Self-pay

## 2014-06-27 MED ORDER — LIRAGLUTIDE 18 MG/3ML ~~LOC~~ SOPN: PEN_INJECTOR | SUBCUTANEOUS | Status: DC

## 2014-07-10 ENCOUNTER — Encounter: Payer: PPO | Attending: Endocrinology | Admitting: Nutrition

## 2014-07-10 ENCOUNTER — Other Ambulatory Visit: Payer: Self-pay | Admitting: *Deleted

## 2014-07-10 DIAGNOSIS — Z713 Dietary counseling and surveillance: Secondary | ICD-10-CM | POA: Diagnosis not present

## 2014-07-10 DIAGNOSIS — E118 Type 2 diabetes mellitus with unspecified complications: Secondary | ICD-10-CM | POA: Diagnosis not present

## 2014-07-10 MED ORDER — EXENATIDE ER 2 MG ~~LOC~~ PEN: PEN_INJECTOR | SUBCUTANEOUS | Status: DC

## 2014-07-10 NOTE — Patient Instructions (Signed)
Test blood sugars before breakfast, and 2 hours after one meal each day--varying the meal each day. Take Bydureon once a week.

## 2014-07-10 NOTE — Progress Notes (Signed)
Patient is here with his wife to review blood sugars and diet.  Meals are balance, but higher in fat.  Snacks on 4 scoops of ice cream at HS.  Says this does not run his blood sugars up in the AM  He took the Victoza for 4 days, but had constipation and severe head ache and reflux.  So he stopped this.    He is testing his blood sugars only Fasting.  While on the Victoza, the FBSs were in the 120s to 130, but since stopping it, they have creeped up to the 180s.    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 Dwyane Dee, he was instructed on how to mix and take the Bydureon injection.  We discussed how it worked to control his blood sugars and how / when to take this.  He was given a brochure on the Bydureon, how it worked and how to mix/inject the medication.  He was encouraged to test his blood sugars at least twice a day--2 hours after one meal each day, as well as his FBS.  He agreed to do this.  They had no final questions. Marland Kitchen

## 2014-07-17 ENCOUNTER — Ambulatory Visit: Payer: PPO | Admitting: Endocrinology

## 2014-08-07 ENCOUNTER — Ambulatory Visit (INDEPENDENT_AMBULATORY_CARE_PROVIDER_SITE_OTHER): Payer: PPO | Admitting: Endocrinology

## 2014-08-07 ENCOUNTER — Encounter: Payer: Self-pay | Admitting: Endocrinology

## 2014-08-07 VITALS — BP 120/74 | HR 67 | Temp 98.0°F | Resp 14 | Ht 69.0 in | Wt 181.0 lb

## 2014-08-07 DIAGNOSIS — E1165 Type 2 diabetes mellitus with hyperglycemia: Secondary | ICD-10-CM

## 2014-08-07 DIAGNOSIS — IMO0002 Reserved for concepts with insufficient information to code with codable children: Secondary | ICD-10-CM

## 2014-08-07 MED ORDER — METFORMIN HCL ER 500 MG PO TB24: 1500.0000 mg | ORAL_TABLET | Freq: Every day | ORAL | Status: DC

## 2014-08-07 NOTE — Progress Notes (Signed)
Patient ID: Mario Burns, male   DOB: 06/08/1945, 69 y.o.   MRN: [id number]           Reason for Appointment: follow-up for Type 2 Diabetes  Referring physician: Hamrick  History of Present Illness:          Date of diagnosis of type 2 diabetes mellitus: ?  2006        Background history:  Mario Burns thinks his blood sugars were mildly increased for a few years before his diagnosis was made and Mario Burns was told at his diabetes was borderline. At the time of diagnosis his blood sugar was about 400 and Mario Burns was having symptoms of blurred vision and weakness Over the last few years Mario Burns has been on various oral hypoglycemic agents including metformin, Actos, Amaryl, Januvia and Invokana However does not appear that his blood sugars have been controlled for at least a year Mario Burns was also tried on Levemir insulin by itself but his blood sugars were up to 300 when Mario Burns was taking as much as 50 units daily  Recent history:  On his initial consultation Mario Burns had been taking only Amaryl 4 mg twice a day His A1c had been 10.3 recently with the lab glucose of 204 Mario Burns was intolerant to Victoza and was tried on Bydureon but after the third injection Mario Burns started having constipation and marked nausea Mario Burns has not taken this last week  Current blood sugar patterns and problems identified:  Mario Burns is checking his blood sugars primarily in the mornings only despite instructions to check some postprandial readings  Mario Burns started having some good readings in the mornings about 1-2 weeks ago but they are still relatively high now in the morning  Mario Burns is trying to take his Amaryl at bedtime in the evening and Mario Burns thinks this helps  Mario Burns thinks most of his high readings are when Mario Burns is eating out in the evenings  Mario Burns was told by the nurse educator to cut back on high fat foods and snacking on ice cream at night  Not able to exercise much because knee pain and fatigueand has not been motivated to do any  Oral hypoglycemic drugs the patient is  taking are:    Amaryl 4 mg twice a day    Side effects from medications have been: Polyuria with Invokana  Compliance with the medical regimen: Fair Hypoglycemia:   never  Glucose monitoring:  done 1 times a day         Glucometer: One Touch or Accu-Chek     Blood Glucose readings by download  Mean values apply above for all meters except median for One Touch  PRE-MEAL Fasting Lunch Dinner Bedtime Overall  Glucose range: 123-209  123, 132    Mean/median: 167        Self-care: The diet that the patient has been following is: smaller portions  Meal times: Breakfast: 7-8 AM, Lunch: 12-1 PM, Dinner:  6 PM Typical meal intake: Breakfast is an egg sandwich or oatmeal or breakfast bars.  Lunch is a sandwich.  Evening meal is variable.  Has snacks with peanut butter crackers, usually avoiding sweets and drinks with sugar     Dietician visit, most recent: Never CDE visit:7/16               Exercise: none   Weight history: 170-195  Wt Readings from Last 3 Encounters:  08/07/14 181 lb (82.101 kg)  06/26/14 182 lb 12.8 oz (82.918 kg)  07/30/13 180 lb (  81.647 kg)    Glycemic control:   Lab Results  Component Value Date   HGBA1C 10.3* 05/31/2014   HGBA1C 9.2* 04/25/2013   Lab Results  Component Value Date   CREATININE 0.73 04/26/2013   Urine microalbumin/creatinine ratio 4.5 done in 08/2013      Medication List       This list is accurate as of: 08/07/14  9:45 AM.  Always use your most recent med list.               bisacodyl 5 MG EC tablet  Generic drug:  bisacodyl  Take 5 mg by mouth daily as needed for moderate constipation.     docusate sodium 100 MG capsule  Commonly known as:  COLACE  Take 100 mg by mouth daily.     glimepiride 4 MG tablet  Commonly known as:  AMARYL  Take 4 mg by mouth 2 (two) times daily.     levothyroxine 125 MCG tablet  Commonly known as:  SYNTHROID, LEVOTHROID  Take 125 mcg by mouth every morning.     losartan 50 MG tablet  Commonly  known as:  COZAAR  Take 50 mg by mouth daily.     metFORMIN 500 MG 24 hr tablet  Commonly known as:  GLUCOPHAGE-XR  Take 3 tablets (1,500 mg total) by mouth daily with supper.     naproxen sodium 220 MG tablet  Commonly known as:  ANAPROX  Take 440 mg by mouth every morning.     omeprazole 20 MG capsule  Commonly known as:  PRILOSEC  Take 20 mg by mouth every evening.        Allergies:  Allergies  Allergen Reactions  . Sulfa Antibiotics Other (See Comments)    unknown    Past Medical History  Diagnosis Date  . Hypertension   . Shortness of breath     ALWAYS--HAD FOR YEARS - STATES Mario Burns CAN'T DO HARDLY ANYTHING WITHOUT GIVING OUT  . GERD (gastroesophageal reflux disease)   . H/O hiatal hernia   . Arthritis   . OSA (obstructive sleep apnea)     NON-COMPLIANT CPAP--  CLAUSTROPHOBIC  . Type 2 diabetes mellitus   . Claustrophobia   . History of prostate cancer     S/P PROSTATECTOMY  . Bladder neck contracture   . Borderline hyperlipidemia   . MG (myasthenia gravis)     DX 2013 --  NEUROLOGIST--  DR WILLIS/  TREATED W/ MEDICATION UNTIL SYMPTOMS RESOLVED -- NO REMISSION--  SEES DR WILLIS PRN  . Weak urinary stream   . Hypothyroidism, postsurgical   . History of thyroid nodule     LARGE GOITER  . Complication of anesthesia     CLAUSTROPHOBIC    Past Surgical History  Procedure Laterality Date  . Hemorrhoid surgery  1990's  . Robot assisted laparoscopic radical prostatectomy  12/15/2011    Procedure: [address];  Surgeon: Bernestine Amass, MD;  Location: [facility];  Service: Urology;  Laterality: N/A;  . Total thyroidectomy  05-24-2003  . Cystoscopy with urethral dilatation N/A 08/02/2012    Procedure: CYSTOSCOPY WITH URETHRAL DILATATION, AN REMOVAL STAPLE FROM BLADDER NECK;  Surgeon: Bernestine Amass, MD;  Location: [facility];  Service: Urology;  Laterality: N/A;  . Incisional hernia repair N/A 04/23/2013    Procedure:  LAPAROSCOPIC INCISIONAL HERNIA;  Surgeon: [name], MD;  Location: [facility];  Service: General;  Laterality: N/A;  . Insertion of mesh N/A  04/23/2013    Procedure: INSERTION OF MESH;  Surgeon: [name], MD;  Location: [facility];  Service: General;  Laterality: N/A;  . Cystoscopy N/A 07/30/2013    Procedure: CYSTOSCOPY WITH Anastasio Champion ;  Surgeon: Bernestine Amass, MD;  Location: [facility];  Service: Urology;  Laterality: N/A;    Family History  Problem Relation Age of Onset  . Diabetes Sister   . Diabetes Brother     Social History:  reports that Mario Burns has never smoked. Mario Burns has never used smokeless tobacco. Mario Burns reports that Mario Burns does not drink alcohol or use illicit drugs.    Review of Systems    Lipid history: Mario Burns reportedly has had blood in stool from lovastatin and sore throat and headache with pravastatin and has not been offered another medication.  Last LDL 142   No results found for: CHOL, HDL, LDLCALC, LDLDIRECT, TRIG, CHOLHDL         Eyes: Most recent eye exam was in 02/2013   Hypertension: On Losartan  Neurological:  Has no numbness in his feet but occasionally may have some burning or tingling   Has history of a multinodular goiter treated surgically about 14 years ago Mario Burns has been on thyroid supplement regularly, currently on 125 g of levothyroxine and his last TSH in 5/16 was 2.5  Last diabetic foot exam in 6/16 showed decreased sensation right plantar surface  Physical Examination:  BP 120/74 mmHg  Pulse 67  Temp(Src) 98 F (36.7 C)  Resp 14  Ht 5\' 9"  (1.753 m)  Wt 181 lb (82.101 kg)  BMI 26.72 kg/m2  SpO2 97%       ASSESSMENT:  Diabetes type 2, uncontrolled    Mario Burns has had persistently poor control of his diabetes and has been mostly on glimepiride as his only treatment Has been intolerant to several medications and also appears to be intolerant to GLP-1 drugs like Bydureon and Victoza Also his diet and exercise regimen is  inadequate Although Mario Burns had previously taken metformin not clear if Mario Burns had any side effects with this, does not think Mario Burns had diarrhea with it  HYPERCHOLESTEROLEMIA, currently untreated, reportedly intolerant to a couple of statin drugs but likely has not tried Lipitor, Crestor, Livalo and Zocor Mario Burns is reluctant start any medications without talking to his PCP Discussed importance of lipid control with his risk factors of diabetes and hypertension   PLAN:   Mario Burns will be given a trial of metformin ER in gradually increasing doses at dinnertime with food  Consider reducing Amaryl especially in the morning if blood sugars start getting relatively low.  Meanwhile May take evening dose at bedtime  Start monitoring some readings after meals more regularly and not every day in the morning, discussed blood sugar targets  Start walking at least 3 days a week  Review blood sugars in another month and recheck A1c on next visit  Reduce high-fat foods especially when eating out and large amounts of ice cream  Mario Burns should be taking aspirin 81 mg, will defer to PCP  Patient Instructions  Start taking Metformin 500 mg, 1 tablet with your main meal for 5 days.  Occasionally this may initially cause loose stools or nausea. If  tolerating well after 5 days add a second Metformin tablet (500 mg) at the same time.  Continue adding another tablet after 5 days days if no persistent nausea or diarrhea until reaching the maximum tolerated dose or the full dose of 3 tablets  once daily.  Stay on Glimeperide am and bedtime  Walk 3-4 days a week  Check blood sugars on waking up .. 3 .. times a week Also check blood sugars about 2 hours after a meal and do this after different meals by rotation  Recommended blood sugar levels on waking up is 90-130 and about 2 hours after meal is 140-180 Please bring blood sugar monitor to each visit.   Counseling time on subjects discussed above is over 50% of today's 25 minute  visit    Darrion Macaulay 08/07/2014, 9:45 AM   Note: This office note was prepared with Estate agent. Any transcriptional errors that result from this process are unintentional.

## 2014-08-07 NOTE — Patient Instructions (Addendum)
Start taking Metformin 500 mg, 1 tablet with your main meal for 5 days.  Occasionally this may initially cause loose stools or nausea. If  tolerating well after 5 days add a second Metformin tablet (500 mg) at the same time.  Continue adding another tablet after 5 days days if no persistent nausea or diarrhea until reaching the maximum tolerated dose or the full dose of 3 tablets once daily.  Stay on Glimeperide am and bedtime  Walk 3-4 days a week  Check blood sugars on waking up .. 3 .. times a week Also check blood sugars about 2 hours after a meal and do this after different meals by rotation  Recommended blood sugar levels on waking up is 90-130 and about 2 hours after meal is 140-180 Please bring blood sugar monitor to each visit.

## 2014-09-05 ENCOUNTER — Ambulatory Visit: Payer: PPO | Admitting: Endocrinology

## 2015-01-09 DIAGNOSIS — E1165 Type 2 diabetes mellitus with hyperglycemia: Secondary | ICD-10-CM | POA: Diagnosis not present

## 2015-01-09 DIAGNOSIS — E890 Postprocedural hypothyroidism: Secondary | ICD-10-CM | POA: Diagnosis not present

## 2015-01-09 DIAGNOSIS — Z6828 Body mass index (BMI) 28.0-28.9, adult: Secondary | ICD-10-CM | POA: Diagnosis not present

## 2015-01-09 DIAGNOSIS — E7800 Pure hypercholesterolemia, unspecified: Secondary | ICD-10-CM | POA: Diagnosis not present

## 2015-01-09 DIAGNOSIS — E538 Deficiency of other specified B group vitamins: Secondary | ICD-10-CM | POA: Diagnosis not present

## 2015-01-09 DIAGNOSIS — C61 Malignant neoplasm of prostate: Secondary | ICD-10-CM | POA: Diagnosis not present

## 2015-01-09 DIAGNOSIS — I10 Essential (primary) hypertension: Secondary | ICD-10-CM | POA: Diagnosis not present

## 2015-02-05 DIAGNOSIS — H524 Presbyopia: Secondary | ICD-10-CM | POA: Diagnosis not present

## 2015-02-05 DIAGNOSIS — E119 Type 2 diabetes mellitus without complications: Secondary | ICD-10-CM | POA: Diagnosis not present

## 2015-02-05 DIAGNOSIS — H2513 Age-related nuclear cataract, bilateral: Secondary | ICD-10-CM | POA: Diagnosis not present

## 2015-05-14 DIAGNOSIS — E1165 Type 2 diabetes mellitus with hyperglycemia: Secondary | ICD-10-CM | POA: Diagnosis not present

## 2015-05-14 DIAGNOSIS — Z6828 Body mass index (BMI) 28.0-28.9, adult: Secondary | ICD-10-CM | POA: Diagnosis not present

## 2015-05-14 DIAGNOSIS — T466X5A Adverse effect of antihyperlipidemic and antiarteriosclerotic drugs, initial encounter: Secondary | ICD-10-CM | POA: Diagnosis not present

## 2015-05-14 DIAGNOSIS — I10 Essential (primary) hypertension: Secondary | ICD-10-CM | POA: Diagnosis not present

## 2015-05-14 DIAGNOSIS — E7800 Pure hypercholesterolemia, unspecified: Secondary | ICD-10-CM | POA: Diagnosis not present

## 2015-05-14 DIAGNOSIS — E890 Postprocedural hypothyroidism: Secondary | ICD-10-CM | POA: Diagnosis not present

## 2015-05-23 DIAGNOSIS — N393 Stress incontinence (female) (male): Secondary | ICD-10-CM | POA: Diagnosis not present

## 2015-05-23 DIAGNOSIS — Z0000 Encounter for general adult medical examination without abnormal findings: Secondary | ICD-10-CM | POA: Diagnosis not present

## 2015-05-23 DIAGNOSIS — Z8546 Personal history of malignant neoplasm of prostate: Secondary | ICD-10-CM | POA: Diagnosis not present

## 2015-08-12 DIAGNOSIS — E890 Postprocedural hypothyroidism: Secondary | ICD-10-CM | POA: Diagnosis not present

## 2015-08-12 DIAGNOSIS — E7800 Pure hypercholesterolemia, unspecified: Secondary | ICD-10-CM | POA: Diagnosis not present

## 2015-08-12 DIAGNOSIS — E1165 Type 2 diabetes mellitus with hyperglycemia: Secondary | ICD-10-CM | POA: Diagnosis not present

## 2015-08-20 DIAGNOSIS — E7800 Pure hypercholesterolemia, unspecified: Secondary | ICD-10-CM | POA: Diagnosis not present

## 2015-08-20 DIAGNOSIS — I10 Essential (primary) hypertension: Secondary | ICD-10-CM | POA: Diagnosis not present

## 2015-08-20 DIAGNOSIS — E1165 Type 2 diabetes mellitus with hyperglycemia: Secondary | ICD-10-CM | POA: Diagnosis not present

## 2015-08-20 DIAGNOSIS — Z6828 Body mass index (BMI) 28.0-28.9, adult: Secondary | ICD-10-CM | POA: Diagnosis not present

## 2015-08-20 DIAGNOSIS — E890 Postprocedural hypothyroidism: Secondary | ICD-10-CM | POA: Diagnosis not present

## 2015-08-20 DIAGNOSIS — E663 Overweight: Secondary | ICD-10-CM | POA: Diagnosis not present

## 2015-08-20 DIAGNOSIS — Z9181 History of falling: Secondary | ICD-10-CM | POA: Diagnosis not present

## 2015-09-02 ENCOUNTER — Other Ambulatory Visit: Payer: Self-pay | Admitting: Gastroenterology

## 2015-09-02 DIAGNOSIS — K635 Polyp of colon: Secondary | ICD-10-CM | POA: Diagnosis not present

## 2015-09-02 DIAGNOSIS — K5730 Diverticulosis of large intestine without perforation or abscess without bleeding: Secondary | ICD-10-CM | POA: Diagnosis not present

## 2015-09-02 DIAGNOSIS — K648 Other hemorrhoids: Secondary | ICD-10-CM | POA: Diagnosis not present

## 2015-09-02 DIAGNOSIS — D123 Benign neoplasm of transverse colon: Secondary | ICD-10-CM | POA: Diagnosis not present

## 2015-09-02 DIAGNOSIS — Z86010 Personal history of colonic polyps: Secondary | ICD-10-CM | POA: Diagnosis not present

## 2015-09-29 DIAGNOSIS — S0300XA Dislocation of jaw, unspecified side, initial encounter: Secondary | ICD-10-CM | POA: Diagnosis not present

## 2015-09-29 DIAGNOSIS — Z6827 Body mass index (BMI) 27.0-27.9, adult: Secondary | ICD-10-CM | POA: Diagnosis not present

## 2015-10-01 DIAGNOSIS — M2663 Articular disc disorder of temporomandibular joint: Secondary | ICD-10-CM | POA: Diagnosis not present

## 2015-10-02 ENCOUNTER — Other Ambulatory Visit: Payer: Self-pay | Admitting: Oral Surgery

## 2015-10-02 DIAGNOSIS — S0300XA Dislocation of jaw, unspecified side, initial encounter: Secondary | ICD-10-CM

## 2015-10-07 ENCOUNTER — Ambulatory Visit
Admission: RE | Admit: 2015-10-07 | Discharge: 2015-10-07 | Disposition: A | Discharge status: Home / Self Care | Payer: PPO | Source: Ambulatory Visit | Attending: Oral Surgery | Admitting: Oral Surgery

## 2015-10-07 DIAGNOSIS — S0300XA Dislocation of jaw, unspecified side, initial encounter: Secondary | ICD-10-CM

## 2015-10-14 ENCOUNTER — Other Ambulatory Visit: Payer: Self-pay | Admitting: Oral Surgery

## 2015-10-14 ENCOUNTER — Other Ambulatory Visit: Payer: PPO

## 2015-10-14 DIAGNOSIS — G8929 Other chronic pain: Secondary | ICD-10-CM

## 2015-10-14 DIAGNOSIS — M26629 Arthralgia of temporomandibular joint, unspecified side: Principal | ICD-10-CM

## 2015-10-21 DIAGNOSIS — Z6828 Body mass index (BMI) 28.0-28.9, adult: Secondary | ICD-10-CM | POA: Diagnosis not present

## 2015-10-21 DIAGNOSIS — M25512 Pain in left shoulder: Secondary | ICD-10-CM | POA: Diagnosis not present

## 2015-10-23 ENCOUNTER — Ambulatory Visit
Admission: RE | Admit: 2015-10-23 | Discharge: 2015-10-23 | Disposition: A | Discharge status: Home / Self Care | Payer: PPO | Source: Ambulatory Visit | Attending: Oral Surgery | Admitting: Oral Surgery

## 2015-10-23 DIAGNOSIS — M26631 Articular disc disorder of right temporomandibular joint: Secondary | ICD-10-CM | POA: Diagnosis not present

## 2015-10-23 DIAGNOSIS — S40012A Contusion of left shoulder, initial encounter: Secondary | ICD-10-CM | POA: Diagnosis not present

## 2015-10-23 DIAGNOSIS — M26629 Arthralgia of temporomandibular joint, unspecified side: Principal | ICD-10-CM

## 2015-10-23 DIAGNOSIS — G8929 Other chronic pain: Secondary | ICD-10-CM

## 2015-11-13 DIAGNOSIS — S40012A Contusion of left shoulder, initial encounter: Secondary | ICD-10-CM | POA: Diagnosis not present

## 2015-12-01 DIAGNOSIS — N320 Bladder-neck obstruction: Secondary | ICD-10-CM | POA: Diagnosis not present

## 2015-12-01 DIAGNOSIS — N393 Stress incontinence (female) (male): Secondary | ICD-10-CM | POA: Diagnosis not present

## 2015-12-01 DIAGNOSIS — Z8546 Personal history of malignant neoplasm of prostate: Secondary | ICD-10-CM | POA: Diagnosis not present

## 2016-02-18 DIAGNOSIS — H524 Presbyopia: Secondary | ICD-10-CM | POA: Diagnosis not present

## 2016-02-18 DIAGNOSIS — H5203 Hypermetropia, bilateral: Secondary | ICD-10-CM | POA: Diagnosis not present

## 2016-02-18 DIAGNOSIS — H52203 Unspecified astigmatism, bilateral: Secondary | ICD-10-CM | POA: Diagnosis not present

## 2016-03-09 DIAGNOSIS — I10 Essential (primary) hypertension: Secondary | ICD-10-CM | POA: Diagnosis not present

## 2016-03-09 DIAGNOSIS — Z79899 Other long term (current) drug therapy: Secondary | ICD-10-CM | POA: Diagnosis not present

## 2016-03-09 DIAGNOSIS — Z6837 Body mass index (BMI) 37.0-37.9, adult: Secondary | ICD-10-CM | POA: Diagnosis not present

## 2016-03-09 DIAGNOSIS — E890 Postprocedural hypothyroidism: Secondary | ICD-10-CM | POA: Diagnosis not present

## 2016-03-09 DIAGNOSIS — Z1389 Encounter for screening for other disorder: Secondary | ICD-10-CM | POA: Diagnosis not present

## 2016-03-09 DIAGNOSIS — C61 Malignant neoplasm of prostate: Secondary | ICD-10-CM | POA: Diagnosis not present

## 2016-03-09 DIAGNOSIS — E7800 Pure hypercholesterolemia, unspecified: Secondary | ICD-10-CM | POA: Diagnosis not present

## 2016-03-09 DIAGNOSIS — F418 Other specified anxiety disorders: Secondary | ICD-10-CM | POA: Diagnosis not present

## 2016-03-09 DIAGNOSIS — E1165 Type 2 diabetes mellitus with hyperglycemia: Secondary | ICD-10-CM | POA: Diagnosis not present

## 2016-04-26 ENCOUNTER — Ambulatory Visit (INDEPENDENT_AMBULATORY_CARE_PROVIDER_SITE_OTHER): Payer: PPO | Admitting: Physician Assistant

## 2016-04-26 ENCOUNTER — Ambulatory Visit (INDEPENDENT_AMBULATORY_CARE_PROVIDER_SITE_OTHER): Payer: PPO

## 2016-04-26 ENCOUNTER — Encounter (INDEPENDENT_AMBULATORY_CARE_PROVIDER_SITE_OTHER): Payer: Self-pay

## 2016-04-26 ENCOUNTER — Encounter (INDEPENDENT_AMBULATORY_CARE_PROVIDER_SITE_OTHER): Payer: Self-pay | Admitting: Physician Assistant

## 2016-04-26 DIAGNOSIS — G8929 Other chronic pain: Secondary | ICD-10-CM | POA: Insufficient documentation

## 2016-04-26 DIAGNOSIS — M25551 Pain in right hip: Secondary | ICD-10-CM | POA: Diagnosis not present

## 2016-04-26 DIAGNOSIS — M5441 Lumbago with sciatica, right side: Secondary | ICD-10-CM

## 2016-04-26 HISTORY — DX: Pain in right hip: M25.551

## 2016-04-26 HISTORY — DX: Other chronic pain: G89.29

## 2016-04-26 NOTE — Progress Notes (Signed)
Office Visit Note   Patient: Mario Burns           Date of Birth: 17-Sep-1945           MRN: [id number] Visit Date: 04/26/2016              Requested by: Leonides Sake, MD [place], [place] [address] PCP: Leonides Sake, MD   Assessment & Plan: Visit Diagnoses:  1. Pain in right hip   2. Chronic right-sided low back pain with right-sided sciatica     Plan: I explained to him due to the fact that he his diabetes is under poor control. I would not place him on a  steroid Dosepak or give him an injection. Therefore will send in physical therapy for both the trochanteric bursitis and his back. This is mainly for a home exercise program. He is to use moist heat on his back. Showed him IT band stretching exercises today. He will start taking his Aleve twice a day 2 tablets with food. See him back in 1 month check his progress or lack of  Follow-Up Instructions: Return in about 4 weeks (around 05/24/2016).   Orders:  Orders Placed This Encounter  Procedures  . XR Lumbar Spine 2-3 Views  . XR HIP UNILAT W OR W/O PELVIS 2-3 VIEWS LEFT   No orders of the defined types were placed in this encounter.     Procedures: No procedures performed   Clinical Data: No additional findings.   Subjective: Chief Complaint  Patient presents with  . Right Leg - Pain, Numbness    HPI Mario Burns 71 year old male were seen today for the first time for right hip and bilateral leg pain. He states he's had pain for the last 6 weeks getting worse. He did have pain some 3 months ago after stepping off the short stool but this went away. Now is right buttocks pain down the right leg is getting worse. It is keeping him awake at night. He describes it as a throbbing pain down the entire leg into the foot. Some stinging sensation. He's had no change in bowel bladder however he does have some urinary incontinence secondary to prostate cancer and surgery. He is diabetic and reports that  he has a hard time controlling his diabetes/ sugars run above 200 often. He's been taking Aleve or Tylenol with minimal relief.  Review of Systems Denies chest pain fevers, chills, bowel dysfunction, change in bladder function. Has shortness breath is unchanged. Radicular symptoms down the right leg and right leg and hip pain.  Objective: Vital Signs: There were no vitals taken for this visit.  Physical Exam  Constitutional: He is oriented to person, place, and time. He appears well-developed and well-nourished. No distress.  Cardiovascular: Intact distal pulses.   Pulmonary/Chest: Effort normal.  Neurological: He is alert and oriented to person, place, and time.  Psychiatric: He has a normal mood and affect. His behavior is normal.    Ortho Exam 5 out of 5 strengths bilateral lower extremities against resistance. Deep tendon reflexes are 2+ at knees and 1+ at the ankles and equal and symmetric. Decreased sensation over the medial aspect of the right foot and over the lateral aspect by bilateral feet. Positive straight leg raise on the right. Has tenderness over the lower lumbar spinal column and the right paraspinous region of the lower lumbar. He comes within 6 inches amenable to touch his toes. He has limited extension and lumbar  spine. Tenderness over the trochanteric region of both hips. Good range of motion of both hips he does have some pain with external rotation of right hip. Specialty Comments:  No specialty comments available.  Imaging: Xr Hip Unilat W Or W/o Pelvis 2-3 Views Left  Result Date: 04/26/2016 AP pelvis: Bilateral hips well located. No acute fractures. Hip joints well-preserved bilaterally.  Xr Lumbar Spine 2-3 Views  Result Date: 04/26/2016 Lumbar spine 2 views: No acute fracture. Endplate spurring at E2-6. Mild anterior spondylolisthesis L4 on 5. Facet changes at L4-5 L5-S1.    PMFS History: Patient Active Problem List   Diagnosis Date Noted  . Pain in  right hip 04/26/2016  . Chronic right-sided low back pain with right-sided sciatica 04/26/2016  . Hyperlipidemia 06/26/2014  . Essential hypertension, benign 06/26/2014  . Incisional hernia with obstruction 04/23/2013  . Incisional hernia, without obstruction or gangrene 01/30/2013  . Bladder neck contracture 08/02/2012  . Diabetes mellitus ([place]) 12/16/2011   Past Medical History:  Diagnosis Date  . Arthritis   . Bladder neck contracture   . Borderline hyperlipidemia   . Claustrophobia   . Complication of anesthesia    CLAUSTROPHOBIC  . GERD (gastroesophageal reflux disease)   . H/O hiatal hernia   . History of prostate cancer    S/P PROSTATECTOMY  . History of thyroid nodule    LARGE GOITER  . Hypertension   . Hypothyroidism, postsurgical   . MG (myasthenia gravis) ([place])    DX 2013 --  NEUROLOGIST--  DR WILLIS/  TREATED W/ MEDICATION UNTIL SYMPTOMS RESOLVED -- NO REMISSION--  SEES DR WILLIS PRN  . OSA (obstructive sleep apnea)    NON-COMPLIANT CPAP--  CLAUSTROPHOBIC  . Shortness of breath    ALWAYS--HAD FOR YEARS - STATES HE CAN'T DO HARDLY ANYTHING WITHOUT GIVING OUT  . Type 2 diabetes mellitus ([place])   . Weak urinary stream     Family History  Problem Relation Age of Onset  . Diabetes Sister   . Diabetes Brother     Past Surgical History:  Procedure Laterality Date  . CYSTOSCOPY N/A 07/30/2013   Procedure: CYSTOSCOPY WITH Anastasio Champion ;  Surgeon: Bernestine Amass, MD;  [facility];  Service: Urology;  Laterality: N/A;  . CYSTOSCOPY WITH URETHRAL DILATATION N/A 08/02/2012   Procedure: CYSTOSCOPY WITH URETHRAL DILATATION, AN REMOVAL STAPLE FROM BLADDER NECK;  Surgeon: Bernestine Amass, MD;  Location: [facility];  Service: Urology;  Laterality: N/A;  . HEMORRHOID SURGERY  1990's  . INCISIONAL HERNIA REPAIR N/A 04/23/2013   Procedure: LAPAROSCOPIC INCISIONAL HERNIA;  Surgeon: [name], MD;  Location: [facility];  Service: General;   Laterality: N/A;  . INSERTION OF MESH N/A 04/23/2013   Procedure: INSERTION OF MESH;  Surgeon: [name], MD;  Location: [facility];  Service: General;  Laterality: N/A;  . ROBOT ASSISTED LAPAROSCOPIC RADICAL PROSTATECTOMY  12/15/2011   Procedure: ROBOTIC ASSISTED LAPAROSCOPIC RADICAL PROSTATECTOMY;  Surgeon: Bernestine Amass, MD;  Location: [facility];  Service: Urology;  Laterality: N/A;  . TOTAL THYROIDECTOMY  05-24-2003   Social History   Occupational History  . Not on file.   Social History Main Topics  . Smoking status: Never Smoker  . Smokeless tobacco: Never Used  . Alcohol use No  . Drug use: No  . Sexual activity: Not on file

## 2016-05-05 DIAGNOSIS — M79605 Pain in left leg: Secondary | ICD-10-CM | POA: Diagnosis not present

## 2016-05-05 DIAGNOSIS — M79604 Pain in right leg: Secondary | ICD-10-CM | POA: Diagnosis not present

## 2016-05-05 DIAGNOSIS — M545 Low back pain: Secondary | ICD-10-CM | POA: Diagnosis not present

## 2016-05-05 DIAGNOSIS — M6281 Muscle weakness (generalized): Secondary | ICD-10-CM | POA: Diagnosis not present

## 2016-05-11 DIAGNOSIS — M79604 Pain in right leg: Secondary | ICD-10-CM | POA: Diagnosis not present

## 2016-05-11 DIAGNOSIS — M6281 Muscle weakness (generalized): Secondary | ICD-10-CM | POA: Diagnosis not present

## 2016-05-11 DIAGNOSIS — M79605 Pain in left leg: Secondary | ICD-10-CM | POA: Diagnosis not present

## 2016-05-11 DIAGNOSIS — M545 Low back pain: Secondary | ICD-10-CM | POA: Diagnosis not present

## 2016-05-26 ENCOUNTER — Ambulatory Visit (INDEPENDENT_AMBULATORY_CARE_PROVIDER_SITE_OTHER): Payer: PPO | Admitting: Physician Assistant

## 2016-09-15 DIAGNOSIS — E890 Postprocedural hypothyroidism: Secondary | ICD-10-CM | POA: Diagnosis not present

## 2016-09-15 DIAGNOSIS — Z6826 Body mass index (BMI) 26.0-26.9, adult: Secondary | ICD-10-CM | POA: Diagnosis not present

## 2016-09-15 DIAGNOSIS — R42 Dizziness and giddiness: Secondary | ICD-10-CM | POA: Diagnosis not present

## 2016-09-15 DIAGNOSIS — E663 Overweight: Secondary | ICD-10-CM | POA: Diagnosis not present

## 2016-09-15 DIAGNOSIS — E1165 Type 2 diabetes mellitus with hyperglycemia: Secondary | ICD-10-CM | POA: Diagnosis not present

## 2016-09-23 DIAGNOSIS — E1165 Type 2 diabetes mellitus with hyperglycemia: Secondary | ICD-10-CM | POA: Diagnosis not present

## 2016-09-23 DIAGNOSIS — G7000 Myasthenia gravis without (acute) exacerbation: Secondary | ICD-10-CM | POA: Diagnosis not present

## 2016-09-23 DIAGNOSIS — I10 Essential (primary) hypertension: Secondary | ICD-10-CM | POA: Diagnosis not present

## 2016-09-23 DIAGNOSIS — E890 Postprocedural hypothyroidism: Secondary | ICD-10-CM | POA: Diagnosis not present

## 2016-09-23 DIAGNOSIS — Z6826 Body mass index (BMI) 26.0-26.9, adult: Secondary | ICD-10-CM | POA: Diagnosis not present

## 2017-01-24 DIAGNOSIS — I10 Essential (primary) hypertension: Secondary | ICD-10-CM | POA: Diagnosis not present

## 2017-01-24 DIAGNOSIS — Z125 Encounter for screening for malignant neoplasm of prostate: Secondary | ICD-10-CM | POA: Diagnosis not present

## 2017-01-24 DIAGNOSIS — E1165 Type 2 diabetes mellitus with hyperglycemia: Secondary | ICD-10-CM | POA: Diagnosis not present

## 2017-01-24 DIAGNOSIS — E785 Hyperlipidemia, unspecified: Secondary | ICD-10-CM | POA: Diagnosis not present

## 2017-01-24 DIAGNOSIS — E7800 Pure hypercholesterolemia, unspecified: Secondary | ICD-10-CM | POA: Diagnosis not present

## 2017-01-24 DIAGNOSIS — Z0000 Encounter for general adult medical examination without abnormal findings: Secondary | ICD-10-CM | POA: Diagnosis not present

## 2017-01-24 DIAGNOSIS — C61 Malignant neoplasm of prostate: Secondary | ICD-10-CM | POA: Diagnosis not present

## 2017-01-24 DIAGNOSIS — E890 Postprocedural hypothyroidism: Secondary | ICD-10-CM | POA: Diagnosis not present

## 2017-01-24 DIAGNOSIS — Z9181 History of falling: Secondary | ICD-10-CM | POA: Diagnosis not present

## 2017-01-24 DIAGNOSIS — Z139 Encounter for screening, unspecified: Secondary | ICD-10-CM | POA: Diagnosis not present

## 2017-01-24 DIAGNOSIS — Z136 Encounter for screening for cardiovascular disorders: Secondary | ICD-10-CM | POA: Diagnosis not present

## 2017-06-03 DIAGNOSIS — E890 Postprocedural hypothyroidism: Secondary | ICD-10-CM | POA: Diagnosis not present

## 2017-06-03 DIAGNOSIS — E7800 Pure hypercholesterolemia, unspecified: Secondary | ICD-10-CM | POA: Diagnosis not present

## 2017-06-03 DIAGNOSIS — G7000 Myasthenia gravis without (acute) exacerbation: Secondary | ICD-10-CM | POA: Diagnosis not present

## 2017-06-03 DIAGNOSIS — E1165 Type 2 diabetes mellitus with hyperglycemia: Secondary | ICD-10-CM | POA: Diagnosis not present

## 2017-06-03 DIAGNOSIS — Z6826 Body mass index (BMI) 26.0-26.9, adult: Secondary | ICD-10-CM | POA: Diagnosis not present

## 2017-06-03 DIAGNOSIS — I10 Essential (primary) hypertension: Secondary | ICD-10-CM | POA: Diagnosis not present

## 2017-06-15 DIAGNOSIS — Z6826 Body mass index (BMI) 26.0-26.9, adult: Secondary | ICD-10-CM | POA: Diagnosis not present

## 2017-06-15 DIAGNOSIS — R7309 Other abnormal glucose: Secondary | ICD-10-CM | POA: Diagnosis not present

## 2017-06-29 DIAGNOSIS — N3946 Mixed incontinence: Secondary | ICD-10-CM | POA: Diagnosis not present

## 2017-06-29 DIAGNOSIS — R609 Edema, unspecified: Secondary | ICD-10-CM | POA: Diagnosis not present

## 2017-06-29 DIAGNOSIS — Z1339 Encounter for screening examination for other mental health and behavioral disorders: Secondary | ICD-10-CM | POA: Diagnosis not present

## 2017-06-29 DIAGNOSIS — E1165 Type 2 diabetes mellitus with hyperglycemia: Secondary | ICD-10-CM | POA: Diagnosis not present

## 2017-08-04 DIAGNOSIS — I10 Essential (primary) hypertension: Secondary | ICD-10-CM | POA: Diagnosis not present

## 2017-08-04 DIAGNOSIS — E1165 Type 2 diabetes mellitus with hyperglycemia: Secondary | ICD-10-CM | POA: Diagnosis not present

## 2017-08-04 DIAGNOSIS — E7800 Pure hypercholesterolemia, unspecified: Secondary | ICD-10-CM | POA: Diagnosis not present

## 2017-08-04 DIAGNOSIS — E890 Postprocedural hypothyroidism: Secondary | ICD-10-CM | POA: Diagnosis not present

## 2017-08-04 DIAGNOSIS — Z6825 Body mass index (BMI) 25.0-25.9, adult: Secondary | ICD-10-CM | POA: Diagnosis not present

## 2017-10-24 DIAGNOSIS — Z6826 Body mass index (BMI) 26.0-26.9, adult: Secondary | ICD-10-CM | POA: Diagnosis not present

## 2017-10-24 DIAGNOSIS — K219 Gastro-esophageal reflux disease without esophagitis: Secondary | ICD-10-CM | POA: Diagnosis not present

## 2017-10-24 DIAGNOSIS — E1165 Type 2 diabetes mellitus with hyperglycemia: Secondary | ICD-10-CM | POA: Diagnosis not present

## 2017-10-24 DIAGNOSIS — G7000 Myasthenia gravis without (acute) exacerbation: Secondary | ICD-10-CM | POA: Diagnosis not present

## 2017-10-24 DIAGNOSIS — R1310 Dysphagia, unspecified: Secondary | ICD-10-CM | POA: Diagnosis not present

## 2017-10-25 ENCOUNTER — Telehealth: Payer: Self-pay | Admitting: Neurology

## 2017-10-25 NOTE — Telephone Encounter (Signed)
Spoke with pt. and gave appt. 0730 10/26/17/fim

## 2017-10-25 NOTE — Telephone Encounter (Signed)
The primary doctor called, the patient has been seen here previously in the distant past for myasthenia gravis, he is having recurrence of symptoms with difficulty with chewing, will need to work in the patient soon, he does have diabetes unfortunately and he will have to go on prednisone, his diabetes is not well controlled.  We will get him worked in as a new patient.

## 2017-10-26 ENCOUNTER — Ambulatory Visit (INDEPENDENT_AMBULATORY_CARE_PROVIDER_SITE_OTHER): Payer: PPO | Admitting: Neurology

## 2017-10-26 ENCOUNTER — Encounter: Payer: Self-pay | Admitting: Neurology

## 2017-10-26 VITALS — BP 131/82 | HR 62 | Ht 69.0 in | Wt 174.8 lb

## 2017-10-26 DIAGNOSIS — G7000 Myasthenia gravis without (acute) exacerbation: Secondary | ICD-10-CM

## 2017-10-26 DIAGNOSIS — Z5181 Encounter for therapeutic drug level monitoring: Secondary | ICD-10-CM | POA: Diagnosis not present

## 2017-10-26 DIAGNOSIS — G7001 Myasthenia gravis with (acute) exacerbation: Secondary | ICD-10-CM

## 2017-10-26 HISTORY — DX: Myasthenia gravis with (acute) exacerbation: G70.01

## 2017-10-26 MED ORDER — PYRIDOSTIGMINE BROMIDE 60 MG PO TABS: 30.0000 mg | ORAL_TABLET | Freq: Three times a day (TID) | ORAL | 1 refills | Status: DC

## 2017-10-26 MED ORDER — MYCOPHENOLATE MOFETIL 500 MG PO TABS: 500.0000 mg | ORAL_TABLET | Freq: Two times a day (BID) | ORAL | 1 refills | Status: DC

## 2017-10-26 MED ORDER — PREDNISONE 20 MG PO TABS: 20.0000 mg | ORAL_TABLET | Freq: Every day | ORAL | 1 refills | Status: DC

## 2017-10-26 NOTE — Patient Instructions (Signed)
We will start Mestinon 60 mg taking 1/2 tablet twice a day, and we will begin prednisone 20 mg a day and Cellcept 500 mg twice a day for the myasthenia.

## 2017-10-26 NOTE — Progress Notes (Signed)
Reason for visit: Myasthenia gravis  Referring physician: Dr. Julianne Handler is a 72 y.o. male  History of present illness:  [name] is a 72 year old right-handed white male with a history of diabetes and myasthenia gravis.  The patient was diagnosed in December 2012, the patient had pharyngeal onset of myasthenia gravis. He was treated for about 6 months with medication that included prednisone and Mestinon and Imuran, and seemed to do quite well and he stopped the medication, and has done very well until just recently.  Within the last 6 months he has had some sensation of weakness in the legs, within the last week or so, he has developed problems with chewing and with swallowing.  He will fatigue during the meal, he may initially do fairly well but midway into the meal he may have increasing problems with chewing his food, swallowing, and with speech.  The patient has not had any ptosis or double vision.  He does report some generalized weakness and fatigue.  He has diabetes that is not well controlled.  He does report some numbness in the hands and feet, he denies any significant balance issues.  He has urinary incontinence following surgery for prostate cancer.  The bowels appear to work fairly well.  He reports some shortness of breath but claims that this is been a problem for several years.  This is not changed recently.  He does have some left neck and shoulder discomfort, he has difficulty elevating the left arm because of shoulder pain.  He has also reported some recent problems with pain running down the right leg to the foot.  He is sent to this office for an evaluation.  Past Medical History:  Diagnosis Date  . Arthritis   . Bladder neck contracture   . Borderline hyperlipidemia   . Claustrophobia   . Complication of anesthesia    CLAUSTROPHOBIC  . GERD (gastroesophageal reflux disease)   . H/O hiatal hernia   . History of prostate cancer    S/P PROSTATECTOMY  .  History of thyroid nodule    LARGE GOITER  . Hypertension   . Hypothyroidism, postsurgical   . MG (myasthenia gravis) ([place])    DX 2013 --  NEUROLOGIST--  DR Cheryl Chay/  TREATED W/ MEDICATION UNTIL SYMPTOMS RESOLVED -- NO REMISSION--  SEES DR Aahan Marques PRN  . OSA (obstructive sleep apnea)    NON-COMPLIANT CPAP--  CLAUSTROPHOBIC  . Shortness of breath    ALWAYS--HAD FOR YEARS - STATES HE CAN'T DO HARDLY ANYTHING WITHOUT GIVING OUT  . Type 2 diabetes mellitus ([place])   . Weak urinary stream     Past Surgical History:  Procedure Laterality Date  . CYSTOSCOPY N/A 07/30/2013   Procedure: CYSTOSCOPY WITH Anastasio Champion ;  Surgeon: Bernestine Amass, MD;  [facility];  Service: Urology;  Laterality: N/A;  . CYSTOSCOPY WITH URETHRAL DILATATION N/A 08/02/2012   Procedure: CYSTOSCOPY WITH URETHRAL DILATATION, AN REMOVAL STAPLE FROM BLADDER NECK;  Surgeon: Bernestine Amass, MD;  Location: [facility];  Service: Urology;  Laterality: N/A;  . HEMORRHOID SURGERY  1990's  . INCISIONAL HERNIA REPAIR N/A 04/23/2013   Procedure: LAPAROSCOPIC INCISIONAL HERNIA;  Surgeon: [name], MD;  Location: [facility];  Service: General;  Laterality: N/A;  . INSERTION OF MESH N/A 04/23/2013   Procedure: INSERTION OF MESH;  Surgeon: [name], MD;  Location: [facility];  Service: General;  Laterality: N/A;  . ROBOT  ASSISTED LAPAROSCOPIC RADICAL PROSTATECTOMY  12/15/2011   Procedure: ROBOTIC ASSISTED LAPAROSCOPIC RADICAL PROSTATECTOMY;  Surgeon: Bernestine Amass, MD;  Location: [facility];  Service: Urology;  Laterality: N/A;  . TOTAL THYROIDECTOMY  05-24-2003    Family History  Problem Relation Age of Onset  . Heart disease Mother   . Glaucoma Mother   . Hypertension Mother   . Colon cancer Father   . Diabetes Sister   . Diabetes Brother     Social history:  reports that he has never smoked. He has never used smokeless tobacco. He reports that he does not drink alcohol or use  drugs.  Medications:  Prior to Admission medications   Medication Sig Start Date End Date Taking? Authorizing Provider  bisacodyl (BISACODYL) 5 MG EC tablet Take 5 mg by mouth daily as needed for moderate constipation.   Yes [provider]  glimepiride (AMARYL) 4 MG tablet Take 4 mg by mouth 2 (two) times daily.   Yes [provider]  Levothyroxine Sodium 150 MCG CAPS Take 150 mcg by mouth every morning.    Yes [provider]  losartan (COZAAR) 50 MG tablet Take 50 mg by mouth daily.   Yes [provider]  naproxen sodium (ANAPROX) 220 MG tablet Take 440 mg by mouth every morning.    Yes [provider]  omeprazole (PRILOSEC) 20 MG capsule Take 20 mg by mouth every evening.    Yes [provider]  ONE TOUCH ULTRA TEST test strip  03/23/16  Yes [provider]  Jonetta Speak LANCETS [address] [place]  03/23/16  Yes [provider]      Allergies  Allergen Reactions  . Sulfa Antibiotics Other (See Comments)    unknown    ROS:  Out of a complete 14 system review of symptoms, the patient complains only of the following symptoms, and all other reviewed systems are negative.  Swelling in the legs Difficulty swallowing Itching Shortness of breath, snoring Urination problems, impotence Joint pain, joint swelling, muscle cramps, aching muscles  Blood pressure 131/82, pulse 62, height 5\' 9"  (1.753 m), weight 174 lb 12 oz (79.3 kg).  Physical Exam  General: The patient is alert and cooperative at the time of the examination.  Eyes: Pupils are equal, round, and reactive to light. Discs are flat bilaterally.  Neck: The neck is supple, no carotid bruits are noted.  Respiratory: The respiratory examination is clear.  Cardiovascular: The cardiovascular examination reveals a regular rate and rhythm, no obvious murmurs or rubs are noted.  Skin: Extremities are without significant edema.  Neurologic Exam  Mental status:  The patient is alert and oriented x 3 at the time of the examination. The patient has apparent normal recent and remote memory, with an apparently normal attention span and concentration ability.  Cranial nerves: Facial symmetry is present. There is good sensation of the face to pinprick and soft touch bilaterally. The strength of the facial muscles and the muscles to head turning and shoulder shrug are normal bilaterally. Speech is well enunciated, no aphasia or dysarthria is noted. Extraocular movements are full. Visual fields are full. The tongue is midline, and the patient has symmetric elevation of the soft palate. No obvious hearing deficits are noted.  With superior gaze, the patient reports subjective double vision within about 45 seconds, the left eye appears to be inferior to the right.  Motor: The motor testing reveals 5 over 5 strength of all 4 extremities. Good symmetric motor tone is  noted throughout.  With arms outstretched for 1 minute, no fatigable weakness of the deltoid muscles was seen.  Sensory: Sensory testing is intact to pinprick, soft touch, vibration sensation, and position sense on all 4 extremities, with exception of slight reduction of vibration and position sense in both feet, left greater than right. No evidence of extinction is noted.  Coordination: Cerebellar testing reveals good finger-nose-finger and heel-to-shin bilaterally.  Gait and station: Gait is normal. Tandem gait is slightly unsteady. Romberg is negative. No drift is seen.  Reflexes: Deep tendon reflexes are symmetric, but reduced bilaterally. Toes are downgoing bilaterally.   Assessment/Plan:  1.  Myasthenia gravis  2.  Diabetes  The patient appears to have pharyngeal features to his myasthenia gravis at this point, similar to what was noted at the time of his initial diagnosis.  He denies any new medications that have been added or any intercurrent illness that may have brought on his symptoms  recently.  It is a bit unusual for someone to do as well as he has done over many years and then suddenly begin to have new symptoms.  The patient will be placed on prednisone 20 mg daily, Mestinon will be added taking 30 mg 3 times daily, this dose can be adjusted if he is tolerating the medication.  The patient will be placed on CellCept 500 mg twice daily, blood work will be done today.  He will follow-up in 3 months.  He will call for any new issues.  Jill Alexanders MD 10/26/2017 7:41 AM  [facility] [address] [place], [place] [address]  Phone [phone] Fax [phone]

## 2017-10-27 ENCOUNTER — Telehealth: Payer: Self-pay | Admitting: *Deleted

## 2017-10-27 LAB — COMPREHENSIVE METABOLIC PANEL
ALBUMIN: 4.2 g/dL (ref 3.5–4.8)
ALK PHOS: 82 IU/L (ref 39–117)
ALT: 24 IU/L (ref 0–44)
AST: 17 IU/L (ref 0–40)
Albumin/Globulin Ratio: 2.1 (ref 1.2–2.2)
BILIRUBIN TOTAL: 0.4 mg/dL (ref 0.0–1.2)
BUN / CREAT RATIO: 21 (ref 10–24)
BUN: 14 mg/dL (ref 8–27)
CO2: 22 mmol/L (ref 20–29)
CREATININE: 0.66 mg/dL — AB (ref 0.76–1.27)
Calcium: 9.2 mg/dL (ref 8.6–10.2)
Chloride: 102 mmol/L (ref 96–106)
GFR calc Af Amer: 112 mL/min/{1.73_m2} (ref >59)
GFR calc non Af Amer: 97 mL/min/{1.73_m2} (ref >59)
GLOBULIN, TOTAL: 2 g/dL (ref 1.5–4.5)
Glucose: 190 mg/dL — ABNORMAL HIGH (ref 65–99)
Potassium: 4.4 mmol/L (ref 3.5–5.2)
SODIUM: 136 mmol/L (ref 134–144)
Total Protein: 6.2 g/dL (ref 6.0–8.5)

## 2017-10-27 LAB — CBC WITH DIFFERENTIAL/PLATELET
Basophils Absolute: 0.1 10*3/uL (ref 0.0–0.2)
Basos: 1 %
EOS (ABSOLUTE): 0.2 10*3/uL (ref 0.0–0.4)
Eos: 3 %
HEMATOCRIT: 40.7 % (ref 37.5–51.0)
Hemoglobin: 13.9 g/dL (ref 13.0–17.7)
Immature Grans (Abs): 0 10*3/uL (ref 0.0–0.1)
Immature Granulocytes: 0 %
LYMPHS ABS: 2.4 10*3/uL (ref 0.7–3.1)
Lymphs: 39 %
MCH: 31.3 pg (ref 26.6–33.0)
MCHC: 34.2 g/dL (ref 31.5–35.7)
MCV: 92 fL (ref 79–97)
MONOS ABS: 0.6 10*3/uL (ref 0.1–0.9)
Monocytes: 9 %
Neutrophils Absolute: 3 10*3/uL (ref 1.4–7.0)
Neutrophils: 48 %
Platelets: 212 10*3/uL (ref 150–450)
RBC: 4.44 x10E6/uL (ref 4.14–5.80)
RDW: 12.3 % (ref 12.3–15.4)
WBC: 6.2 10*3/uL (ref 3.4–10.8)

## 2017-10-27 NOTE — Telephone Encounter (Signed)
-----   Message from Hope Pigeon, RN sent at 10/27/2017  8:06 AM EDT -----   ----- Message ----- From: Kathrynn Ducking, MD Sent: 10/27/2017   7:15 AM EDT To: Emilia Beck Tor Tsuda, RN  Blood work is unremarkable with exception of glucose level of 190. Please call the patient. ----- Message ----- From: Lavone Neri Lab Results In Sent: 10/27/2017   5:40 AM EDT To: Kathrynn Ducking, MD

## 2017-10-27 NOTE — Telephone Encounter (Signed)
LMOM with below lab results.  He does not need to return this call unless he has questions/fim 

## 2017-10-28 ENCOUNTER — Telehealth: Payer: Self-pay | Admitting: Neurology

## 2017-10-28 DIAGNOSIS — E1165 Type 2 diabetes mellitus with hyperglycemia: Secondary | ICD-10-CM | POA: Diagnosis not present

## 2017-10-28 DIAGNOSIS — E890 Postprocedural hypothyroidism: Secondary | ICD-10-CM | POA: Diagnosis not present

## 2017-10-28 DIAGNOSIS — E7800 Pure hypercholesterolemia, unspecified: Secondary | ICD-10-CM | POA: Diagnosis not present

## 2017-10-28 DIAGNOSIS — I10 Essential (primary) hypertension: Secondary | ICD-10-CM | POA: Diagnosis not present

## 2017-10-28 NOTE — Telephone Encounter (Signed)
Called wife to let her know PA completed and pending. We will keep them updated. She verbalized understanding.

## 2017-10-28 NOTE — Telephone Encounter (Signed)
Pt wife(on DPR-Markarian,Doris L) has called to inform they received a call re: the mycophenolate (CELLCEPT) 500 MG tablet Insurance has denied it and it cost $800.00 pt unable to afford, wife asking for a call to discuss

## 2017-10-28 NOTE — Telephone Encounter (Signed)
Faxed printed/signed PA form to [employer] at [phone]. Received fax confirmation. Marked  as urgent. Waiting on determination.

## 2017-10-28 NOTE — Telephone Encounter (Signed)
Received fax request form to complete for PA Cellcept from [employer]. This included more questions. I completed. Waiting on MD signature. Will then fax baack

## 2017-10-28 NOTE — Telephone Encounter (Signed)
I submitted PA request on covermymeds. Key: [id number]. Waiting on determinatoin.  "[employer] has received your information, and the request will be reviewed. You will receive an electronic determination in CoverMyMeds. You can see the latest determination by locating this request on your dashboard or by reopening this request. You will also receive a faxed copy of the determination. If you have any questions please contact [employer] at [phone]."

## 2017-10-31 NOTE — Telephone Encounter (Signed)
Received fax notification from [employer] that PA approved 10/28/17-01/04/19.  Faxed notice of approval to [place] at [phone]. Received fax confirmation.

## 2017-11-02 DIAGNOSIS — I10 Essential (primary) hypertension: Secondary | ICD-10-CM | POA: Diagnosis not present

## 2017-11-02 DIAGNOSIS — E890 Postprocedural hypothyroidism: Secondary | ICD-10-CM | POA: Diagnosis not present

## 2017-11-02 DIAGNOSIS — E1165 Type 2 diabetes mellitus with hyperglycemia: Secondary | ICD-10-CM | POA: Diagnosis not present

## 2017-11-02 DIAGNOSIS — E7800 Pure hypercholesterolemia, unspecified: Secondary | ICD-10-CM | POA: Diagnosis not present

## 2017-11-07 NOTE — Telephone Encounter (Signed)
Pt wife(on DPR-Mckain,Doris L) has called to inform that she took the letter to [place] on Saturday 11-02 and was told a prior authorization is needed from Dr Jannifer Franklin.  Please call

## 2017-11-07 NOTE — Telephone Encounter (Signed)
I called wife back and LVM letting her know PA already completed and approved. Asked her to call [employer] to make sure they have run a claim again. IF they still need something, she can call back and let us know.

## 2018-01-12 DIAGNOSIS — E11319 Type 2 diabetes mellitus with unspecified diabetic retinopathy without macular edema: Secondary | ICD-10-CM | POA: Diagnosis not present

## 2018-01-30 DIAGNOSIS — I10 Essential (primary) hypertension: Secondary | ICD-10-CM | POA: Diagnosis not present

## 2018-01-30 DIAGNOSIS — E1165 Type 2 diabetes mellitus with hyperglycemia: Secondary | ICD-10-CM | POA: Diagnosis not present

## 2018-01-30 DIAGNOSIS — C61 Malignant neoplasm of prostate: Secondary | ICD-10-CM | POA: Diagnosis not present

## 2018-01-30 DIAGNOSIS — E890 Postprocedural hypothyroidism: Secondary | ICD-10-CM | POA: Diagnosis not present

## 2018-01-30 DIAGNOSIS — E7800 Pure hypercholesterolemia, unspecified: Secondary | ICD-10-CM | POA: Diagnosis not present

## 2018-01-31 ENCOUNTER — Ambulatory Visit: Payer: PPO | Admitting: Neurology

## 2018-02-03 DIAGNOSIS — Z9181 History of falling: Secondary | ICD-10-CM | POA: Diagnosis not present

## 2018-02-03 DIAGNOSIS — E1165 Type 2 diabetes mellitus with hyperglycemia: Secondary | ICD-10-CM | POA: Diagnosis not present

## 2018-02-03 DIAGNOSIS — Z1331 Encounter for screening for depression: Secondary | ICD-10-CM | POA: Diagnosis not present

## 2018-02-03 DIAGNOSIS — Z6827 Body mass index (BMI) 27.0-27.9, adult: Secondary | ICD-10-CM | POA: Diagnosis not present

## 2018-02-03 DIAGNOSIS — M1711 Unilateral primary osteoarthritis, right knee: Secondary | ICD-10-CM | POA: Diagnosis not present

## 2018-02-03 DIAGNOSIS — E7800 Pure hypercholesterolemia, unspecified: Secondary | ICD-10-CM | POA: Diagnosis not present

## 2018-02-03 DIAGNOSIS — E890 Postprocedural hypothyroidism: Secondary | ICD-10-CM | POA: Diagnosis not present

## 2018-02-03 DIAGNOSIS — G7000 Myasthenia gravis without (acute) exacerbation: Secondary | ICD-10-CM | POA: Diagnosis not present

## 2018-02-03 DIAGNOSIS — I10 Essential (primary) hypertension: Secondary | ICD-10-CM | POA: Diagnosis not present

## 2018-02-15 DIAGNOSIS — E1165 Type 2 diabetes mellitus with hyperglycemia: Secondary | ICD-10-CM | POA: Diagnosis not present

## 2018-02-15 DIAGNOSIS — R51 Headache: Secondary | ICD-10-CM | POA: Diagnosis not present

## 2018-02-15 DIAGNOSIS — Z6826 Body mass index (BMI) 26.0-26.9, adult: Secondary | ICD-10-CM | POA: Diagnosis not present

## 2018-02-15 DIAGNOSIS — G7000 Myasthenia gravis without (acute) exacerbation: Secondary | ICD-10-CM | POA: Diagnosis not present

## 2018-02-15 DIAGNOSIS — M1711 Unilateral primary osteoarthritis, right knee: Secondary | ICD-10-CM | POA: Diagnosis not present

## 2018-03-15 ENCOUNTER — Encounter: Payer: Self-pay | Admitting: Neurology

## 2018-03-15 ENCOUNTER — Other Ambulatory Visit: Payer: Self-pay

## 2018-03-15 ENCOUNTER — Telehealth: Payer: Self-pay | Admitting: *Deleted

## 2018-03-15 ENCOUNTER — Ambulatory Visit: Payer: PPO | Admitting: Neurology

## 2018-03-15 VITALS — BP 127/80 | HR 74 | Ht 69.0 in | Wt 167.0 lb

## 2018-03-15 DIAGNOSIS — G7000 Myasthenia gravis without (acute) exacerbation: Secondary | ICD-10-CM

## 2018-03-15 MED ORDER — AZATHIOPRINE 50 MG PO TABS: 50.0000 mg | ORAL_TABLET | Freq: Two times a day (BID) | ORAL | 5 refills | Status: DC

## 2018-03-15 MED ORDER — DEXAMETHASONE 2 MG PO TABS: 2.0000 mg | ORAL_TABLET | Freq: Every day | ORAL | 3 refills | Status: DC

## 2018-03-15 MED ORDER — PYRIDOSTIGMINE BROMIDE 60 MG PO TABS: 30.0000 mg | ORAL_TABLET | Freq: Three times a day (TID) | ORAL | Status: DC

## 2018-03-15 NOTE — Telephone Encounter (Signed)
LMOM for pt. to call to confirm insurance.  His Imuran needs a PA, but I don't see that he has prescription coverage. Does he have prescription coverage?/fim

## 2018-03-15 NOTE — Telephone Encounter (Signed)
PA for Imuran 50mg  #60/30 completed via CoverMyMeds. Key# [id number].  Dx: Myasthenia Gravis (G70.00)/fim

## 2018-03-15 NOTE — Progress Notes (Signed)
Reason for visit: Myasthenia gravis  Mario Burns is an 73 y.o. male  History of present illness:  Mario Burns is a 73 year old right-handed white male with a history of myasthenia gravis with pharyngeal features.  When last seen 6 months ago he had reported onset of difficulty with swallowing similar to his initial problems.  The patient was placed on Mestinon 30 mg 3 times daily, he was placed on 20 mg of prednisone and CellCept 500 mg twice daily.  The patient has gone off of all of his medications.  The patient took 1 CellCept tablet and had some stomach upset and then stop the medication.  The patient felt somewhat anxious and nervous on the prednisone and then stopped that medication as well.  More recently he has had worsening of his swallowing, he has lost 4 or 5 pounds of weight.  He is started back on all 3 medications and then had swelling around the lips and tongue, and then he stopped the drugs.  He is now on no medical therapy for his myasthenia gravis.  The patient did not call this office with indicating he was having a problem.  The patient indicates that he is having increasing problems with swallowing food and chewing.  He is drooling some.  He does not believe he has had significant weakness of the arms or legs.  He has never had double vision or ptosis.  He returns to the office today for an evaluation.  He does have diabetes.  Past Medical History:  Diagnosis Date  . Arthritis   . Bladder neck contracture   . Borderline hyperlipidemia   . Claustrophobia   . Complication of anesthesia    CLAUSTROPHOBIC  . GERD (gastroesophageal reflux disease)   . H/O hiatal hernia   . History of prostate cancer    S/P PROSTATECTOMY  . History of thyroid nodule    LARGE GOITER  . Hypertension   . Hypothyroidism, postsurgical   . MG (myasthenia gravis) ([place])    DX 2013 --  NEUROLOGIST--  DR Sadira Standard/  TREATED W/ MEDICATION UNTIL SYMPTOMS RESOLVED -- NO REMISSION--  SEES DR Hersey Maclellan  PRN  . OSA (obstructive sleep apnea)    NON-COMPLIANT CPAP--  CLAUSTROPHOBIC  . Shortness of breath    ALWAYS--HAD FOR YEARS - STATES HE CAN'T DO HARDLY ANYTHING WITHOUT GIVING OUT  . Type 2 diabetes mellitus ([place])   . Weak urinary stream     Past Surgical History:  Procedure Laterality Date  . CYSTOSCOPY N/A 07/30/2013   Procedure: CYSTOSCOPY WITH Anastasio Champion ;  Surgeon: Bernestine Amass, MD;  [facility];  Service: Urology;  Laterality: N/A;  . CYSTOSCOPY WITH URETHRAL DILATATION N/A 08/02/2012   Procedure: CYSTOSCOPY WITH URETHRAL DILATATION, AN REMOVAL STAPLE FROM BLADDER NECK;  Surgeon: Bernestine Amass, MD;  Location: [facility];  Service: Urology;  Laterality: N/A;  . HEMORRHOID SURGERY  1990's  . INCISIONAL HERNIA REPAIR N/A 04/23/2013   Procedure: LAPAROSCOPIC INCISIONAL HERNIA;  Surgeon: [name], MD;  Location: [facility];  Service: General;  Laterality: N/A;  . INSERTION OF MESH N/A 04/23/2013   Procedure: INSERTION OF MESH;  Surgeon: [name], MD;  Location: [facility];  Service: General;  Laterality: N/A;  . ROBOT ASSISTED LAPAROSCOPIC RADICAL PROSTATECTOMY  12/15/2011   Procedure: ROBOTIC ASSISTED LAPAROSCOPIC RADICAL PROSTATECTOMY;  Surgeon: Bernestine Amass, MD;  Location: [facility];  Service: Urology;  Laterality: N/A;  . TOTAL  THYROIDECTOMY  05-24-2003    Family History  Problem Relation Age of Onset  . Heart disease Mother   . Glaucoma Mother   . Hypertension Mother   . Colon cancer Father   . Diabetes Sister   . Diabetes Brother   . Diabetes Brother   . Cancer Brother     Social history:  reports that he has never smoked. He has never used smokeless tobacco. He reports that he does not drink alcohol or use drugs.    Allergies  Allergen Reactions  . Sulfa Antibiotics Other (See Comments)    unknown    Medications:  Prior to Admission medications   Medication Sig Start Date End Date Taking? Authorizing  Provider  bisacodyl (BISACODYL) 5 MG EC tablet Take 5 mg by mouth daily as needed for moderate constipation.   Yes [provider]  glimepiride (AMARYL) 4 MG tablet Take 4 mg by mouth 2 (two) times daily.   Yes [provider]  Levothyroxine Sodium 150 MCG CAPS Take 150 mcg by mouth every morning.    Yes [provider]  losartan (COZAAR) 50 MG tablet Take 50 mg by mouth daily.   Yes [provider]  naproxen sodium (ANAPROX) 220 MG tablet Take 440 mg by mouth every morning.    Yes [provider]  omeprazole (PRILOSEC) 20 MG capsule Take 20 mg by mouth every evening.    Yes [provider]  ONE TOUCH ULTRA TEST test strip  03/23/16  Yes [provider]  Jonetta Speak LANCETS [address] [place]  03/23/16  Yes [provider]    ROS:  Out of a complete 14 system review of symptoms, the patient complains only of the following symptoms, and all other reviewed systems are negative.  Facial swelling, difficulty swallowing, drooling Blurred vision Shortness of breath, chest tightness Chest pain, leg swelling Abdominal pain, rectal bleeding Joint pain, joint swelling, aching muscles, walking difficulty, neck pain, neck stiffness Headache, speech difficulty, weakness  Blood pressure 127/80, pulse 74, height 5\' 9"  (1.753 m), weight 167 lb (75.8 kg).  Physical Exam  General: The patient is alert and cooperative at the time of the examination.  Skin: No significant peripheral edema is noted.   Neurologic Exam  Mental status: The patient is alert and oriented x 3 at the time of the examination. The patient has apparent normal recent and remote memory, with an apparently normal attention span and concentration ability.   Cranial nerves: Facial symmetry is present. Speech is normal, no aphasia or dysarthria is noted. Extraocular movements are full. Visual fields are full.  Motor: The patient has good strength in all 4 extremities.   Sensory examination: Soft touch sensation is symmetric on the face, arms, and legs.  Coordination: The patient has good finger-nose-finger and heel-to-shin bilaterally.  Gait and station: The patient has a limping gait on the right leg. Tandem gait is normal. Romberg is negative. No drift is seen.  Reflexes: Deep tendon reflexes are symmetric.   Assessment/Plan:  1.  Myasthenia gravis with pharyngeal features with exacerbation  The patient has not been on any medications for disease modification of the myasthenia gravis.  The patient will be placed on low-dose Decadron 2 mg daily, he has recently reported chest pain, he will be seeing his primary care physician for and evaluation.  The patient will be placed on Imuran 50 mg twice daily, it is possible he may have had an allergic reaction to the CellCept.  The patient  will undergo IVIG therapy over the next 3 or 4 months, he will follow-up here in 3 months.  He will call me if he has any problems with his medications.  Jill Alexanders MD 03/15/2018 8:11 AM  [facility] [address] [place], [place] [address]  Phone [phone] Fax [phone]

## 2018-03-16 DIAGNOSIS — M1711 Unilateral primary osteoarthritis, right knee: Secondary | ICD-10-CM | POA: Diagnosis not present

## 2018-03-16 DIAGNOSIS — S83421A Sprain of lateral collateral ligament of right knee, initial encounter: Secondary | ICD-10-CM | POA: Diagnosis not present

## 2018-03-16 DIAGNOSIS — E1165 Type 2 diabetes mellitus with hyperglycemia: Secondary | ICD-10-CM | POA: Diagnosis not present

## 2018-03-16 DIAGNOSIS — Z6825 Body mass index (BMI) 25.0-25.9, adult: Secondary | ICD-10-CM | POA: Diagnosis not present

## 2018-03-16 DIAGNOSIS — R0789 Other chest pain: Secondary | ICD-10-CM | POA: Diagnosis not present

## 2018-03-17 NOTE — Telephone Encounter (Signed)
Fax received from [employer]. Imuran 50mg  tablets approved through 01/04/19.  Member ID: [id number] (health team advantage).  No PA#/fim

## 2018-03-31 DIAGNOSIS — G7000 Myasthenia gravis without (acute) exacerbation: Secondary | ICD-10-CM | POA: Diagnosis not present

## 2018-03-31 DIAGNOSIS — E1165 Type 2 diabetes mellitus with hyperglycemia: Secondary | ICD-10-CM | POA: Diagnosis not present

## 2018-04-03 ENCOUNTER — Telehealth: Payer: Self-pay

## 2018-04-03 MED ORDER — AZATHIOPRINE 50 MG PO TABS: ORAL_TABLET | ORAL | 5 refills | Status: DC

## 2018-04-03 MED ORDER — PYRIDOSTIGMINE BROMIDE 60 MG PO TABS: ORAL_TABLET | ORAL | 1 refills | Status: DC

## 2018-04-03 MED ORDER — DEXAMETHASONE 2 MG PO TABS: 6.0000 mg | ORAL_TABLET | Freq: Every day | ORAL | 3 refills | Status: DC

## 2018-04-03 NOTE — Telephone Encounter (Signed)
Received an update from [place], [place] with intrafusion regarding Gamunex. Pt was approved for Gamunex through his insurance, but the cost for the pt was going to be too much(987 $ per infusion) . Pt did not qualify for assistance for patient assistance and government funding is not available. Pt would like to know what other options could be recommended?

## 2018-04-03 NOTE — Addendum Note (Signed)
Addended by: Kathrynn Ducking on: 04/03/2018 08:53 AM   Modules accepted: Orders

## 2018-04-03 NOTE — Telephone Encounter (Signed)
I called the patient.  The patient cannot afford his co-pay for IVIG, he is getting worse with his ability to swallow.  We will increase his Mestinon to 30 mg every 4 hours, will go up on the Decadron to 6 mg daily, and increase the Imuran to 150 mg daily.  If he is no better in 7 to 10 days, he is to contact our office, we may consider plasmapheresis, if the swallowing becomes severely impaired, he may have to go to the emergency room.  He desperately needs IVIG now.

## 2018-04-10 NOTE — Telephone Encounter (Signed)
Pt said when he takes the medication in the morning he gets shaky, nervous and weak. It does wear off later in the morning. He is wanting to discuss with Dr Jannifer Franklin.

## 2018-04-10 NOTE — Telephone Encounter (Signed)
I called the patient.  The patient getting jittery off of 6 mg of Decadron in the morning until about midday.  I will have him split the doses to take 2 mg 3 times a day with meals.  This may help some of the side effects.  The patient is having a significant elevation in his blood sugars on the Decadron to over 300.  He is adjusting his insulin doses accordingly.

## 2018-04-21 ENCOUNTER — Telehealth: Payer: Self-pay | Admitting: Neurology

## 2018-04-21 NOTE — Telephone Encounter (Signed)
Pt states that he can no longer tolerate his medication change on dexamethasone (DECADRON) 2 MG tablet and azaTHIOprine (IMURAN) 50 MG tablet. Pt states he is very jittery and weak and his stomach is not tolerating the change as well. Please advise.

## 2018-04-24 NOTE — Telephone Encounter (Signed)
This message was send on 04/21/18. I was not in the office to fwd the message to MD. Will fwd now.

## 2018-04-24 NOTE — Telephone Encounter (Signed)
I called the patient.  The patient was having trouble with blood sugars and feeling jittery on 6 mg of Decadron, he has gone to 4 mg in the morning, he remains on 150 mg of Imuran.  We tried to set him up for IVIG but he could not afford the medication.  He is doing some better with his ability to chew, he is still having some trouble with swallowing at times.  He will stay on the 4 mg the Decadron, he will let me know in about 3 to 4 weeks how he is doing, I would like to get him down even lower on Decadron if possible.  He is to remain on Imuran.

## 2018-05-03 DIAGNOSIS — G7000 Myasthenia gravis without (acute) exacerbation: Secondary | ICD-10-CM | POA: Diagnosis not present

## 2018-05-03 DIAGNOSIS — E8940 Asymptomatic postprocedural ovarian failure: Secondary | ICD-10-CM | POA: Diagnosis not present

## 2018-05-03 DIAGNOSIS — E1165 Type 2 diabetes mellitus with hyperglycemia: Secondary | ICD-10-CM | POA: Diagnosis not present

## 2018-05-03 DIAGNOSIS — I10 Essential (primary) hypertension: Secondary | ICD-10-CM | POA: Diagnosis not present

## 2018-05-08 ENCOUNTER — Telehealth: Payer: Self-pay | Admitting: Neurology

## 2018-05-08 DIAGNOSIS — Z8546 Personal history of malignant neoplasm of prostate: Secondary | ICD-10-CM

## 2018-05-08 DIAGNOSIS — R634 Abnormal weight loss: Secondary | ICD-10-CM

## 2018-05-08 DIAGNOSIS — Z5181 Encounter for therapeutic drug level monitoring: Secondary | ICD-10-CM

## 2018-05-08 MED ORDER — DEXAMETHASONE 0.5 MG PO TABS: ORAL_TABLET | ORAL | 1 refills | Status: DC

## 2018-05-08 NOTE — Telephone Encounter (Signed)
Pt states he would like a call back to discuss getting taken off his dexamethasone (DECADRON) 2 MG tablet. Please advise.

## 2018-05-08 NOTE — Telephone Encounter (Signed)
I called the patient.  The patient has felt poorly on the Decadron, he has cut back to 2 mg daily and still feels nervous and jittery, he feels hungry but he is losing weight.  His blood sugar was in the 50s today.  He has not tolerated Decadron well.  We will convert to the 0.5 mg tablets and go to 1.5 mg daily for 2 weeks and then go to 1 mg daily.  We will bring him in for blood work.

## 2018-05-11 DIAGNOSIS — E1165 Type 2 diabetes mellitus with hyperglycemia: Secondary | ICD-10-CM | POA: Diagnosis not present

## 2018-05-11 DIAGNOSIS — E890 Postprocedural hypothyroidism: Secondary | ICD-10-CM | POA: Diagnosis not present

## 2018-05-11 DIAGNOSIS — E7800 Pure hypercholesterolemia, unspecified: Secondary | ICD-10-CM | POA: Diagnosis not present

## 2018-05-11 DIAGNOSIS — I10 Essential (primary) hypertension: Secondary | ICD-10-CM | POA: Diagnosis not present

## 2018-05-22 MED ORDER — AZATHIOPRINE 50 MG PO TABS: 50.0000 mg | ORAL_TABLET | Freq: Two times a day (BID) | ORAL | 5 refills | Status: DC

## 2018-05-22 NOTE — Telephone Encounter (Signed)
Pt is asking for another call from Dr Jannifer Franklin to discuss coming off all medications because of side effects.  Please call

## 2018-05-22 NOTE — Telephone Encounter (Signed)
I called the patient.  The patient continues to have anxiety issues and weight loss, he claims that he is chewing and swallowing well, he is eating well but still losing weight.   The Decadron continues to cause high blood sugars.  We will reduce the dose to 1 mg daily for the next week and then go to 0.5 mg daily.  The patient has been on 1 mg daily dose of Decadron for about 6 days now.  He is convinced that the as a fibrin is also causing him a lot of troubles, we will reduce the dose to 50 mg twice daily.  He will contact me once he gets off of the Decadron to see how he is doing.

## 2018-05-22 NOTE — Addendum Note (Signed)
Addended by: Kathrynn Ducking on: 05/22/2018 11:00 AM   Modules accepted: Orders

## 2018-06-08 ENCOUNTER — Telehealth: Payer: Self-pay | Admitting: Neurology

## 2018-06-08 NOTE — Telephone Encounter (Signed)
Due to current COVID 19 pandemic, our office is severely reducing in office visits until further notice, in order to minimize the risk to our patients and healthcare providers.   Called patient to offer a virtual visit for 6/10 appointment. I spoke with patient's wife who declined the virtual visit as they do not have the resources to participate this way Patient accepted in office visit. I explained that we are taking necessary precautions to keep patients and staff safe by only allowing just a few patients to come back into the office. Patient understands that when he arrives he will need to pull car up to the entrance and wait for direction from a staff member. He is aware that he will have his temp taken and be asked screening questions about COVID-19.

## 2018-06-14 ENCOUNTER — Other Ambulatory Visit: Payer: Self-pay

## 2018-06-14 ENCOUNTER — Ambulatory Visit (INDEPENDENT_AMBULATORY_CARE_PROVIDER_SITE_OTHER): Payer: PPO | Admitting: Neurology

## 2018-06-14 ENCOUNTER — Encounter: Payer: Self-pay | Admitting: Neurology

## 2018-06-14 VITALS — BP 139/79 | HR 62 | Temp 97.5°F | Ht 70.0 in | Wt 170.0 lb

## 2018-06-14 DIAGNOSIS — G7000 Myasthenia gravis without (acute) exacerbation: Secondary | ICD-10-CM | POA: Diagnosis not present

## 2018-06-14 DIAGNOSIS — Z5181 Encounter for therapeutic drug level monitoring: Secondary | ICD-10-CM | POA: Diagnosis not present

## 2018-06-14 NOTE — Progress Notes (Signed)
Reason for visit: Myasthenia gravis  Mario Burns is an 72 y.o. male  History of present illness:  Mario Burns is a 73 year old right-handed white male with a history of myasthenia gravis with pharyngeal features.  The patient has a history of diabetes, he has not been able tolerate steroids whatsoever.  The steroids made him feel very poorly, he was nervous and jittery and felt weak, he was losing weight.  He has come off of the Decadron, he has remained on Imuran at a lower dose of 50 mg twice daily.  He could not tolerate CellCept as he thought that this was upsetting his stomach.  The patient has actually done quite well over the last several weeks, he no longer is having double vision or droopy eyelids, his speech and swallowing have normalized, he does not have problems with chewing or drooling.  The patient has good strength of the arms and legs.  He is getting better with control of his blood sugars off of the steroids.  The patient returns for an evaluation.  Past Medical History:  Diagnosis Date  . Arthritis   . Bladder neck contracture   . Borderline hyperlipidemia   . Claustrophobia   . Complication of anesthesia    CLAUSTROPHOBIC  . GERD (gastroesophageal reflux disease)   . H/O hiatal hernia   . History of prostate cancer    S/P PROSTATECTOMY  . History of thyroid nodule    LARGE GOITER  . Hypertension   . Hypothyroidism, postsurgical   . MG (myasthenia gravis) ([place])    DX 2013 --  NEUROLOGIST--  DR Mario Burns/  TREATED W/ MEDICATION UNTIL SYMPTOMS RESOLVED -- NO REMISSION--  SEES DR Mario Burns PRN  . OSA (obstructive sleep apnea)    NON-COMPLIANT CPAP--  CLAUSTROPHOBIC  . Shortness of breath    ALWAYS--HAD FOR YEARS - STATES HE CAN'T DO HARDLY ANYTHING WITHOUT GIVING OUT  . Type 2 diabetes mellitus ([place])   . Weak urinary stream     Past Surgical History:  Procedure Laterality Date  . CYSTOSCOPY N/A 07/30/2013   Procedure: CYSTOSCOPY WITH Mario Burns ;   Surgeon: Mario Amass, MD;  Location: [facility];  Service: Urology;  Laterality: N/A;  . CYSTOSCOPY WITH URETHRAL DILATATION N/A 08/02/2012   Procedure: CYSTOSCOPY WITH URETHRAL DILATATION, AN REMOVAL STAPLE FROM BLADDER NECK;  Surgeon: Mario Amass, MD;  Location: [facility];  Service: Urology;  Laterality: N/A;  . HEMORRHOID SURGERY  1990's  . INCISIONAL HERNIA REPAIR N/A 04/23/2013   Procedure: LAPAROSCOPIC INCISIONAL HERNIA;  Surgeon: Mario Hector, MD;  Location: [facility];  Service: General;  Laterality: N/A;  . INSERTION OF MESH N/A 04/23/2013   Procedure: INSERTION OF MESH;  Surgeon: Mario Hector, MD;  Location: [facility];  Service: General;  Laterality: N/A;  . ROBOT ASSISTED LAPAROSCOPIC RADICAL PROSTATECTOMY  12/15/2011   Procedure: ROBOTIC ASSISTED LAPAROSCOPIC RADICAL PROSTATECTOMY;  Surgeon: Mario Amass, MD;  Location: [facility];  Service: Urology;  Laterality: N/A;  . TOTAL THYROIDECTOMY  05-24-2003    Family History  Problem Relation Age of Onset  . Heart disease Mother   . Glaucoma Mother   . Hypertension Mother   . Colon cancer Father   . Diabetes Sister   . Diabetes Brother   . Diabetes Brother   . Cancer Brother     Social history:  reports that he has never smoked. He has never used smokeless tobacco. He reports that  he does not drink alcohol or use drugs.    Allergies  Allergen Reactions  . Sulfa Antibiotics Other (See Comments)    unknown    Medications:  Prior to Admission medications   Medication Sig Start Date End Date Taking? Authorizing Provider  azaTHIOprine (IMURAN) 50 MG tablet Take 1 tablet (50 mg total) by mouth 2 (two) times daily. 05/22/18  Yes Mario Ducking, MD  glimepiride (AMARYL) 4 MG tablet Take 4 mg by mouth 2 (two) times daily.   Yes [provider]  Levothyroxine Sodium 150 MCG CAPS Take 150 mcg by mouth every morning.    Yes [provider]  losartan (COZAAR) 50 MG tablet Take  50 mg by mouth daily.   Yes [provider]  naproxen sodium (ANAPROX) 220 MG tablet Take 440 mg by mouth every morning.    Yes [provider]  omeprazole (PRILOSEC) 20 MG capsule Take 20 mg by mouth every evening.    Yes [provider]  ONE TOUCH ULTRA TEST test strip  03/23/16  Yes [provider]  Jonetta Speak LANCETS [address] [place]  03/23/16  Yes [provider]  pyridostigmine (MESTINON) 60 MG tablet 1/2 tablet every 4 hours while awake 04/03/18  Yes Mario Ducking, MD  TOUJEO MAX SOLOSTAR 300 UNIT/ML SOPN Reports he is taking 18 units in the evening 04/22/18  Yes [provider]    ROS:  Out of a complete 14 system review of symptoms, the patient complains only of the following symptoms, and all other reviewed systems are negative.  Mild walking problems Shoulder pain  Blood pressure 139/79, pulse 62, temperature (!) 97.5 F (36.4 C), temperature source Oral, height 5\' 10"  (1.778 m), weight 170 lb (77.1 kg).  Physical Exam  General: The patient is alert and cooperative at the time of the examination.  Skin: No significant peripheral edema is noted.   Neurologic Exam  Mental status: The patient is alert and oriented x 3 at the time of the examination. The patient has apparent normal recent and remote memory, with an apparently normal attention span and concentration ability.   Cranial nerves: Facial symmetry is present. Speech is normal, no aphasia or dysarthria is noted. Extraocular movements are full. Visual fields are full.  With the eyes deviated superiorly for 1 minute, no evidence of divergence of gaze or ptosis was seen.  The patient did not report any subjective double vision.  Motor: The patient has good strength in all 4 extremities.  With arms outstretched for 1 minute, no fatigable weakness of the deltoid muscles was noted.  Sensory examination: Soft touch sensation is symmetric on the face, arms, and legs.   Coordination: The patient has good finger-nose-finger and heel-to-shin bilaterally.  Gait and station: The patient has a normal gait. Tandem gait is normal. Romberg is negative. No drift is seen.  Reflexes: Deep tendon reflexes are symmetric.   Assessment/Plan:  1.  Myasthenia gravis  The patient is doing much better with his weakness.  He is on Imuran and Mestinon only, he is off the steroids.  He did not tolerate steroids well at all.  The patient will remain on 50 mg twice daily of the Imuran, we will check blood work today.  He will follow-up in 6 months.  Jill Alexanders MD 06/14/2018 9:34 AM  [facility] [address] [place], [place] [address]  Phone [phone] Fax [phone]

## 2018-06-15 ENCOUNTER — Telehealth: Payer: Self-pay

## 2018-06-15 LAB — CBC WITH DIFFERENTIAL/PLATELET
Basophils Absolute: 0.1 10*3/uL (ref 0.0–0.2)
Basos: 1 %
EOS (ABSOLUTE): 0.1 10*3/uL (ref 0.0–0.4)
Eos: 2 %
Hematocrit: 40.1 % (ref 37.5–51.0)
Hemoglobin: 13.7 g/dL (ref 13.0–17.7)
Immature Grans (Abs): 0 10*3/uL (ref 0.0–0.1)
Immature Granulocytes: 1 %
Lymphocytes Absolute: 2.2 10*3/uL (ref 0.7–3.1)
Lymphs: 39 %
MCH: 31.2 pg (ref 26.6–33.0)
MCHC: 34.2 g/dL (ref 31.5–35.7)
MCV: 91 fL (ref 79–97)
Monocytes Absolute: 0.5 10*3/uL (ref 0.1–0.9)
Monocytes: 9 %
Neutrophils Absolute: 2.7 10*3/uL (ref 1.4–7.0)
Neutrophils: 48 %
Platelets: 255 10*3/uL (ref 150–450)
RBC: 4.39 x10E6/uL (ref 4.14–5.80)
RDW: 13.9 % (ref 11.6–15.4)
WBC: 5.5 10*3/uL (ref 3.4–10.8)

## 2018-06-15 LAB — COMPREHENSIVE METABOLIC PANEL
ALT: 27 IU/L (ref 0–44)
AST: 21 IU/L (ref 0–40)
Albumin/Globulin Ratio: 1.5 (ref 1.2–2.2)
Albumin: 3.5 g/dL — ABNORMAL LOW (ref 3.7–4.7)
Alkaline Phosphatase: 86 IU/L (ref 39–117)
BUN/Creatinine Ratio: 33 — ABNORMAL HIGH (ref 10–24)
BUN: 24 mg/dL (ref 8–27)
Bilirubin Total: 0.6 mg/dL (ref 0.0–1.2)
CO2: 21 mmol/L (ref 20–29)
Calcium: 9 mg/dL (ref 8.6–10.2)
Chloride: 104 mmol/L (ref 96–106)
Creatinine, Ser: 0.72 mg/dL — ABNORMAL LOW (ref 0.76–1.27)
GFR calc Af Amer: 107 mL/min/{1.73_m2} (ref >59)
GFR calc non Af Amer: 93 mL/min/{1.73_m2} (ref >59)
Globulin, Total: 2.3 g/dL (ref 1.5–4.5)
Glucose: 139 mg/dL — ABNORMAL HIGH (ref 65–99)
Potassium: 4.2 mmol/L (ref 3.5–5.2)
Sodium: 139 mmol/L (ref 134–144)
Total Protein: 5.8 g/dL — ABNORMAL LOW (ref 6.0–8.5)

## 2018-06-15 NOTE — Telephone Encounter (Signed)
I reached out the pt's wife Tamela Oddi ok per dpr) She stated the pt was currently on the phone and she could take the message. I advised upon labs and she voiced understanding and appreciation for the call.  She had no questions and will advise pt of results.

## 2018-06-15 NOTE — Telephone Encounter (Signed)
-----   Message from Kathrynn Ducking, MD sent at 06/15/2018 10:01 AM EDT ----- Blood work is unremarkable exception of a slightly low total protein and albumin level, CBC is unremarkable, remain on azathioprine.  Please call the patient. ----- Message ----- From: Lavone Neri Lab Results In Sent: 06/15/2018   7:37 AM EDT To: Kathrynn Ducking, MD

## 2018-07-05 DIAGNOSIS — Z6827 Body mass index (BMI) 27.0-27.9, adult: Secondary | ICD-10-CM | POA: Diagnosis not present

## 2018-07-05 DIAGNOSIS — R609 Edema, unspecified: Secondary | ICD-10-CM | POA: Diagnosis not present

## 2018-07-05 DIAGNOSIS — G7000 Myasthenia gravis without (acute) exacerbation: Secondary | ICD-10-CM | POA: Diagnosis not present

## 2018-07-05 DIAGNOSIS — Z139 Encounter for screening, unspecified: Secondary | ICD-10-CM | POA: Diagnosis not present

## 2018-07-05 DIAGNOSIS — E1165 Type 2 diabetes mellitus with hyperglycemia: Secondary | ICD-10-CM | POA: Diagnosis not present

## 2018-07-05 DIAGNOSIS — E890 Postprocedural hypothyroidism: Secondary | ICD-10-CM | POA: Diagnosis not present

## 2018-07-05 DIAGNOSIS — I10 Essential (primary) hypertension: Secondary | ICD-10-CM | POA: Diagnosis not present

## 2018-07-13 DIAGNOSIS — E113293 Type 2 diabetes mellitus with mild nonproliferative diabetic retinopathy without macular edema, bilateral: Secondary | ICD-10-CM | POA: Diagnosis not present

## 2018-07-13 DIAGNOSIS — H2513 Age-related nuclear cataract, bilateral: Secondary | ICD-10-CM | POA: Diagnosis not present

## 2018-08-25 DIAGNOSIS — H2512 Age-related nuclear cataract, left eye: Secondary | ICD-10-CM | POA: Diagnosis not present

## 2018-08-25 DIAGNOSIS — Z01818 Encounter for other preprocedural examination: Secondary | ICD-10-CM | POA: Diagnosis not present

## 2018-09-12 DIAGNOSIS — E785 Hyperlipidemia, unspecified: Secondary | ICD-10-CM | POA: Diagnosis not present

## 2018-09-12 DIAGNOSIS — G4730 Sleep apnea, unspecified: Secondary | ICD-10-CM | POA: Diagnosis not present

## 2018-09-12 DIAGNOSIS — E119 Type 2 diabetes mellitus without complications: Secondary | ICD-10-CM | POA: Diagnosis not present

## 2018-09-12 DIAGNOSIS — H2512 Age-related nuclear cataract, left eye: Secondary | ICD-10-CM | POA: Diagnosis not present

## 2018-09-12 DIAGNOSIS — Z79899 Other long term (current) drug therapy: Secondary | ICD-10-CM | POA: Diagnosis not present

## 2018-09-12 DIAGNOSIS — Z794 Long term (current) use of insulin: Secondary | ICD-10-CM | POA: Diagnosis not present

## 2018-09-12 DIAGNOSIS — E039 Hypothyroidism, unspecified: Secondary | ICD-10-CM | POA: Diagnosis not present

## 2018-09-12 DIAGNOSIS — E7800 Pure hypercholesterolemia, unspecified: Secondary | ICD-10-CM | POA: Diagnosis not present

## 2018-09-12 DIAGNOSIS — G4733 Obstructive sleep apnea (adult) (pediatric): Secondary | ICD-10-CM | POA: Diagnosis not present

## 2018-09-12 DIAGNOSIS — E1136 Type 2 diabetes mellitus with diabetic cataract: Secondary | ICD-10-CM | POA: Diagnosis not present

## 2018-09-12 DIAGNOSIS — K219 Gastro-esophageal reflux disease without esophagitis: Secondary | ICD-10-CM | POA: Diagnosis not present

## 2018-09-12 DIAGNOSIS — H259 Unspecified age-related cataract: Secondary | ICD-10-CM | POA: Diagnosis not present

## 2018-09-12 DIAGNOSIS — I10 Essential (primary) hypertension: Secondary | ICD-10-CM | POA: Diagnosis not present

## 2018-09-12 DIAGNOSIS — Z7984 Long term (current) use of oral hypoglycemic drugs: Secondary | ICD-10-CM | POA: Diagnosis not present

## 2018-09-13 ENCOUNTER — Telehealth: Payer: Self-pay | Admitting: Neurology

## 2018-09-13 DIAGNOSIS — Z5181 Encounter for therapeutic drug level monitoring: Secondary | ICD-10-CM

## 2018-09-13 NOTE — Telephone Encounter (Signed)
I called the patient.  He is come in at some point for blood work on CellCept.  I will put the orders in.

## 2018-09-14 ENCOUNTER — Other Ambulatory Visit (INDEPENDENT_AMBULATORY_CARE_PROVIDER_SITE_OTHER): Payer: Self-pay

## 2018-09-14 ENCOUNTER — Other Ambulatory Visit: Payer: Self-pay

## 2018-09-14 DIAGNOSIS — Z5181 Encounter for therapeutic drug level monitoring: Secondary | ICD-10-CM | POA: Diagnosis not present

## 2018-09-14 DIAGNOSIS — R634 Abnormal weight loss: Secondary | ICD-10-CM | POA: Diagnosis not present

## 2018-09-14 DIAGNOSIS — Z8546 Personal history of malignant neoplasm of prostate: Secondary | ICD-10-CM | POA: Diagnosis not present

## 2018-09-14 DIAGNOSIS — Z0289 Encounter for other administrative examinations: Secondary | ICD-10-CM

## 2018-09-15 LAB — CBC WITH DIFFERENTIAL/PLATELET
Basophils Absolute: 0 10*3/uL (ref 0.0–0.2)
Basos: 1 %
EOS (ABSOLUTE): 0.2 10*3/uL (ref 0.0–0.4)
Eos: 4 %
Hematocrit: 41 % (ref 37.5–51.0)
Hemoglobin: 14 g/dL (ref 13.0–17.7)
Immature Grans (Abs): 0 10*3/uL (ref 0.0–0.1)
Immature Granulocytes: 0 %
Lymphocytes Absolute: 2 10*3/uL (ref 0.7–3.1)
Lymphs: 36 %
MCH: 31.1 pg (ref 26.6–33.0)
MCHC: 34.1 g/dL (ref 31.5–35.7)
MCV: 91 fL (ref 79–97)
Monocytes Absolute: 0.6 10*3/uL (ref 0.1–0.9)
Monocytes: 10 %
Neutrophils Absolute: 2.8 10*3/uL (ref 1.4–7.0)
Neutrophils: 49 %
Platelets: 229 10*3/uL (ref 150–450)
RBC: 4.5 x10E6/uL (ref 4.14–5.80)
RDW: 12.3 % (ref 11.6–15.4)
WBC: 5.6 10*3/uL (ref 3.4–10.8)

## 2018-09-15 LAB — COMPREHENSIVE METABOLIC PANEL
ALT: 22 IU/L (ref 0–44)
AST: 18 IU/L (ref 0–40)
Albumin/Globulin Ratio: 1.7 (ref 1.2–2.2)
Albumin: 3.8 g/dL (ref 3.7–4.7)
Alkaline Phosphatase: 87 IU/L (ref 39–117)
BUN/Creatinine Ratio: 24 (ref 10–24)
BUN: 20 mg/dL (ref 8–27)
Bilirubin Total: 0.4 mg/dL (ref 0.0–1.2)
CO2: 23 mmol/L (ref 20–29)
Calcium: 9.2 mg/dL (ref 8.6–10.2)
Chloride: 104 mmol/L (ref 96–106)
Creatinine, Ser: 0.83 mg/dL (ref 0.76–1.27)
GFR calc Af Amer: 101 mL/min/{1.73_m2} (ref >59)
GFR calc non Af Amer: 87 mL/min/{1.73_m2} (ref >59)
Globulin, Total: 2.3 g/dL (ref 1.5–4.5)
Glucose: 161 mg/dL — ABNORMAL HIGH (ref 65–99)
Potassium: 4.4 mmol/L (ref 3.5–5.2)
Sodium: 138 mmol/L (ref 134–144)
Total Protein: 6.1 g/dL (ref 6.0–8.5)

## 2018-09-15 LAB — PSA: Prostate Specific Ag, Serum: <0.1 ng/mL (ref 0.0–4.0)

## 2018-09-15 LAB — TSH: TSH: 0.589 u[IU]/mL (ref 0.450–4.500)

## 2018-09-19 ENCOUNTER — Telehealth: Payer: Self-pay

## 2018-09-19 NOTE — Telephone Encounter (Signed)
Pt's wife Tamela Oddi ok per dpr) and we were able to discuss lab results. She verbalized understanding.

## 2018-09-19 NOTE — Telephone Encounter (Signed)
-----   Message from Kathrynn Ducking, MD sent at 09/15/2018  8:05 AM EDT ----- The blood work results are unremarkable.  The random blood sugar level was slightly elevated.  Please call the patient. ----- Message ----- From: Lavone Neri Lab Results In Sent: 09/15/2018   5:38 AM EDT To: Kathrynn Ducking, MD

## 2018-09-19 NOTE — Telephone Encounter (Signed)
Lvm asking pt to call me back.  

## 2018-10-09 DIAGNOSIS — E1165 Type 2 diabetes mellitus with hyperglycemia: Secondary | ICD-10-CM | POA: Diagnosis not present

## 2018-10-09 DIAGNOSIS — Z6827 Body mass index (BMI) 27.0-27.9, adult: Secondary | ICD-10-CM | POA: Diagnosis not present

## 2018-10-09 DIAGNOSIS — E7800 Pure hypercholesterolemia, unspecified: Secondary | ICD-10-CM | POA: Diagnosis not present

## 2018-10-09 DIAGNOSIS — E890 Postprocedural hypothyroidism: Secondary | ICD-10-CM | POA: Diagnosis not present

## 2018-10-09 DIAGNOSIS — Z2821 Immunization not carried out because of patient refusal: Secondary | ICD-10-CM | POA: Diagnosis not present

## 2018-10-09 DIAGNOSIS — I10 Essential (primary) hypertension: Secondary | ICD-10-CM | POA: Diagnosis not present

## 2018-10-09 DIAGNOSIS — G7000 Myasthenia gravis without (acute) exacerbation: Secondary | ICD-10-CM | POA: Diagnosis not present

## 2018-11-04 ENCOUNTER — Other Ambulatory Visit: Payer: Self-pay | Admitting: Neurology

## 2018-11-14 DIAGNOSIS — H259 Unspecified age-related cataract: Secondary | ICD-10-CM | POA: Diagnosis not present

## 2018-11-14 DIAGNOSIS — Z961 Presence of intraocular lens: Secondary | ICD-10-CM | POA: Diagnosis not present

## 2018-11-14 DIAGNOSIS — E1136 Type 2 diabetes mellitus with diabetic cataract: Secondary | ICD-10-CM | POA: Diagnosis not present

## 2018-11-14 DIAGNOSIS — K219 Gastro-esophageal reflux disease without esophagitis: Secondary | ICD-10-CM | POA: Diagnosis not present

## 2018-11-14 DIAGNOSIS — E113293 Type 2 diabetes mellitus with mild nonproliferative diabetic retinopathy without macular edema, bilateral: Secondary | ICD-10-CM | POA: Diagnosis not present

## 2018-11-14 DIAGNOSIS — E785 Hyperlipidemia, unspecified: Secondary | ICD-10-CM | POA: Diagnosis not present

## 2018-11-14 DIAGNOSIS — E039 Hypothyroidism, unspecified: Secondary | ICD-10-CM | POA: Diagnosis not present

## 2018-11-14 DIAGNOSIS — I10 Essential (primary) hypertension: Secondary | ICD-10-CM | POA: Diagnosis not present

## 2018-11-14 DIAGNOSIS — H2511 Age-related nuclear cataract, right eye: Secondary | ICD-10-CM | POA: Diagnosis not present

## 2018-11-14 DIAGNOSIS — Z79899 Other long term (current) drug therapy: Secondary | ICD-10-CM | POA: Diagnosis not present

## 2018-11-14 HISTORY — PX: CATARACT EXTRACTION W/ INTRAOCULAR LENS IMPLANT: SHX1309

## 2018-12-13 NOTE — Progress Notes (Addendum)
PATIENT: Mario Burns DOB: 05/19/1945  REASON FOR VISIT: follow up HISTORY FROM: patient  HISTORY OF PRESENT ILLNESS: Today 12/14/18  Mario Burns is a 73 year old male with history of myasthenia gravis and pharyngeal features.  He has history of diabetes and has not been able to tolerate steroids.  He remains on Imuran 50 mg in the Burns 100 mg in the evening.  He cannot tolerate CellCept.  He is taking Mestinon 60 mg tablet, 1/2 tablet 3 times a day after each meal.  He reports for the last 3 weeks, he feels a sensation that biting is more difficult and weaker.  He denies any issues swallowing, but he is careful, and eats his food slowly. He had left eye cataract surgery in September, the right eye in November.  He wishes he never had the surgery, reports his left eye feels full. He has been told the eye looks normal post surgery.  He denies any significant weakness of his arms or legs.  He denies ptosis or diplopia. He has remained active, up until his cataract surgery.  He drives a car. He presents today for follow-up unaccompanied.   HISTORY 06/14/2018 Dr. Jannifer Franklin: Mario Burns is a 73 year old right-handed white male with a history of myasthenia gravis with pharyngeal features.  The patient has a history of diabetes, he has not been able tolerate steroids whatsoever.  The steroids made him feel very poorly, he was nervous and jittery and felt weak, he was losing weight.  He has come off of the Decadron, he has remained on Imuran at a lower dose of 50 mg twice daily.  He could not tolerate CellCept as he thought that this was upsetting his stomach.  The patient has actually done quite well over the last several weeks, he no longer is having double vision or droopy eyelids, his speech and swallowing have normalized, he does not have problems with chewing or drooling.  The patient has good strength of the arms and legs.  He is getting better with control of his blood sugars off of the steroids.   The patient returns for an evaluation.  REVIEW OF SYSTEMS: Out of a complete 14 system review of symptoms, the patient complains only of the following symptoms, and all other reviewed systems are negative.  Weakness   ALLERGIES: Allergies  Allergen Reactions   Sulfa Antibiotics Other (See Comments)    unknown    HOME MEDICATIONS: Outpatient Medications Prior to Visit  Medication Sig Dispense Refill   azaTHIOprine (IMURAN) 50 MG tablet TAKE 1 TABLET BY MOUTH IN THE Burns AND 2 IN THE EVENING. 90 tablet 0   glimepiride (AMARYL) 4 MG tablet Take 4 mg by mouth 2 (two) times daily.     Levothyroxine Sodium 150 MCG CAPS Take 150 mcg by mouth every Burns.      losartan (COZAAR) 50 MG tablet Take 50 mg by mouth daily.     omeprazole (PRILOSEC) 20 MG capsule Take 20 mg by mouth every evening.      ONE TOUCH ULTRA TEST test strip      ONETOUCH DELICA LANCETS [id number] MISC      TOUJEO MAX SOLOSTAR 300 UNIT/ML SOPN Reports he is taking 18 units in the evening     naproxen sodium (ANAPROX) 220 MG tablet Take 440 mg by mouth every Burns.      pyridostigmine (MESTINON) 60 MG tablet 1/2 tablet every 4 hours while awake (Patient not taking: Reported on 12/14/2018) 180 tablet 1  No facility-administered medications prior to visit.    PAST MEDICAL HISTORY: Past Medical History:  Diagnosis Date   Arthritis    Bladder neck contracture    Borderline hyperlipidemia    Claustrophobia    Complication of anesthesia    CLAUSTROPHOBIC   GERD (gastroesophageal reflux disease)    H/O hiatal hernia    History of prostate cancer    S/P PROSTATECTOMY   History of thyroid nodule    LARGE GOITER   Hypertension    Hypothyroidism, postsurgical    MG (myasthenia gravis) ([place])    DX 2013 --  NEUROLOGIST--  DR WILLIS/  TREATED W/ MEDICATION UNTIL SYMPTOMS RESOLVED -- NO REMISSION--  SEES DR WILLIS PRN   OSA (obstructive sleep apnea)    NON-COMPLIANT CPAP--  CLAUSTROPHOBIC    Shortness of breath    ALWAYS--HAD FOR YEARS - STATES HE CAN'T DO HARDLY ANYTHING WITHOUT GIVING OUT   Type 2 diabetes mellitus ([facility])    Weak urinary stream     PAST SURGICAL HISTORY: Past Surgical History:  Procedure Laterality Date   CYSTOSCOPY N/A 07/30/2013   Procedure: CYSTOSCOPY WITH Anastasio Champion ;  Surgeon: Bernestine Amass, MD;  Location: [facility];  Service: Urology;  Laterality: N/A;   CYSTOSCOPY WITH URETHRAL DILATATION N/A 08/02/2012   Procedure: CYSTOSCOPY WITH URETHRAL DILATATION, AN REMOVAL STAPLE FROM BLADDER NECK;  Surgeon: Bernestine Amass, MD;  Location: [facility];  Service: Urology;  Laterality: N/A;   HEMORRHOID SURGERY  1990's   INCISIONAL HERNIA REPAIR N/A 04/23/2013   Procedure: LAPAROSCOPIC INCISIONAL HERNIA;  Surgeon: [name], MD;  Location: [facility];  Service: General;  Laterality: N/A;   INSERTION OF MESH N/A 04/23/2013   Procedure: INSERTION OF MESH;  Surgeon: [name], MD;  Location: [facility];  Service: General;  Laterality: N/A;   ROBOT ASSISTED LAPAROSCOPIC RADICAL PROSTATECTOMY  12/15/2011   Procedure: ROBOTIC ASSISTED LAPAROSCOPIC RADICAL PROSTATECTOMY;  Surgeon: Bernestine Amass, MD;  Location: [facility];  Service: Urology;  Laterality: N/A;   TOTAL THYROIDECTOMY  05-24-2003    FAMILY HISTORY: Family History  Problem Relation Age of Onset   Heart disease Mother    Glaucoma Mother    Hypertension Mother    Colon cancer Father    Diabetes Sister    Diabetes Brother    Diabetes Brother    Cancer Brother     SOCIAL HISTORY: Social History   Socioeconomic History   Marital status: Married    Spouse name: Not on file   Number of children: 1   Years of education: 10th   Highest education level: Not on file  Occupational History   Occupation: semi-retired  Tobacco Use   Smoking status: Never Smoker   Smokeless tobacco: Never Used  Substance and Sexual Activity   Alcohol use: No    Drug use: No   Sexual activity: Not on file  Other Topics Concern   Not on file  Social History Narrative   Lives at home with his wife.   Right-handed.   2-3 cups caffeine per day.   Social Determinants of Health   Financial Resource Strain:    Difficulty of Paying Living Expenses: Not on file  Food Insecurity:    Worried About Charity fundraiser in the Last Year: Not on file   [employer] of Food in the Last Year: Not on file  Transportation Needs:    Lack of Transportation (Medical): Not on file  Lack of Transportation (Non-Medical): Not on file  Physical Activity:    Days of Exercise per Week: Not on file   Minutes of Exercise per Session: Not on file  Stress:    Feeling of Stress : Not on file  Social Connections:    Frequency of Communication with Friends and Family: Not on file   Frequency of Social Gatherings with Friends and Family: Not on file   Attends Religious Services: Not on file   Active Member of Clubs or Organizations: Not on file   Attends Archivist Meetings: Not on file   Marital Status: Not on file  Intimate Partner Violence:    Fear of Current or Ex-Partner: Not on file   Emotionally Abused: Not on file   Physically Abused: Not on file   Sexually Abused: Not on file    PHYSICAL EXAM  Vitals:   12/14/18 0912  BP: 126/76  Pulse: 60  Temp: (!) 97.3 F (36.3 C)  Weight: 179 lb 3.2 oz (81.3 kg)  Height: 5\' 9"  (1.753 m)   Body mass index is 26.46 kg/m.  Generalized: Well developed, in no acute distress   Neurological examination  Mentation: Alert oriented to time, place, history taking. Follows all commands speech and language fluent Cranial nerve II-XII: Pupils were equal round reactive to light. Extraocular movements were full, visual field were full on confrontational test. Facial sensation and strength were normal. Head turning and shoulder shrug were normal and symmetric. No puff weakness noted.  With eyes  deviated superiorly for 1 minute, no evidence of ptosis or subjective diplopia. Motor: The motor testing reveals 5 over 5 strength of all 4 extremities. Good symmetric motor tone is noted throughout.  With arms outstretched for 1 minute, no fatigable weakness of the deltoid muscle. Sensory: Sensory testing is intact to soft touch on all 4 extremities. No evidence of extinction is noted.  Coordination: Cerebellar testing reveals good finger-nose-finger and heel-to-shin bilaterally.  Gait and station: Gait is normal. Tandem gait is unsteady. Reflexes: Deep tendon reflexes are symmetric and normal bilaterally.   DIAGNOSTIC DATA (LABS, IMAGING, TESTING) - I reviewed patient records, labs, notes, testing and imaging myself where available.  Lab Results  Component Value Date   WBC 5.6 09/14/2018   HGB 14.0 09/14/2018   HCT 41.0 09/14/2018   MCV 91 09/14/2018   PLT 229 09/14/2018      Component Value Date/Time   NA 138 09/14/2018 0920   K 4.4 09/14/2018 0920   CL 104 09/14/2018 0920   CO2 23 09/14/2018 0920   GLUCOSE 161 (H) 09/14/2018 0920   GLUCOSE 205 (H) 07/30/2013 0928   BUN 20 09/14/2018 0920   CREATININE 0.83 09/14/2018 0920   CALCIUM 9.2 09/14/2018 0920   PROT 6.1 09/14/2018 0920   ALBUMIN 3.8 09/14/2018 0920   AST 18 09/14/2018 0920   ALT 22 09/14/2018 0920   ALKPHOS 87 09/14/2018 0920   BILITOT 0.4 09/14/2018 0920   GFRNONAA 87 09/14/2018 0920   GFRAA 101 09/14/2018 0920   No results found for: CHOL, HDL, LDLCALC, LDLDIRECT, TRIG, CHOLHDL Lab Results  Component Value Date   HGBA1C 10.3 (A) 05/31/2014   No results found for: VITAMINB12 Lab Results  Component Value Date   TSH 0.589 09/14/2018   ASSESSMENT AND PLAN 73 y.o. year old male  has a past medical history of Arthritis, Bladder neck contracture, Borderline hyperlipidemia, Claustrophobia, Complication of anesthesia, GERD (gastroesophageal reflux disease), H/O hiatal hernia, History of prostate cancer,  History  of thyroid nodule, Hypertension, Hypothyroidism, postsurgical, MG (myasthenia gravis) ([place]), OSA (obstructive sleep apnea), Shortness of breath, Type 2 diabetes mellitus ([place]), and Weak urinary stream. here with:  1.  Myasthenia gravis  For the last 3 weeks he has reported some mild, notable weakness when biting into food, no trouble swallowing or choking. I will check routine lab work, if the MCV allows, I increase the Imuran to 100 mg twice a day. The MCV has been within normal range previously 91 (normal 79-97). He will continue taking Mestinon 60 mg tablet, half tablet 3 times a day, after meals.  He has been unable to tolerate steroids previously, CellCept made his stomach upset. He will follow-up in 4 months with Dr. Jannifer Franklin or sooner if needed.  I did advise if his symptoms worsen or if he develops any new symptoms he should let us know.  I spent 15 minutes with the patient. 50% of this time was spent discussing his plan of care.  Butler Denmark, AGNP-C, DNP 12/14/2018, 9:18 AM [facility] [address], [place], [place] [address] [phone]  I have read the note, and I agree with the clinical assessment and plan.  Kathrynn Ducking

## 2018-12-14 ENCOUNTER — Encounter: Payer: Self-pay | Admitting: Neurology

## 2018-12-14 ENCOUNTER — Ambulatory Visit (INDEPENDENT_AMBULATORY_CARE_PROVIDER_SITE_OTHER): Payer: PPO | Admitting: Neurology

## 2018-12-14 ENCOUNTER — Other Ambulatory Visit: Payer: Self-pay

## 2018-12-14 VITALS — BP 126/76 | HR 60 | Temp 97.3°F | Ht 69.0 in | Wt 179.2 lb

## 2018-12-14 DIAGNOSIS — G7000 Myasthenia gravis without (acute) exacerbation: Secondary | ICD-10-CM

## 2018-12-14 NOTE — Patient Instructions (Signed)
Continue current medications   I will check lab work today   Will call if we need to make any changes   Call for new or worsening symptoms   Follow-up in 4 months or sooner if needed with Dr. Jannifer Franklin

## 2018-12-15 LAB — COMPREHENSIVE METABOLIC PANEL
ALT: 20 IU/L (ref 0–44)
AST: 17 IU/L (ref 0–40)
Albumin/Globulin Ratio: 1.9 (ref 1.2–2.2)
Albumin: 4.1 g/dL (ref 3.7–4.7)
Alkaline Phosphatase: 86 IU/L (ref 39–117)
BUN/Creatinine Ratio: 21 (ref 10–24)
BUN: 17 mg/dL (ref 8–27)
Bilirubin Total: 0.9 mg/dL (ref 0.0–1.2)
CO2: 22 mmol/L (ref 20–29)
Calcium: 9.3 mg/dL (ref 8.6–10.2)
Chloride: 104 mmol/L (ref 96–106)
Creatinine, Ser: 0.81 mg/dL (ref 0.76–1.27)
GFR calc Af Amer: 102 mL/min/{1.73_m2} (ref >59)
GFR calc non Af Amer: 88 mL/min/{1.73_m2} (ref >59)
Globulin, Total: 2.2 g/dL (ref 1.5–4.5)
Glucose: 170 mg/dL — ABNORMAL HIGH (ref 65–99)
Potassium: 4.4 mmol/L (ref 3.5–5.2)
Sodium: 136 mmol/L (ref 134–144)
Total Protein: 6.3 g/dL (ref 6.0–8.5)

## 2018-12-15 LAB — CBC WITH DIFFERENTIAL/PLATELET
Basophils Absolute: 0 10*3/uL (ref 0.0–0.2)
Basos: 1 %
EOS (ABSOLUTE): 0.1 10*3/uL (ref 0.0–0.4)
Eos: 2 %
Hematocrit: 41.8 % (ref 37.5–51.0)
Hemoglobin: 14 g/dL (ref 13.0–17.7)
Immature Grans (Abs): 0 10*3/uL (ref 0.0–0.1)
Immature Granulocytes: 0 %
Lymphocytes Absolute: 2 10*3/uL (ref 0.7–3.1)
Lymphs: 37 %
MCH: 31 pg (ref 26.6–33.0)
MCHC: 33.5 g/dL (ref 31.5–35.7)
MCV: 93 fL (ref 79–97)
Monocytes Absolute: 0.6 10*3/uL (ref 0.1–0.9)
Monocytes: 11 %
Neutrophils Absolute: 2.8 10*3/uL (ref 1.4–7.0)
Neutrophils: 49 %
Platelets: 252 10*3/uL (ref 150–450)
RBC: 4.51 x10E6/uL (ref 4.14–5.80)
RDW: 13.3 % (ref 11.6–15.4)
WBC: 5.6 10*3/uL (ref 3.4–10.8)

## 2018-12-18 ENCOUNTER — Telehealth: Payer: Self-pay | Admitting: Neurology

## 2018-12-18 MED ORDER — AZATHIOPRINE 50 MG PO TABS: ORAL_TABLET | ORAL | 1 refills | Status: DC

## 2018-12-18 NOTE — Telephone Encounter (Signed)
I called the patient.  Laboratory evaluation is unremarkable, except glucose 170.  I will increase Imuran to 100 mg twice a day.  He will have lab work done in 2 months to ensure tolerability of the Imuran increase.

## 2018-12-28 DIAGNOSIS — Z20828 Contact with and (suspected) exposure to other viral communicable diseases: Secondary | ICD-10-CM | POA: Diagnosis not present

## 2019-01-12 DIAGNOSIS — I10 Essential (primary) hypertension: Secondary | ICD-10-CM | POA: Diagnosis not present

## 2019-01-12 DIAGNOSIS — E1165 Type 2 diabetes mellitus with hyperglycemia: Secondary | ICD-10-CM | POA: Diagnosis not present

## 2019-01-12 DIAGNOSIS — Z79899 Other long term (current) drug therapy: Secondary | ICD-10-CM | POA: Diagnosis not present

## 2019-01-12 DIAGNOSIS — E890 Postprocedural hypothyroidism: Secondary | ICD-10-CM | POA: Diagnosis not present

## 2019-01-12 DIAGNOSIS — C61 Malignant neoplasm of prostate: Secondary | ICD-10-CM | POA: Diagnosis not present

## 2019-01-12 DIAGNOSIS — R0602 Shortness of breath: Secondary | ICD-10-CM | POA: Diagnosis not present

## 2019-01-12 DIAGNOSIS — G7000 Myasthenia gravis without (acute) exacerbation: Secondary | ICD-10-CM | POA: Diagnosis not present

## 2019-01-19 ENCOUNTER — Other Ambulatory Visit: Payer: Self-pay | Admitting: Neurology

## 2019-02-19 ENCOUNTER — Telehealth: Payer: Self-pay | Admitting: Neurology

## 2019-02-19 DIAGNOSIS — G7000 Myasthenia gravis without (acute) exacerbation: Secondary | ICD-10-CM

## 2019-02-19 NOTE — Telephone Encounter (Signed)
Please call patient. Have him come by the office in the next 1 to 2 weeks, to have lab work rechecked, following a dose increase the Imuran.

## 2019-02-20 MED ORDER — AZATHIOPRINE 50 MG PO TABS: ORAL_TABLET | ORAL | 1 refills | Status: DC

## 2019-02-20 NOTE — Telephone Encounter (Signed)
I called the patient.  The patient was taking Imuran 50 mg twice daily and then went to 100 mg twice daily, he began having headaches and had to stop the drug and go back to 50 mg twice daily.  He is also having some problems with shortness of breath and chest pain, he will be seeing his doctor about this.  I have recommended going back up on the Imuran as he is still having trouble with swallowing and chewing.  He will try taking Imuran 50 mg 3 times a day and see if he can get by without having headaches.  He will call me if he is having problems.

## 2019-02-20 NOTE — Addendum Note (Signed)
Addended by: Kathrynn Ducking on: 02/20/2019 01:06 PM   Modules accepted: Orders

## 2019-02-20 NOTE — Telephone Encounter (Signed)
I called pt and relayed that SS/NP wanted him to get labs checked relating to imuran dosing increase.  He stated that he took for about 1 day at 100mg  po bid and stated that he started to have chest pain and stopped.  He is back at imuran 50mg  po bid and mestinon 60mg  po TID with meals. He is also having issues with insulin injections and has call to provider.  He relayed wanted to see Dr. Jannifer Franklin. I stated he has appt in 05/2019.

## 2019-03-21 DIAGNOSIS — I10 Essential (primary) hypertension: Secondary | ICD-10-CM | POA: Diagnosis not present

## 2019-03-21 DIAGNOSIS — E7800 Pure hypercholesterolemia, unspecified: Secondary | ICD-10-CM | POA: Diagnosis not present

## 2019-03-21 DIAGNOSIS — E890 Postprocedural hypothyroidism: Secondary | ICD-10-CM | POA: Diagnosis not present

## 2019-03-21 DIAGNOSIS — E1165 Type 2 diabetes mellitus with hyperglycemia: Secondary | ICD-10-CM | POA: Diagnosis not present

## 2019-03-21 DIAGNOSIS — Z6827 Body mass index (BMI) 27.0-27.9, adult: Secondary | ICD-10-CM | POA: Diagnosis not present

## 2019-03-21 DIAGNOSIS — G7000 Myasthenia gravis without (acute) exacerbation: Secondary | ICD-10-CM | POA: Diagnosis not present

## 2019-03-27 ENCOUNTER — Telehealth: Payer: Self-pay

## 2019-03-27 NOTE — Telephone Encounter (Signed)
I submitted a PA request via covermymeds, Key: [id number] - PA Case ID: [id number]. Awaiting determination which could take 48-72 hours.

## 2019-03-29 NOTE — Telephone Encounter (Signed)
Received PA approval for azathioprine [employer] [phone] thru 03-26-2119. (reached out to [employer] to confirm date.

## 2019-03-29 NOTE — Telephone Encounter (Signed)
Noted, thanks!

## 2019-05-17 ENCOUNTER — Ambulatory Visit: Payer: PPO | Admitting: Neurology

## 2019-05-17 ENCOUNTER — Other Ambulatory Visit: Payer: Self-pay

## 2019-05-17 ENCOUNTER — Encounter: Payer: Self-pay | Admitting: Neurology

## 2019-05-17 VITALS — BP 121/69 | HR 56 | Temp 97.5°F | Ht 69.0 in | Wt 176.5 lb

## 2019-05-17 DIAGNOSIS — Z5181 Encounter for therapeutic drug level monitoring: Secondary | ICD-10-CM | POA: Diagnosis not present

## 2019-05-17 DIAGNOSIS — G7000 Myasthenia gravis without (acute) exacerbation: Secondary | ICD-10-CM | POA: Diagnosis not present

## 2019-05-17 MED ORDER — AZATHIOPRINE 50 MG PO TABS: ORAL_TABLET | ORAL | 1 refills | Status: DC

## 2019-05-17 MED ORDER — PYRIDOSTIGMINE BROMIDE 60 MG PO TABS: ORAL_TABLET | ORAL | 1 refills | Status: DC

## 2019-05-17 NOTE — Progress Notes (Signed)
Reason for visit: Myasthenia gravis  Mario Burns is an 74 y.o. male  History of present illness:  Mario Burns is a 74 year old right-handed white male with a history of myasthenia gravis with pharyngeal features.  The patient had gone up on azathioprine previously because of some worsening of his symptoms, but he claims that this gave him a headache and he cut back on the medication, currently taking 50 mg twice daily.  His myasthenia has been under better control.  He more recently has had some discomfort in the left side of the abdomen.  This has been a problem over the last 2 or 3 weeks.  He remains on Mestinon taking half of a 60 mg tablet every 4 hours while awake.  Otherwise he tolerates this medication well.  He denies any weakness of the extremities.  He is not having problems with speech or swallowing.  He denies any double vision or ptosis.  Past Medical History:  Diagnosis Date  . Arthritis   . Bladder neck contracture   . Borderline hyperlipidemia   . Claustrophobia   . Complication of anesthesia    CLAUSTROPHOBIC  . GERD (gastroesophageal reflux disease)   . H/O hiatal hernia   . History of prostate cancer    S/P PROSTATECTOMY  . History of thyroid nodule    LARGE GOITER  . Hypertension   . Hypothyroidism, postsurgical   . MG (myasthenia gravis) ([facility])    DX 2013 --  NEUROLOGIST--  DR Mario Burns/  TREATED W/ MEDICATION UNTIL SYMPTOMS RESOLVED -- NO REMISSION--  SEES DR Mario Burns PRN  . OSA (obstructive sleep apnea)    NON-COMPLIANT CPAP--  CLAUSTROPHOBIC  . Shortness of breath    ALWAYS--HAD FOR YEARS - STATES HE CAN'T DO HARDLY ANYTHING WITHOUT GIVING OUT  . Type 2 diabetes mellitus ([place])   . Weak urinary stream     Past Surgical History:  Procedure Laterality Date  . CYSTOSCOPY N/A 07/30/2013   Procedure: CYSTOSCOPY WITH Anastasio Champion ;  Surgeon: Mario Amass, MD;  Location: [facility];  Service: Urology;  Laterality: N/A;  . CYSTOSCOPY WITH  URETHRAL DILATATION N/A 08/02/2012   Procedure: CYSTOSCOPY WITH URETHRAL DILATATION, AN REMOVAL STAPLE FROM BLADDER NECK;  Surgeon: Mario Amass, MD;  Location: [facility];  Service: Urology;  Laterality: N/A;  . HEMORRHOID SURGERY  1990's  . INCISIONAL HERNIA REPAIR N/A 04/23/2013   Procedure: LAPAROSCOPIC INCISIONAL HERNIA;  Surgeon: Mario Hector, MD;  Location: [facility];  Service: General;  Laterality: N/A;  . INSERTION OF MESH N/A 04/23/2013   Procedure: INSERTION OF MESH;  Surgeon: Mario Hector, MD;  Location: [facility];  Service: General;  Laterality: N/A;  . ROBOT ASSISTED LAPAROSCOPIC RADICAL PROSTATECTOMY  12/15/2011   Procedure: ROBOTIC ASSISTED LAPAROSCOPIC RADICAL PROSTATECTOMY;  Surgeon: Mario Amass, MD;  Location: [facility];  Service: Urology;  Laterality: N/A;  . TOTAL THYROIDECTOMY  05-24-2003    Family History  Problem Relation Age of Onset  . Heart disease Mother   . Glaucoma Mother   . Hypertension Mother   . Colon cancer Father   . Diabetes Sister   . Diabetes Brother   . Diabetes Brother   . Cancer Brother     Social history:  reports that he has never smoked. He has never used smokeless tobacco. He reports that he does not drink alcohol or use drugs.    Allergies  Allergen Reactions  . Sulfa Antibiotics Other (  See Comments)    unknown    Medications:  Prior to Admission medications   Medication Sig Start Date End Date Taking? Authorizing Provider  azaTHIOprine (IMURAN) 50 MG tablet 1 tablet 3 times daily 02/20/19  Yes Mario Ducking, MD  diclofenac (VOLTAREN) 0.1 % ophthalmic solution Place 1 drop into the left eye 2 (two) times daily. 12/13/18  Yes [provider]  glimepiride (AMARYL) 4 MG tablet Take 4 mg by mouth 2 (two) times daily.   Yes [provider]  levothyroxine (SYNTHROID) 150 MCG tablet Take 150 mcg by mouth daily. 03/21/19  Yes [provider]  losartan (COZAAR) 100 MG tablet Take 100 mg by mouth  daily. 03/21/19  Yes [provider]  naproxen sodium (ANAPROX) 220 MG tablet Take 440 mg by mouth every morning.    Yes [provider]  omeprazole (PRILOSEC) 20 MG capsule Take 20 mg by mouth every evening.    Yes [provider]  ONE TOUCH ULTRA TEST test strip  03/23/16  Yes [provider]  Jonetta Speak LANCETS [id number] [place]  03/23/16  Yes [provider]  pyridostigmine (MESTINON) 60 MG tablet TAKE 1/2 (ONE-HALF) TABLET BY MOUTH EVERY 4 HOURS WHILE AWAKE. 01/19/19  Yes Mario Ducking, MD  TOUJEO MAX SOLOSTAR 300 UNIT/ML SOPN Reports he is taking 18 units in the evening 04/22/18  Yes [provider]    ROS:  Out of a complete 14 system review of symptoms, the patient complains only of the following symptoms, and all other reviewed systems are negative.  Abdominal discomfort  Blood pressure 121/69, pulse (!) 56, temperature (!) 97.5 F (36.4 C), height 5\' 9"  (1.753 m), weight 176 lb 8 oz (80.1 kg).  Physical Exam  General: The patient is alert and cooperative at the time of the examination.  Skin: No significant peripheral edema is noted.   Neurologic Exam  Mental status: The patient is alert and oriented x 3 at the time of the examination. The patient has apparent normal recent and remote memory, with an apparently normal attention span and concentration ability.   Cranial nerves: Facial symmetry is present. Speech is normal, no aphasia or dysarthria is noted. Extraocular movements are full. Visual fields are full.  Motor: The patient has good strength in all 4 extremities.  Sensory examination: Soft touch sensation is symmetric on the face, arms, and legs.  Coordination: The patient has good finger-nose-finger and heel-to-shin bilaterally.  Gait and station: The patient has a normal gait. Tandem gait is slightly unsteady. Romberg is negative. No drift is seen.  Reflexes: Deep tendon reflexes are  symmetric.   Assessment/Plan:  1.  Myasthenia gravis  Overall, he seems to be doing well with the myasthenic symptoms.  He will continue the azathioprine at 50 mg twice daily and the Mestinon, prescriptions were sent in.  Blood work will be done today.  He will follow-up in 6 months.  Jill Alexanders MD 05/17/2019 9:46 AM  [facility] [address] [place], [place] [address]  Phone [phone] Fax [phone]

## 2019-05-18 LAB — COMPREHENSIVE METABOLIC PANEL
ALT: 24 IU/L (ref 0–44)
AST: 22 IU/L (ref 0–40)
Albumin/Globulin Ratio: 1.8 (ref 1.2–2.2)
Albumin: 4.1 g/dL (ref 3.7–4.7)
Alkaline Phosphatase: 93 IU/L (ref 39–117)
BUN/Creatinine Ratio: 15 (ref 10–24)
BUN: 13 mg/dL (ref 8–27)
Bilirubin Total: 0.8 mg/dL (ref 0.0–1.2)
CO2: 22 mmol/L (ref 20–29)
Calcium: 9.1 mg/dL (ref 8.6–10.2)
Chloride: 103 mmol/L (ref 96–106)
Creatinine, Ser: 0.84 mg/dL (ref 0.76–1.27)
GFR calc Af Amer: 100 mL/min/{1.73_m2} (ref >59)
GFR calc non Af Amer: 86 mL/min/{1.73_m2} (ref >59)
Globulin, Total: 2.3 g/dL (ref 1.5–4.5)
Glucose: 136 mg/dL — ABNORMAL HIGH (ref 65–99)
Potassium: 4.1 mmol/L (ref 3.5–5.2)
Sodium: 135 mmol/L (ref 134–144)
Total Protein: 6.4 g/dL (ref 6.0–8.5)

## 2019-05-18 LAB — CBC WITH DIFFERENTIAL/PLATELET
Basophils Absolute: 0 10*3/uL (ref 0.0–0.2)
Basos: 1 %
EOS (ABSOLUTE): 0.1 10*3/uL (ref 0.0–0.4)
Eos: 2 %
Hematocrit: 42.1 % (ref 37.5–51.0)
Hemoglobin: 13.7 g/dL (ref 13.0–17.7)
Immature Grans (Abs): 0 10*3/uL (ref 0.0–0.1)
Immature Granulocytes: 0 %
Lymphocytes Absolute: 1.7 10*3/uL (ref 0.7–3.1)
Lymphs: 34 %
MCH: 30.1 pg (ref 26.6–33.0)
MCHC: 32.5 g/dL (ref 31.5–35.7)
MCV: 93 fL (ref 79–97)
Monocytes Absolute: 0.5 10*3/uL (ref 0.1–0.9)
Monocytes: 10 %
Neutrophils Absolute: 2.6 10*3/uL (ref 1.4–7.0)
Neutrophils: 53 %
Platelets: 225 10*3/uL (ref 150–450)
RBC: 4.55 x10E6/uL (ref 4.14–5.80)
RDW: 13.1 % (ref 11.6–15.4)
WBC: 5 10*3/uL (ref 3.4–10.8)

## 2019-05-29 ENCOUNTER — Telehealth: Payer: Self-pay

## 2019-05-29 NOTE — Telephone Encounter (Signed)
-----   Message from Kathrynn Ducking, MD sent at 05/18/2019  7:25 AM EDT -----  The blood work results are unremarkable. Please call the patient. ----- Message ----- From: Lavone Neri Lab Results In Sent: 05/18/2019   5:37 AM EDT To: Kathrynn Ducking, MD

## 2019-05-29 NOTE — Telephone Encounter (Signed)
I called pt that his lab work was unremarkable.Pt verbalized understanding.

## 2019-06-21 DIAGNOSIS — E890 Postprocedural hypothyroidism: Secondary | ICD-10-CM | POA: Diagnosis not present

## 2019-06-21 DIAGNOSIS — I10 Essential (primary) hypertension: Secondary | ICD-10-CM | POA: Diagnosis not present

## 2019-06-21 DIAGNOSIS — G7000 Myasthenia gravis without (acute) exacerbation: Secondary | ICD-10-CM | POA: Diagnosis not present

## 2019-06-21 DIAGNOSIS — E1165 Type 2 diabetes mellitus with hyperglycemia: Secondary | ICD-10-CM | POA: Diagnosis not present

## 2019-06-21 DIAGNOSIS — Z6827 Body mass index (BMI) 27.0-27.9, adult: Secondary | ICD-10-CM | POA: Diagnosis not present

## 2019-06-21 DIAGNOSIS — Z1331 Encounter for screening for depression: Secondary | ICD-10-CM | POA: Diagnosis not present

## 2019-09-26 DIAGNOSIS — I10 Essential (primary) hypertension: Secondary | ICD-10-CM | POA: Diagnosis not present

## 2019-09-26 DIAGNOSIS — Z6827 Body mass index (BMI) 27.0-27.9, adult: Secondary | ICD-10-CM | POA: Diagnosis not present

## 2019-09-26 DIAGNOSIS — E1165 Type 2 diabetes mellitus with hyperglycemia: Secondary | ICD-10-CM | POA: Diagnosis not present

## 2019-09-26 DIAGNOSIS — G7000 Myasthenia gravis without (acute) exacerbation: Secondary | ICD-10-CM | POA: Diagnosis not present

## 2019-09-26 DIAGNOSIS — L84 Corns and callosities: Secondary | ICD-10-CM | POA: Diagnosis not present

## 2019-09-26 DIAGNOSIS — E890 Postprocedural hypothyroidism: Secondary | ICD-10-CM | POA: Diagnosis not present

## 2019-09-26 DIAGNOSIS — Z139 Encounter for screening, unspecified: Secondary | ICD-10-CM | POA: Diagnosis not present

## 2019-11-26 ENCOUNTER — Ambulatory Visit: Payer: PPO | Admitting: Neurology

## 2019-11-26 ENCOUNTER — Other Ambulatory Visit: Payer: Self-pay

## 2019-11-26 ENCOUNTER — Encounter: Payer: Self-pay | Admitting: Neurology

## 2019-11-26 VITALS — BP 155/82 | HR 79 | Ht 70.0 in | Wt 179.0 lb

## 2019-11-26 DIAGNOSIS — G7000 Myasthenia gravis without (acute) exacerbation: Secondary | ICD-10-CM | POA: Diagnosis not present

## 2019-11-26 DIAGNOSIS — Z5181 Encounter for therapeutic drug level monitoring: Secondary | ICD-10-CM

## 2019-11-26 MED ORDER — AZATHIOPRINE 50 MG PO TABS: ORAL_TABLET | ORAL | 1 refills | Status: DC

## 2019-11-26 NOTE — Progress Notes (Signed)
Reason for visit: Myasthenia gravis  Mario Burns is an 74 y.o. male  History of present illness:  Mario Burns is a 75 year old right-handed white male with a history of myasthenia gravis associated with pharyngeal features.  He has done quite well since last seen, he remains on azathioprine 50 mg twice daily and he is on Mestinon.  The patient has some chronic shortness of breath but he states that this has been an issue for several years and has not worsened.  He denies any issues with speech or swallowing.  He feels that he is doing well.  He has not had any ocular symptoms.  He reports good strength with the arms and legs.  He has not yet gotten the Covid vaccination, and he claims that he never gets the flu vaccination.  Past Medical History:  Diagnosis Date   Arthritis    Bladder neck contracture    Borderline hyperlipidemia    Claustrophobia    Complication of anesthesia    CLAUSTROPHOBIC   GERD (gastroesophageal reflux disease)    H/O hiatal hernia    History of prostate cancer    S/P PROSTATECTOMY   History of thyroid nodule    LARGE GOITER   Hypertension    Hypothyroidism, postsurgical    MG (myasthenia gravis) ([place])    DX 2013 --  NEUROLOGIST--  DR Mario Burns/  TREATED W/ MEDICATION UNTIL SYMPTOMS RESOLVED -- NO REMISSION--  SEES DR Mario Burns PRN   OSA (obstructive sleep apnea)    NON-COMPLIANT CPAP--  CLAUSTROPHOBIC   Shortness of breath    ALWAYS--HAD FOR YEARS - STATES HE CAN'T DO HARDLY ANYTHING WITHOUT GIVING OUT   Type 2 diabetes mellitus ([facility])    Weak urinary stream     Past Surgical History:  Procedure Laterality Date   CYSTOSCOPY N/A 07/30/2013   Procedure: CYSTOSCOPY WITH Anastasio Champion ;  Surgeon: Bernestine Amass, MD;  Location: [facility];  Service: Urology;  Laterality: N/A;   CYSTOSCOPY WITH URETHRAL DILATATION N/A 08/02/2012   Procedure: CYSTOSCOPY WITH URETHRAL DILATATION, AN REMOVAL STAPLE FROM BLADDER NECK;   Surgeon: Bernestine Amass, MD;  Location: [facility];  Service: Urology;  Laterality: N/A;   HEMORRHOID SURGERY  1990's   INCISIONAL HERNIA REPAIR N/A 04/23/2013   Procedure: LAPAROSCOPIC INCISIONAL HERNIA;  Surgeon: [name], MD;  Location: [facility];  Service: General;  Laterality: N/A;   INSERTION OF MESH N/A 04/23/2013   Procedure: INSERTION OF MESH;  Surgeon: [name], MD;  Location: [facility];  Service: General;  Laterality: N/A;   ROBOT ASSISTED LAPAROSCOPIC RADICAL PROSTATECTOMY  12/15/2011   Procedure: ROBOTIC ASSISTED LAPAROSCOPIC RADICAL PROSTATECTOMY;  Surgeon: Bernestine Amass, MD;  Location[facility];  Service: Urology;  Laterality: N/A;   TOTAL THYROIDECTOMY  05-24-2003    Family History  Problem Relation Age of Onset   Heart disease Mother    Glaucoma Mother    Hypertension Mother    Colon cancer Father    Diabetes Sister    Diabetes Brother    Diabetes Brother    Cancer Brother     Social history:  reports that he has never smoked. He has never used smokeless tobacco. He reports that he does not drink alcohol and does not use drugs.    Allergies  Allergen Reactions   Sulfa Antibiotics Other (See Comments)    unknown    Medications:  Prior to Admission medications   Medication Sig Start  Date End Date Taking? Authorizing Provider  azaTHIOprine (IMURAN) 50 MG tablet 1 tablet 2 times daily 05/17/19  Yes Kathrynn Ducking, MD  glimepiride (AMARYL) 4 MG tablet Take 4 mg by mouth 2 (two) times daily.   Yes [provider]  levothyroxine (SYNTHROID) 150 MCG tablet Take 150 mcg by mouth daily. 03/21/19  Yes [provider]  losartan (COZAAR) 100 MG tablet Take 100 mg by mouth daily. 03/21/19  Yes [provider]  naproxen sodium (ANAPROX) 220 MG tablet Take 440 mg by mouth every morning.    Yes [provider]  omeprazole (PRILOSEC) 20 MG capsule Take 20 mg by mouth every evening.    Yes [provider]  ONE TOUCH ULTRA TEST test strip  03/23/16  Yes [provider]  Jonetta Speak LANCETS 37S [place]  03/23/16  Yes [provider]  pyridostigmine (MESTINON) 60 MG tablet TAKE 1/2 (ONE-HALF) TABLET BY MOUTH EVERY 4 HOURS WHILE AWAKE. 05/17/19  Yes Kathrynn Ducking, MD  TOUJEO MAX SOLOSTAR 300 UNIT/ML SOPN Reports he is taking 18 units in the evening 04/22/18  Yes [provider]  diclofenac (VOLTAREN) 0.1 % ophthalmic solution Place 1 drop into the left eye 2 (two) times daily. 12/13/18   [provider]    ROS:  Out of a complete 14 system review of symptoms, the patient complains only of the following symptoms, and all other reviewed systems are negative.  Shortness of breath  Blood pressure (!) 155/82, pulse 79, height 5\' 10"  (1.778 m), weight 179 lb (81.2 kg).  Physical Exam  General: The patient is alert and cooperative at the time of the examination.  Skin: No significant peripheral edema is noted.   Neurologic Exam  Mental status: The patient is alert and oriented x 3 at the time of the examination. The patient has apparent normal recent and remote memory, with an apparently normal attention span and concentration ability.   Cranial nerves: Facial symmetry is present. Speech is normal, no aphasia or dysarthria is noted. Extraocular movements are full. Visual fields are full.  The patient has good strength with eye closure and jaw opening and closure.  Motor: The patient has good strength in all 4 extremities.  Sensory examination: Soft touch sensation is symmetric on the face, arms, and legs.  Coordination: The patient has good finger-nose-finger and heel-to-shin bilaterally.  Gait and station: The patient has a normal gait. Tandem gait is normal. Romberg is negative. No drift is seen.  Reflexes: Deep tendon reflexes are symmetric.   Assessment/Plan:  1.  Myasthenia gravis  The patient is doing well on his current  medication regimen.  A prescription for the azathioprine was sent in.  He will get blood work today.  I spent some time today emphasizing why he should get the Covid vaccination and the flu vaccination as he is immunosuppressed.  He will follow-up here in 6 months.  Greater than 50% of the visit was spent in counseling and coordination of care.  Face-to-face time with the patient was 20 minutes.   Jill Alexanders MD 11/26/2019 8:57 AM  [facility] [address] [place], [place] [address]  Phone [phone] Fax [phone]

## 2019-11-27 LAB — COMPREHENSIVE METABOLIC PANEL
ALT: 23 IU/L (ref 0–44)
AST: 19 IU/L (ref 0–40)
Albumin/Globulin Ratio: 1.7 (ref 1.2–2.2)
Albumin: 4 g/dL (ref 3.7–4.7)
Alkaline Phosphatase: 88 IU/L (ref 44–121)
BUN/Creatinine Ratio: 11 (ref 10–24)
BUN: 9 mg/dL (ref 8–27)
Bilirubin Total: 0.7 mg/dL (ref 0.0–1.2)
CO2: 22 mmol/L (ref 20–29)
Calcium: 9.4 mg/dL (ref 8.6–10.2)
Chloride: 101 mmol/L (ref 96–106)
Creatinine, Ser: 0.8 mg/dL (ref 0.76–1.27)
GFR calc Af Amer: 102 mL/min/{1.73_m2} (ref >59)
GFR calc non Af Amer: 88 mL/min/{1.73_m2} (ref >59)
Globulin, Total: 2.4 g/dL (ref 1.5–4.5)
Glucose: 200 mg/dL — ABNORMAL HIGH (ref 65–99)
Potassium: 4.3 mmol/L (ref 3.5–5.2)
Sodium: 135 mmol/L (ref 134–144)
Total Protein: 6.4 g/dL (ref 6.0–8.5)

## 2019-11-27 LAB — CBC WITH DIFFERENTIAL/PLATELET
Basophils Absolute: 0.1 10*3/uL (ref 0.0–0.2)
Basos: 1 %
EOS (ABSOLUTE): 0.1 10*3/uL (ref 0.0–0.4)
Eos: 2 %
Hematocrit: 41.9 % (ref 37.5–51.0)
Hemoglobin: 14.2 g/dL (ref 13.0–17.7)
Immature Grans (Abs): 0 10*3/uL (ref 0.0–0.1)
Immature Granulocytes: 1 %
Lymphocytes Absolute: 1.7 10*3/uL (ref 0.7–3.1)
Lymphs: 31 %
MCH: 31.1 pg (ref 26.6–33.0)
MCHC: 33.9 g/dL (ref 31.5–35.7)
MCV: 92 fL (ref 79–97)
Monocytes Absolute: 0.5 10*3/uL (ref 0.1–0.9)
Monocytes: 10 %
Neutrophils Absolute: 3 10*3/uL (ref 1.4–7.0)
Neutrophils: 55 %
Platelets: 241 10*3/uL (ref 150–450)
RBC: 4.57 x10E6/uL (ref 4.14–5.80)
RDW: 13.1 % (ref 11.6–15.4)
WBC: 5.3 10*3/uL (ref 3.4–10.8)

## 2019-11-28 ENCOUNTER — Telehealth: Payer: Self-pay | Admitting: Emergency Medicine

## 2019-11-28 NOTE — Telephone Encounter (Signed)
-----   Message from Kathrynn Ducking, MD sent at 11/27/2019  6:56 AM EST ----- The blood work results are unremarkable with exception of elevated blood glucose, random.  CBC is unremarkable, liver enzymes are unremarkable on azathioprine.  No change in medical therapy recommended.  Please call the patient. ----- Message ----- From: Lavone Neri Lab Results In Sent: 11/27/2019   5:37 AM EST To: Kathrynn Ducking, MD

## 2019-11-28 NOTE — Telephone Encounter (Signed)
Called and spoke to patient's wife, went over Dr. Tobey Grim findings of blood lab results.  She denied any questions, was already aware of patient's blood sugar being elevated.  She expressed appreciation.

## 2020-01-09 DIAGNOSIS — G7000 Myasthenia gravis without (acute) exacerbation: Secondary | ICD-10-CM | POA: Diagnosis not present

## 2020-01-09 DIAGNOSIS — I10 Essential (primary) hypertension: Secondary | ICD-10-CM | POA: Diagnosis not present

## 2020-01-09 DIAGNOSIS — M1711 Unilateral primary osteoarthritis, right knee: Secondary | ICD-10-CM | POA: Diagnosis not present

## 2020-01-09 DIAGNOSIS — C61 Malignant neoplasm of prostate: Secondary | ICD-10-CM | POA: Diagnosis not present

## 2020-01-09 DIAGNOSIS — E890 Postprocedural hypothyroidism: Secondary | ICD-10-CM | POA: Diagnosis not present

## 2020-01-09 DIAGNOSIS — R5383 Other fatigue: Secondary | ICD-10-CM | POA: Diagnosis not present

## 2020-01-09 DIAGNOSIS — Z6828 Body mass index (BMI) 28.0-28.9, adult: Secondary | ICD-10-CM | POA: Diagnosis not present

## 2020-01-09 DIAGNOSIS — E1165 Type 2 diabetes mellitus with hyperglycemia: Secondary | ICD-10-CM | POA: Diagnosis not present

## 2020-04-28 DIAGNOSIS — M1711 Unilateral primary osteoarthritis, right knee: Secondary | ICD-10-CM | POA: Diagnosis not present

## 2020-04-28 DIAGNOSIS — E1165 Type 2 diabetes mellitus with hyperglycemia: Secondary | ICD-10-CM | POA: Diagnosis not present

## 2020-04-28 DIAGNOSIS — E890 Postprocedural hypothyroidism: Secondary | ICD-10-CM | POA: Diagnosis not present

## 2020-04-28 DIAGNOSIS — I10 Essential (primary) hypertension: Secondary | ICD-10-CM | POA: Diagnosis not present

## 2020-04-28 DIAGNOSIS — Z6828 Body mass index (BMI) 28.0-28.9, adult: Secondary | ICD-10-CM | POA: Diagnosis not present

## 2020-04-28 DIAGNOSIS — R3911 Hesitancy of micturition: Secondary | ICD-10-CM | POA: Diagnosis not present

## 2020-04-28 DIAGNOSIS — G7000 Myasthenia gravis without (acute) exacerbation: Secondary | ICD-10-CM | POA: Diagnosis not present

## 2020-04-28 DIAGNOSIS — R3129 Other microscopic hematuria: Secondary | ICD-10-CM | POA: Diagnosis not present

## 2020-05-26 ENCOUNTER — Ambulatory Visit: Payer: PPO | Admitting: Neurology

## 2020-05-26 ENCOUNTER — Encounter: Payer: Self-pay | Admitting: Neurology

## 2020-05-26 VITALS — BP 164/74 | HR 59 | Ht 70.0 in | Wt 178.6 lb

## 2020-05-26 DIAGNOSIS — G7000 Myasthenia gravis without (acute) exacerbation: Secondary | ICD-10-CM

## 2020-05-26 DIAGNOSIS — Z5181 Encounter for therapeutic drug level monitoring: Secondary | ICD-10-CM

## 2020-05-26 NOTE — Progress Notes (Signed)
Reason for visit: Myasthenia gravis  Mario Burns is an 75 y.o. male  History of present illness:  Mario Burns is a 75 year old right-handed white male with a history of myasthenia gravis associated with pharyngeal features.  He does not have any double vision.  He does have some occasional problems with swallowing but he does not choke.  He does have some mild shortness of breath and some generalized fatigue.  The patient is on Mestinon and azathioprine.  He has some diarrhea on the Mestinon.  He has some mild gait instability, he had a fall 2 weeks ago and hurt his left shoulder.  The patient has not had any significant changes in his myasthenic symptoms.  He gets blood work done through his primary care physician twice a year, he gets blood work done here twice a year.  Past Medical History:  Diagnosis Date  . Arthritis   . Bladder neck contracture   . Borderline hyperlipidemia   . Claustrophobia   . Complication of anesthesia    CLAUSTROPHOBIC  . GERD (gastroesophageal reflux disease)   . H/O hiatal hernia   . History of prostate cancer    S/P PROSTATECTOMY  . History of thyroid nodule    LARGE GOITER  . Hypertension   . Hypothyroidism, postsurgical   . MG (myasthenia gravis) ([place])    DX 2013 --  NEUROLOGIST--  DR Elizabethann Lackey/  TREATED W/ MEDICATION UNTIL SYMPTOMS RESOLVED -- NO REMISSION--  SEES DR Tison Burns PRN  . OSA (obstructive sleep apnea)    NON-COMPLIANT CPAP--  CLAUSTROPHOBIC  . Shortness of breath    ALWAYS--HAD FOR YEARS - STATES HE CAN'T DO HARDLY ANYTHING WITHOUT GIVING OUT  . Type 2 diabetes mellitus ([place])   . Weak urinary stream     Past Surgical History:  Procedure Laterality Date  . CYSTOSCOPY N/A 07/30/2013   Procedure: CYSTOSCOPY WITH Anastasio Champion ;  Surgeon: Bernestine Amass, MD;  Location: [facility];  Service: Urology;  Laterality: N/A;  . CYSTOSCOPY WITH URETHRAL DILATATION N/A 08/02/2012   Procedure: CYSTOSCOPY WITH URETHRAL  DILATATION, AN REMOVAL STAPLE FROM BLADDER NECK;  Surgeon: Bernestine Amass, MD;  Location: [facility];  Service: Urology;  Laterality: N/A;  . HEMORRHOID SURGERY  1990's  . INCISIONAL HERNIA REPAIR N/A 04/23/2013   Procedure: LAPAROSCOPIC INCISIONAL HERNIA;  Surgeon: [name], MD;  Location: [facility];  Service: General;  Laterality: N/A;  . INSERTION OF MESH N/A 04/23/2013   Procedure: INSERTION OF MESH;  Surgeon: [name], MD;  Location: [facility];  Service: General;  Laterality: N/A;  . ROBOT ASSISTED LAPAROSCOPIC RADICAL PROSTATECTOMY  12/15/2011   Procedure: ROBOTIC ASSISTED LAPAROSCOPIC RADICAL PROSTATECTOMY;  Surgeon: Bernestine Amass, MD;  Location: [facility];  Service: Urology;  Laterality: N/A;  . TOTAL THYROIDECTOMY  05-24-2003    Family History  Problem Relation Age of Onset  . Heart disease Mother   . Glaucoma Mother   . Hypertension Mother   . Colon cancer Father   . Diabetes Sister   . Diabetes Brother   . Diabetes Brother   . Cancer Brother     Social history:  reports that he has never smoked. He has never used smokeless tobacco. He reports that he does not drink alcohol and does not use drugs.    Allergies  Allergen Reactions  . Sulfa Antibiotics Other (See Comments)    unknown    Medications:  Prior to Admission medications  Medication Sig Start Date End Date Taking? Authorizing Provider  azaTHIOprine (IMURAN) 50 MG tablet 1 tablet 2 times daily 11/26/19  Yes Kathrynn Ducking, MD  glimepiride (AMARYL) 4 MG tablet Take 4 mg by mouth 2 (two) times daily.   Yes [provider]  levothyroxine (SYNTHROID) 150 MCG tablet Take 150 mcg by mouth daily. 03/21/19  Yes [provider]  losartan (COZAAR) 100 MG tablet Take 100 mg by mouth daily. 03/21/19  Yes [provider]  naproxen sodium (ANAPROX) 220 MG tablet Take 440 mg by mouth every morning.    Yes [provider]  omeprazole (PRILOSEC) 20 MG capsule Take 20  mg by mouth every evening.    Yes [provider]  ONE TOUCH ULTRA TEST test strip  03/23/16  Yes [provider]  Jonetta Speak LANCETS 40X [place]  03/23/16  Yes [provider]  pyridostigmine (MESTINON) 60 MG tablet TAKE 1/2 (ONE-HALF) TABLET BY MOUTH EVERY 4 HOURS WHILE AWAKE. Patient taking differently: 30 mg in the morning and at bedtime. 05/17/19  Yes Kathrynn Ducking, MD  TOUJEO MAX SOLOSTAR 300 UNIT/ML SOPN Reports he is taking 18 units in the evening 04/22/18  Yes [provider]  diclofenac (VOLTAREN) 0.1 % ophthalmic solution Place 1 drop into the left eye 2 (two) times daily. 12/13/18   [provider]    ROS:  Out of a complete 14 system review of symptoms, the patient complains only of the following symptoms, and all other reviewed systems are negative.  Difficulty swallowing Left shoulder pain Knee arthritis  Blood pressure (!) 164/74, pulse (!) 59, height 5\' 10"  (1.778 m), weight 178 lb 9.6 oz (81 kg).  Physical Exam  General: The patient is alert and cooperative at the time of the examination.  Skin: No significant peripheral edema is noted.   Neurologic Exam  Mental status: The patient is alert and oriented x 3 at the time of the examination. The patient has apparent normal recent and remote memory, with an apparently normal attention span and concentration ability.   Cranial nerves: Facial symmetry is present. Speech is normal, no aphasia or dysarthria is noted. Extraocular movements are full. Visual fields are full.  With superior gaze for 1 minute, no divergence of gaze or subjective double vision or ptosis was seen.  Motor: The patient has good strength in all 4 extremities.  With arms outstretched for 1 minute, no fatigable weakness was seen on the right deltoid, the patient has some giveaway weakness on the left secondary to pain.  Sensory examination: Soft touch sensation is symmetric on the face, arms, and  legs.  Coordination: The patient has good finger-nose-finger and heel-to-shin bilaterally.  Gait and station: The patient has a normal gait. Tandem gait is normal. Romberg is negative. No drift is seen.  Reflexes: Deep tendon reflexes are symmetric.   Assessment/Plan:  1.  Myasthenia gravis  The patient has been relatively stable on azathioprine.  He is having ongoing side effects with diarrhea with the Mestinon, he may cut back on the medication and use only if needed.  The patient will have blood work done today, he will follow-up here in 6 months, I will be retiring in the near future, he may be transferred to the care of Dr. Krista Blue.  Jill Alexanders MD 05/26/2020 9:19 AM  [facility] [address] [place], [place] [address]  Phone [phone] Fax [phone]

## 2020-05-27 DIAGNOSIS — R1012 Left upper quadrant pain: Secondary | ICD-10-CM | POA: Diagnosis not present

## 2020-05-27 DIAGNOSIS — H6120 Impacted cerumen, unspecified ear: Secondary | ICD-10-CM | POA: Diagnosis not present

## 2020-05-27 DIAGNOSIS — Z6827 Body mass index (BMI) 27.0-27.9, adult: Secondary | ICD-10-CM | POA: Diagnosis not present

## 2020-05-27 DIAGNOSIS — R0789 Other chest pain: Secondary | ICD-10-CM | POA: Diagnosis not present

## 2020-05-27 LAB — CBC WITH DIFFERENTIAL/PLATELET
Basophils Absolute: 0 10*3/uL (ref 0.0–0.2)
Basos: 1 %
EOS (ABSOLUTE): 0.1 10*3/uL (ref 0.0–0.4)
Eos: 1 %
Hematocrit: 40.6 % (ref 37.5–51.0)
Hemoglobin: 13.7 g/dL (ref 13.0–17.7)
Immature Grans (Abs): 0 10*3/uL (ref 0.0–0.1)
Immature Granulocytes: 0 %
Lymphocytes Absolute: 1.3 10*3/uL (ref 0.7–3.1)
Lymphs: 25 %
MCH: 30.9 pg (ref 26.6–33.0)
MCHC: 33.7 g/dL (ref 31.5–35.7)
MCV: 91 fL (ref 79–97)
Monocytes Absolute: 0.5 10*3/uL (ref 0.1–0.9)
Monocytes: 9 %
Neutrophils Absolute: 3.3 10*3/uL (ref 1.4–7.0)
Neutrophils: 64 %
Platelets: 222 10*3/uL (ref 150–450)
RBC: 4.44 x10E6/uL (ref 4.14–5.80)
RDW: 12.9 % (ref 11.6–15.4)
WBC: 5.3 10*3/uL (ref 3.4–10.8)

## 2020-05-27 LAB — COMPREHENSIVE METABOLIC PANEL
ALT: 20 IU/L (ref 0–44)
AST: 19 IU/L (ref 0–40)
Albumin/Globulin Ratio: 1.7 (ref 1.2–2.2)
Albumin: 3.9 g/dL (ref 3.7–4.7)
Alkaline Phosphatase: 88 IU/L (ref 44–121)
BUN/Creatinine Ratio: 17 (ref 10–24)
BUN: 13 mg/dL (ref 8–27)
Bilirubin Total: 0.8 mg/dL (ref 0.0–1.2)
CO2: 23 mmol/L (ref 20–29)
Calcium: 8.9 mg/dL (ref 8.6–10.2)
Chloride: 100 mmol/L (ref 96–106)
Creatinine, Ser: 0.75 mg/dL — ABNORMAL LOW (ref 0.76–1.27)
Globulin, Total: 2.3 g/dL (ref 1.5–4.5)
Glucose: 127 mg/dL — ABNORMAL HIGH (ref 65–99)
Potassium: 4.4 mmol/L (ref 3.5–5.2)
Sodium: 135 mmol/L (ref 134–144)
Total Protein: 6.2 g/dL (ref 6.0–8.5)
eGFR: 94 mL/min/{1.73_m2} (ref >59)

## 2020-06-09 ENCOUNTER — Telehealth: Payer: Self-pay | Admitting: Emergency Medicine

## 2020-06-09 NOTE — Telephone Encounter (Signed)
Called and spoke to patient's wife regarding Dr. Tobey Grim review and findings of patient's blood work.  Patient denied further questions, verbalized understanding and expressed appreciation for the phone call.

## 2020-06-09 NOTE — Telephone Encounter (Signed)
-----   Message from Kathrynn Ducking, MD sent at 05/27/2020  7:00 AM EDT -----  The blood work results are unremarkable. No change in medical therapy. Please call the patient. ----- Message ----- From: Lavone Neri Lab Results In Sent: 05/27/2020   5:38 AM EDT To: Kathrynn Ducking, MD

## 2020-06-30 ENCOUNTER — Other Ambulatory Visit: Payer: Self-pay | Admitting: Neurology

## 2020-08-04 DIAGNOSIS — Z9181 History of falling: Secondary | ICD-10-CM | POA: Diagnosis not present

## 2020-08-04 DIAGNOSIS — E7800 Pure hypercholesterolemia, unspecified: Secondary | ICD-10-CM | POA: Diagnosis not present

## 2020-08-04 DIAGNOSIS — E1165 Type 2 diabetes mellitus with hyperglycemia: Secondary | ICD-10-CM | POA: Diagnosis not present

## 2020-08-04 DIAGNOSIS — G7000 Myasthenia gravis without (acute) exacerbation: Secondary | ICD-10-CM | POA: Diagnosis not present

## 2020-08-04 DIAGNOSIS — E890 Postprocedural hypothyroidism: Secondary | ICD-10-CM | POA: Diagnosis not present

## 2020-08-04 DIAGNOSIS — I10 Essential (primary) hypertension: Secondary | ICD-10-CM | POA: Diagnosis not present

## 2020-08-04 DIAGNOSIS — Z1331 Encounter for screening for depression: Secondary | ICD-10-CM | POA: Diagnosis not present

## 2020-08-04 DIAGNOSIS — R0602 Shortness of breath: Secondary | ICD-10-CM | POA: Diagnosis not present

## 2020-08-04 DIAGNOSIS — M1711 Unilateral primary osteoarthritis, right knee: Secondary | ICD-10-CM | POA: Diagnosis not present

## 2020-08-05 ENCOUNTER — Telehealth: Payer: Self-pay | Admitting: *Deleted

## 2020-08-05 NOTE — Telephone Encounter (Signed)
NOTES  RECIEVED FROM [place] SCHEDULING TO MAKE APPOINTMENT

## 2020-08-06 NOTE — Progress Notes (Addendum)
Cardiology Office Note:    Date:  08/07/2020   ID:  Mario Burns, DOB 1945-09-03, MRN [id number]  PCP:  Cyndi Bender, PA-C  Cardiologist:  None  Electrophysiologist:  None   Referring MD: Cyndi Bender, PA-C   Chief Complaint  Patient presents with   Chest Pain     History of Present Illness:    Mario Burns is a 75 y.o. male with a hx of hypertension, myasthenia gravis, hypothyroidism, T2DM who is referred by Cyndi Bender, PA for evaluation of shortness of breath.  He reports that he has been having chest pain and shortness of breath.  Reports had 2-3 episodes of chest pain over the last 2 weeks.  Reports pain is in center of his chest, sometimes sharp but sometimes pressure.  Often wakes up with it at night.  He does not exercise.  Reports has had chest pain with exertion but also can occur at rest.  Does report he gets short of breath with minimal exertion.  Reports chest pain can last for 15 minutes or so, but has lasted up to 1 hour.  Reports lightheadedness intermittently, denies any syncope.  Does report intermittent lower extremity edema.  Reports rare palpitations, none recently.  Does not check BP at home.  No smoking history.  Family history includes mother had heart failure, died at 19.  His sister sees a cardiologist, he is unsure of the details.  Past Medical History:  Diagnosis Date   Arthritis    Bladder neck contracture    Borderline hyperlipidemia    Claustrophobia    Complication of anesthesia    CLAUSTROPHOBIC   GERD (gastroesophageal reflux disease)    H/O hiatal hernia    History of prostate cancer    S/P PROSTATECTOMY   History of thyroid nodule    LARGE GOITER   Hypertension    Hypothyroidism, postsurgical    MG (myasthenia gravis) ([place])    DX 2013 --  NEUROLOGIST--  DR WILLIS/  TREATED W/ MEDICATION UNTIL SYMPTOMS RESOLVED -- NO REMISSION--  SEES DR WILLIS PRN   OSA (obstructive sleep apnea)    NON-COMPLIANT CPAP--  CLAUSTROPHOBIC   Shortness  of breath    ALWAYS--HAD FOR YEARS - STATES HE CAN'T DO HARDLY ANYTHING WITHOUT GIVING OUT   Type 2 diabetes mellitus (HCC)    Weak urinary stream     Past Surgical History:  Procedure Laterality Date   CYSTOSCOPY N/A 07/30/2013   Procedure: CYSTOSCOPY WITH Anastasio Champion ;  Surgeon: Bernestine Amass, MD;  Location: [facility];  Service: Urology;  Laterality: N/A;   CYSTOSCOPY WITH URETHRAL DILATATION N/A 08/02/2012   Procedure: CYSTOSCOPY WITH URETHRAL DILATATION, AN REMOVAL STAPLE FROM BLADDER NECK;  Surgeon: Bernestine Amass, MD;  Location[facility];  Service: Urology;  Laterality: N/A;   HEMORRHOID SURGERY  1990's   INCISIONAL HERNIA REPAIR N/A 04/23/2013   Procedure: LAPAROSCOPIC INCISIONAL HERNIA;  Surgeon: [name], MD;  Location: [facility];  Service: General;  Laterality: N/A;   INSERTION OF MESH N/A 04/23/2013   Procedure: INSERTION OF MESH;  Surgeon: [name], MD;  Location: [facility];  Service: General;  Laterality: N/A;   ROBOT ASSISTED LAPAROSCOPIC RADICAL PROSTATECTOMY  12/15/2011   Procedure: ROBOTIC ASSISTED LAPAROSCOPIC RADICAL PROSTATECTOMY;  Surgeon: Bernestine Amass, MD;  Location: [facility];  Service: Urology;  Laterality: N/A;   TOTAL THYROIDECTOMY  05-24-2003    Current Medications: Current Meds  Medication Sig  amLODipine (NORVASC) 5 MG tablet Take 1 tablet (5 mg total) by mouth daily.   azaTHIOprine (IMURAN) 50 MG tablet Take 1 tablet by mouth twice daily   glimepiride (AMARYL) 4 MG tablet Take 4 mg by mouth 2 (two) times daily.   levothyroxine (SYNTHROID) 150 MCG tablet Take 150 mcg by mouth daily.   losartan (COZAAR) 100 MG tablet Take 100 mg by mouth daily.   naproxen sodium (ANAPROX) 220 MG tablet Take 440 mg by mouth every morning.    omeprazole (PRILOSEC) 20 MG capsule Take 20 mg by mouth every evening.    ONE TOUCH ULTRA TEST test strip    ONETOUCH DELICA LANCETS 99991111 MISC    pyridostigmine (MESTINON) 60 MG tablet TAKE  1/2 (ONE-HALF) TABLET BY MOUTH EVERY 4 HOURS WHILE AWAKE   rosuvastatin (CRESTOR) 10 MG tablet Take 1 tablet (10 mg total) by mouth daily.   TOUJEO MAX SOLOSTAR 300 UNIT/ML SOPN Reports he is taking 18 units in the evening     Allergies:   Sulfa antibiotics   Social History   Socioeconomic History   Marital status: Married    Spouse name: Doris   Number of children: 1   Years of education: 10th   Highest education level: Not on file  Occupational History   Occupation: semi-retired  Tobacco Use   Smoking status: Never   Smokeless tobacco: Never  Vaping Use   Vaping Use: Never used  Substance and Sexual Activity   Alcohol use: No   Drug use: No   Sexual activity: Not on file  Other Topics Concern   Not on file  Social History Narrative   Lives at home with his wife.   Right-handed.   2-3 cups caffeine per day.   Social Determinants of Health   Financial Resource Strain: Not on file  Food Insecurity: Not on file  Transportation Needs: Not on file  Physical Activity: Not on file  Stress: Not on file  Social Connections: Not on file     Family History: The patient's family history includes Cancer in his brother; Colon cancer in his father; Diabetes in his brother, brother, and sister; Glaucoma in his mother; Heart disease in his mother; Hypertension in his mother.  ROS:   Please see the history of present illness.     All other systems reviewed and are negative.  EKGs/Labs/Other Studies Reviewed:    The following studies were reviewed today:   EKG:  EKG is ordered today.  The ekg ordered today demonstrates sinus bradycardia, rate 59, no ST abnormality  Recent Labs: 05/26/2020: ALT 20; BUN 13; Creatinine, Ser 0.75; Hemoglobin 13.7; Platelets 222; Potassium 4.4; Sodium 135  Recent Lipid Panel No results found for: CHOL, TRIG, HDL, CHOLHDL, VLDL, LDLCALC, LDLDIRECT  Physical Exam:    VS:  BP (!) 162/98 (BP Location: Left Arm, Patient Position: Sitting, Cuff  Size: Normal)   Pulse (!) 59   Ht '5\' 9"'$  (1.753 m)   Wt 176 lb 9.6 oz (80.1 kg)   SpO2 98%   BMI 26.08 kg/m     Wt Readings from Last 3 Encounters:  08/07/20 176 lb 9.6 oz (80.1 kg)  05/26/20 178 lb 9.6 oz (81 kg)  11/26/19 179 lb (81.2 kg)     GEN:  Well nourished, well developed in no acute distress HEENT: Normal NECK: No JVD; No carotid bruits LYMPHATICS: No lymphadenopathy CARDIAC: RRR, no murmurs, rubs, gallops RESPIRATORY:  Clear to auscultation without rales, wheezing or rhonchi  ABDOMEN:  Soft, non-tender, non-distended MUSCULOSKELETAL:  Trace edema; No deformity  SKIN: Warm and dry NEUROLOGIC:  Alert and oriented x 3 PSYCHIATRIC:  Normal affect   ASSESSMENT:    1. Precordial pain   2. Dyspnea on exertion   3. Essential hypertension   4. Hyperlipidemia, unspecified hyperlipidemia type    PLAN:    Chest pain/DOE: Chest pain atypical in description, but does have significant CAD risk factors (age, hypertension, hyperlipidemia, T2DM).  Also with dyspnea with minimal exertion, which could represent anginal equivalent. -Recommend coronary CTA to evaluate for obstructive CAD.  Resting heart rate in 50s, will not give metoprolol prior to study -Echocardiogram  Hypertension: On losartan 100 mg daily.  BP elevated, will add amlodipine 5 mg daily.  Asked patient to check BP daily for next 2 weeks and call with results.  Hyperlipidemia: LDL 122 on 08/04/2020.  Meets indication for statin given his diabetes history.  Reported intolerance to statin in past, will trial rosuvastatin 10 mg daily.  If unable to tolerate, recommend referral to lipd clinic for PCSK9 inhibitor  T2DM: On insulin.  A1c 8.9% on 08/04/2020.  Follows with PCP  OSA: unable to tolerate CPAP  RTC in 6 months   Medication Adjustments/Labs and Tests Ordered: Current medicines are reviewed at length with the patient today.  Concerns regarding medicines are outlined above.  Orders Placed This Encounter   Procedures   CT CORONARY MORPH W/CTA COR W/SCORE W/CA W/CM &/OR WO/CM   Basic metabolic panel   EKG [id number]   ECHOCARDIOGRAM COMPLETE    Meds ordered this encounter  Medications   rosuvastatin (CRESTOR) 10 MG tablet    Sig: Take 1 tablet (10 mg total) by mouth daily.    Dispense:  90 tablet    Refill:  3   amLODipine (NORVASC) 5 MG tablet    Sig: Take 1 tablet (5 mg total) by mouth daily.    Dispense:  180 tablet    Refill:  3     Patient Instructions  Medication Instructions:  START: AMLODIPINE '5mg'$  DAILY  START: CRESTOR (ROSUVASTATIN) '10mg'$  DAILY  *If you need a refill on your cardiac medications before your next appointment, please call your pharmacy*  Lab Work: [facility]- [place]  If you have labs (blood work) drawn today and your tests are completely normal, you will receive your results only by: [place] (if you have MyChart) OR A paper copy in the mail If you have any lab test that is abnormal or we need to change your treatment, we will call you to review the results.  Testing/Procedures: Your physician has requested that you have cardiac CT. Cardiac computed tomography (CT) is a painless test that uses an x-ray machine to take clear, detailed pictures of your heart. For further information please visit [email or web address].[email or web address]. Please follow instruction sheet as given.  Follow-Up: At [facility], you and your health needs are our priority.  As part of our continuing mission to provide you with exceptional heart care, we have created designated Provider Care Teams.  These Care Teams include your primary Cardiologist (physician) and Advanced Practice Providers (APPs -  Physician Assistants and Nurse Practitioners) who all work together to provide you with the care you need, when you need it.  We recommend signing up for the patient portal called "MyChart".  Sign up information is provided on this After Visit Summary.  MyChart is used to connect with  patients for Virtual Visits (Telemedicine).  Patients are able  to view lab/test results, encounter notes, upcoming appointments, etc.  Non-urgent messages can be sent to your provider as well.   To learn more about what you can do with MyChart, go to [email or web address].    Your next appointment:   6 month(s)  The format for your next appointment:   In Person  Provider:   Oswaldo Milian, MD  Other Instructions  Please check your blood pressure at home daily, write it down.  Call the office or send message via Mychart with the readings in 2 weeks for Dr. Gardiner Rhyme to review.     Your cardiac CT will be scheduled at one of the below locations:   [facility] [address] [place], [place] [address] [phone]  If scheduled at [facility], please arrive at the [facility] main entrance (entrance A) of [facility] 30 minutes prior to test start time. Proceed to the [facility] Radiology Department (first floor) to check-in and test prep.  Please follow these instructions carefully (unless otherwise directed):  On the Night Before the Test: Be sure to Drink plenty of water. Do not consume any caffeinated/decaffeinated beverages or chocolate 12 hours prior to your test. Do not take any antihistamines 12 hours prior to your test.  On the Day of the Test: Drink plenty of water until 1 hour prior to the test. Do not eat any food 4 hours prior to the test. You may take your regular medications prior to the test.  HOLD Furosemide/Hydrochlorothiazide morning of the test.  After the Test: Drink plenty of water. After receiving IV contrast, you may experience a mild flushed feeling. This is normal. On occasion, you may experience a mild rash up to 24 hours after the test. This is not dangerous. If this occurs, you can take Benadryl 25 mg and increase your fluid intake. If you experience trouble breathing, this can be serious. If it is severe call  911 IMMEDIATELY. If it is mild, please call our office. If you take any of these medications: Glipizide/Metformin, Avandament, Glucavance, please do not take 48 hours after completing test unless otherwise instructed.  Please allow 2-4 weeks for scheduling of routine cardiac CTs. Some insurance companies require a pre-authorization which may delay scheduling of this test.   For non-scheduling related questions, please contact the cardiac imaging nurse navigator should you have any questions/concerns: Marchia Bond, Cardiac Imaging Nurse Navigator Gordy Clement, Cardiac Imaging Nurse Navigator [facility] Direct Office Dial: [phone]   For scheduling needs, including cancellations and rescheduling, please call [place], [phone].    Signed, Donato Heinz, MD  08/07/2020 8:39 AM    [facility]

## 2020-08-07 ENCOUNTER — Ambulatory Visit: Payer: PPO | Admitting: Cardiology

## 2020-08-07 ENCOUNTER — Encounter: Payer: Self-pay | Admitting: Cardiology

## 2020-08-07 ENCOUNTER — Other Ambulatory Visit: Payer: Self-pay

## 2020-08-07 VITALS — BP 162/98 | HR 59 | Ht 69.0 in | Wt 176.6 lb

## 2020-08-07 DIAGNOSIS — R072 Precordial pain: Secondary | ICD-10-CM | POA: Diagnosis not present

## 2020-08-07 DIAGNOSIS — R0600 Dyspnea, unspecified: Secondary | ICD-10-CM

## 2020-08-07 DIAGNOSIS — I10 Essential (primary) hypertension: Secondary | ICD-10-CM | POA: Diagnosis not present

## 2020-08-07 DIAGNOSIS — E785 Hyperlipidemia, unspecified: Secondary | ICD-10-CM | POA: Diagnosis not present

## 2020-08-07 DIAGNOSIS — R0609 Other forms of dyspnea: Secondary | ICD-10-CM

## 2020-08-07 MED ORDER — ROSUVASTATIN CALCIUM 10 MG PO TABS: 10.0000 mg | ORAL_TABLET | Freq: Every day | ORAL | 3 refills | Status: DC

## 2020-08-07 MED ORDER — AMLODIPINE BESYLATE 5 MG PO TABS: 5.0000 mg | ORAL_TABLET | Freq: Every day | ORAL | 3 refills | Status: DC

## 2020-08-07 NOTE — Telephone Encounter (Signed)
This encounter was created in error - please disregard.

## 2020-08-07 NOTE — Patient Instructions (Addendum)
Medication Instructions:  START: AMLODIPINE '5mg'$  DAILY  START: CRESTOR (ROSUVASTATIN) '10mg'$  DAILY  *If you need a refill on your cardiac medications before your next appointment, please call your pharmacy*  Lab Work: [facility]- [place]  If you have labs (blood work) drawn today and your tests are completely normal, you will receive your results only by: [place] (if you have MyChart) OR A paper copy in the mail If you have any lab test that is abnormal or we need to change your treatment, we will call you to review the results.  Testing/Procedures: Your physician has requested that you have cardiac CT. Cardiac computed tomography (CT) is a painless test that uses an x-ray machine to take clear, detailed pictures of your heart. For further information please visit [email or web address].tn. Please follow instruction sheet as given.  Your physician has requested that you have an echocardiogram. Echocardiography is a painless test that uses sound waves to create images of your heart. It provides your doctor with information about the size and shape of your heart and how well your heart's chambers and valves are working. You may receive an ultrasound enhancing agent through an IV if needed to better visualize your heart during the echo.This procedure takes approximately one hour. There are no restrictions for this procedure. This will take place at the [address].   Follow-Up: At [facility], you and your health needs are our priority.  As part of our continuing mission to provide you with exceptional heart care, we have created designated Provider Care Teams.  These Care Teams include your primary Cardiologist (physician) and Advanced Practice Providers (APPs -  Physician Assistants and Nurse Practitioners) who all work together to provide you with the care you need, when you need it.  We recommend signing up for the patient portal called "MyChart".  Sign up  information is provided on this After Visit Summary.  MyChart is used to connect with patients for Virtual Visits (Telemedicine).  Patients are able to view lab/test results, encounter notes, upcoming appointments, etc.  Non-urgent messages can be sent to your provider as well.   To learn more about what you can do with MyChart, go to [email or web address].    Your next appointment:   6 month(s)  The format for your next appointment:   In Person  Provider:   Oswaldo Milian, MD  Other Instructions  Please check your blood pressure at home daily, write it down.  Call the office or send message via Mychart with the readings in 2 weeks for Dr. Gardiner Rhyme to review.     Your cardiac CT will be scheduled at one of the below locations:   [facility] [address] [place], [place] [address] [phone]  If scheduled at [facility], please arrive at the [facility] main entrance (entrance A) of [facility] 30 minutes prior to test start time. Proceed to the [facility] Radiology Department (first floor) to check-in and test prep.  Please follow these instructions carefully (unless otherwise directed):  On the Night Before the Test: Be sure to Drink plenty of water. Do not consume any caffeinated/decaffeinated beverages or chocolate 12 hours prior to your test. Do not take any antihistamines 12 hours prior to your test.  On the Day of the Test: Drink plenty of water until 1 hour prior to the test. Do not eat any food 4 hours prior to the test. You may take your regular  medications prior to the test.  HOLD Furosemide/Hydrochlorothiazide morning of the test.  After the Test: Drink plenty of water. After receiving IV contrast, you may experience a mild flushed feeling. This is normal. On occasion, you may experience a mild rash up to 24 hours after the test. This is not dangerous. If this occurs, you can take Benadryl 25 mg and increase your fluid  intake. If you experience trouble breathing, this can be serious. If it is severe call 911 IMMEDIATELY. If it is mild, please call our office. If you take any of these medications: Glipizide/Metformin, Avandament, Glucavance, please do not take 48 hours after completing test unless otherwise instructed.  Please allow 2-4 weeks for scheduling of routine cardiac CTs. Some insurance companies require a pre-authorization which may delay scheduling of this test.   For non-scheduling related questions, please contact the cardiac imaging nurse navigator should you have any questions/concerns: Marchia Bond, Cardiac Imaging Nurse Navigator Gordy Clement, Cardiac Imaging Nurse Navigator [place] [facility] Direct Office Dial: [phone]   For scheduling needs, including cancellations and rescheduling, please call [place], [phone].

## 2020-08-11 DIAGNOSIS — R072 Precordial pain: Secondary | ICD-10-CM | POA: Diagnosis not present

## 2020-08-11 DIAGNOSIS — R0600 Dyspnea, unspecified: Secondary | ICD-10-CM | POA: Diagnosis not present

## 2020-08-12 LAB — BASIC METABOLIC PANEL
BUN/Creatinine Ratio: 20 (ref 10–24)
BUN: 18 mg/dL (ref 8–27)
CO2: 17 mmol/L — ABNORMAL LOW (ref 20–29)
Calcium: 9.8 mg/dL (ref 8.6–10.2)
Chloride: 102 mmol/L (ref 96–106)
Creatinine, Ser: 0.92 mg/dL (ref 0.76–1.27)
Glucose: 209 mg/dL — ABNORMAL HIGH (ref 65–99)
Potassium: 4.7 mmol/L (ref 3.5–5.2)
Sodium: 139 mmol/L (ref 134–144)
eGFR: 87 mL/min/{1.73_m2} (ref >59)

## 2020-08-13 ENCOUNTER — Telehealth (HOSPITAL_COMMUNITY): Payer: Self-pay | Admitting: Emergency Medicine

## 2020-08-13 NOTE — Telephone Encounter (Signed)
Reaching out to patient to offer assistance regarding upcoming cardiac imaging study; pt verbalizes understanding of appt date/time, parking situation and where to check in, pre-test NPO status and medications ordered, and verified current allergies; name and call back number provided for further questions should they arise Marchia Bond RN [place] [facility] [phone] office [phone] cell  Some claustro Denies iv issues Daily meds per usual

## 2020-08-14 ENCOUNTER — Other Ambulatory Visit: Payer: Self-pay

## 2020-08-14 ENCOUNTER — Ambulatory Visit (HOSPITAL_BASED_OUTPATIENT_CLINIC_OR_DEPARTMENT_OTHER): Payer: PPO

## 2020-08-14 DIAGNOSIS — R072 Precordial pain: Secondary | ICD-10-CM | POA: Diagnosis not present

## 2020-08-14 DIAGNOSIS — R0600 Dyspnea, unspecified: Secondary | ICD-10-CM | POA: Diagnosis not present

## 2020-08-14 DIAGNOSIS — R0609 Other forms of dyspnea: Secondary | ICD-10-CM

## 2020-08-14 LAB — ECHOCARDIOGRAM COMPLETE
Area-P 1/2: 2.91 cm2
S' Lateral: 2.9 cm

## 2020-08-15 ENCOUNTER — Ambulatory Visit (HOSPITAL_COMMUNITY)
Admission: RE | Admit: 2020-08-15 | Discharge: 2020-08-15 | Disposition: A | Discharge status: Home / Self Care | Payer: PPO | Source: Ambulatory Visit | Attending: Cardiology | Admitting: Cardiology

## 2020-08-15 ENCOUNTER — Encounter: Payer: Self-pay | Admitting: *Deleted

## 2020-08-15 DIAGNOSIS — R072 Precordial pain: Secondary | ICD-10-CM | POA: Insufficient documentation

## 2020-08-15 DIAGNOSIS — I2510 Atherosclerotic heart disease of native coronary artery without angina pectoris: Secondary | ICD-10-CM | POA: Diagnosis not present

## 2020-08-15 DIAGNOSIS — R0600 Dyspnea, unspecified: Secondary | ICD-10-CM | POA: Insufficient documentation

## 2020-08-15 MED ORDER — NITROGLYCERIN 0.4 MG SL SUBL
0.8000 mg | SUBLINGUAL_TABLET | Freq: Once | SUBLINGUAL | Status: AC
  Administered 2020-08-15: 0.8 mg via SUBLINGUAL

## 2020-08-15 MED ORDER — NITROGLYCERIN 0.4 MG SL SUBL
SUBLINGUAL_TABLET | SUBLINGUAL | Status: AC
  Filled 2020-08-15: qty 2

## 2020-08-15 MED ORDER — IOHEXOL 350 MG/ML SOLN
100.0000 mL | Freq: Once | INTRAVENOUS | Status: AC | PRN
  Administered 2020-08-15: 100 mL via INTRAVENOUS

## 2020-08-18 NOTE — H&P (View-Only) (Signed)
Cardiology Office Note:    Date:  08/22/2020   ID:  Melton Alar, DOB 1945-07-19, MRN [id number]  PCP:  Cyndi Bender, PA-C  Cardiologist:  None  Electrophysiologist:  None   Referring MD: Cyndi Bender, PA-C   Chief Complaint  Patient presents with   Coronary Artery Disease      History of Present Illness:    Mario Burns is a 75 y.o. male with a hx of hypertension, myasthenia gravis, hypothyroidism, T2DM who presents for follow-up.  He was referred by Cyndi Bender, PA for evaluation of shortness of breath, initially seen on 08/07/2020.  He reports that he has been having chest pain and shortness of breath.  Reports had 2-3 episodes of chest pain over the last 2 weeks.  Reports pain is in center of his chest, sometimes sharp but sometimes pressure.  Often wakes up with it at night.  He does not exercise.  Reports has had chest pain with exertion but also can occur at rest.  Does report he gets short of breath with minimal exertion.  Reports chest pain can last for 15 minutes or so, but has lasted up to 1 hour.  Reports lightheadedness intermittently, denies any syncope.  Does report intermittent lower extremity edema.  Reports rare palpitations, none recently.  Does not check BP at home.  No smoking history.  Family history includes mother had heart failure, died at 38.  His sister sees a cardiologist, he is unsure of the details.  Echocardiogram 08/14/2020 showed EF 50 to XX123456, grade 1 diastolic dysfunction, normal RV function, no significant valvular disease, mild dilatation of ascending aorta measuring 41 mm.  Coronary CTA on 08/15/2020 showed calcium score 0, probable total occlusion of mid LAD, mild ascending aortic dilatation measuring 40 mm.  Also noted to have aortic sclerosis measuring 18 mm in T8 vertebral body.  Since last clinic visit, he reports he has been doing okay.  He developed swelling of his mouth after he started amlodipine and rosuvastatin, stopped both medications  and symptoms resolved.  Continues to have intermittent chest pain and shortness of breath.  Past Medical History:  Diagnosis Date   Arthritis    Bladder neck contracture    Borderline hyperlipidemia    Claustrophobia    Complication of anesthesia    CLAUSTROPHOBIC   GERD (gastroesophageal reflux disease)    H/O hiatal hernia    History of prostate cancer    S/P PROSTATECTOMY   History of thyroid nodule    LARGE GOITER   Hypertension    Hypothyroidism, postsurgical    MG (myasthenia gravis) ([place])    DX 2013 --  NEUROLOGIST--  DR WILLIS/  TREATED W/ MEDICATION UNTIL SYMPTOMS RESOLVED -- NO REMISSION--  SEES DR WILLIS PRN   OSA (obstructive sleep apnea)    NON-COMPLIANT CPAP--  CLAUSTROPHOBIC   Shortness of breath    ALWAYS--HAD FOR YEARS - STATES HE CAN'T DO HARDLY ANYTHING WITHOUT GIVING OUT   Type 2 diabetes mellitus (HCC)    Weak urinary stream     Past Surgical History:  Procedure Laterality Date   CYSTOSCOPY N/A 07/30/2013   Procedure: CYSTOSCOPY WITH Anastasio Champion ;  Surgeon: Bernestine Amass, MD;  [facility];  Service: Urology;  Laterality: N/A;   CYSTOSCOPY WITH URETHRAL DILATATION N/A 08/02/2012   Procedure: CYSTOSCOPY WITH URETHRAL DILATATION, AN REMOVAL STAPLE FROM BLADDER NECK;  Surgeon: Bernestine Amass, MD;  Location: [facility];  Service: Urology;  Laterality:  N/A;   HEMORRHOID SURGERY  1990's   INCISIONAL HERNIA REPAIR N/A 04/23/2013   Procedure: LAPAROSCOPIC INCISIONAL HERNIA;  Surgeon: [name], MD;  Location: [facility];  Service: General;  Laterality: N/A;   INSERTION OF MESH N/A 04/23/2013   Procedure: INSERTION OF MESH;  Surgeon: [name], MD;  Location: [facility];  Service: General;  Laterality: N/A;   ROBOT ASSISTED LAPAROSCOPIC RADICAL PROSTATECTOMY  12/15/2011   Procedure: ROBOTIC ASSISTED LAPAROSCOPIC RADICAL PROSTATECTOMY;  Surgeon: Bernestine Amass, MD;  Location: [facility];  Service: Urology;  Laterality:  N/A;   TOTAL THYROIDECTOMY  05-24-2003    Current Medications: Current Meds  Medication Sig   aspirin EC 81 MG tablet Take 1 tablet (81 mg total) by mouth daily. Swallow whole.   azaTHIOprine (IMURAN) 50 MG tablet Take 1 tablet by mouth twice daily   glimepiride (AMARYL) 4 MG tablet Take 4 mg by mouth 2 (two) times daily.   isosorbide mononitrate (IMDUR) 30 MG 24 hr tablet Take 1 tablet (30 mg total) by mouth daily.   levothyroxine (SYNTHROID) 150 MCG tablet Take 150 mcg by mouth daily before breakfast.   losartan (COZAAR) 100 MG tablet Take 100 mg by mouth daily.   nitroGLYCERIN (NITROSTAT) 0.4 MG SL tablet Place 1 tablet (0.4 mg total) under the tongue every 5 (five) minutes as needed.   omeprazole (PRILOSEC) 20 MG capsule Take 20 mg by mouth every evening.    ONE TOUCH ULTRA TEST test strip    ONETOUCH DELICA LANCETS 99991111 MISC    pyridostigmine (MESTINON) 60 MG tablet TAKE 1/2 (ONE-HALF) TABLET BY MOUTH EVERY 4 HOURS WHILE AWAKE (Patient taking differently: Take 30 mg by mouth in the morning and at bedtime.)   TOUJEO MAX SOLOSTAR 300 UNIT/ML SOPN Inject 18 Units into the skin at bedtime.   [DISCONTINUED] naproxen sodium (ANAPROX) 220 MG tablet Take 440 mg by mouth every morning.      Allergies:   Sulfa antibiotics   Social History   Socioeconomic History   Marital status: Married    Spouse name: Doris   Number of children: 1   Years of education: 10th   Highest education level: Not on file  Occupational History   Occupation: semi-retired  Tobacco Use   Smoking status: Never   Smokeless tobacco: Never  Vaping Use   Vaping Use: Never used  Substance and Sexual Activity   Alcohol use: No   Drug use: No   Sexual activity: Not on file  Other Topics Concern   Not on file  Social History Narrative   Lives at home with his wife.   Right-handed.   2-3 cups caffeine per day.   Social Determinants of Health   Financial Resource Strain: Not on file  Food Insecurity: Not on  file  Transportation Needs: Not on file  Physical Activity: Not on file  Stress: Not on file  Social Connections: Not on file     Family History: The patient's family history includes Cancer in his brother; Colon cancer in his father; Diabetes in his brother, brother, and sister; Glaucoma in his mother; Heart disease in his mother; Hypertension in his mother.  ROS:   Please see the history of present illness.     All other systems reviewed and are negative.  EKGs/Labs/Other Studies Reviewed:    The following studies were reviewed today:   EKG:  EKG is ordered today.  The ekg ordered today demonstrates sinus rhythm rate 60, no ST abnormality  Recent Labs: 05/26/2020: ALT 20 08/21/2020: BUN 15; Creatinine, Ser 0.75; Hemoglobin 13.8; Platelets 223; Potassium 4.6; Sodium 134  Recent Lipid Panel No results found for: CHOL, TRIG, HDL, CHOLHDL, VLDL, LDLCALC, LDLDIRECT  Physical Exam:    VS:  BP 122/86   Pulse 60   Ht '5\' 10"'$  (1.778 m)   Wt 176 lb 9.6 oz (80.1 kg)   SpO2 99%   BMI 25.34 kg/m     Wt Readings from Last 3 Encounters:  08/21/20 176 lb 9.6 oz (80.1 kg)  08/07/20 176 lb 9.6 oz (80.1 kg)  05/26/20 178 lb 9.6 oz (81 kg)     GEN:  Well nourished, well developed in no acute distress HEENT: Normal NECK: No JVD; No carotid bruits LYMPHATICS: No lymphadenopathy CARDIAC: RRR, no murmurs, rubs, gallops RESPIRATORY:  Clear to auscultation without rales, wheezing or rhonchi  ABDOMEN: Soft, non-tender, non-distended MUSCULOSKELETAL:  Trace edema; No deformity  SKIN: Warm and dry NEUROLOGIC:  Alert and oriented x 3 PSYCHIATRIC:  Normal affect   ASSESSMENT:    1. Abnormal cardiac CT angiography   2. Coronary artery disease of native artery of native heart with stable angina pectoris ([place])   3. Precordial pain   4. Dyspnea on exertion   5. Hyperlipidemia, unspecified hyperlipidemia type   6. History of prostate cancer     PLAN:    Chest pain/DOE: Chest pain  atypical in description, but does have significant CAD risk factors (age, hypertension, hyperlipidemia, T2DM).  Echocardiogram 08/14/2020 showed EF 50 to XX123456, grade 1 diastolic dysfunction, normal RV function, no significant valvular disease, mild dilatation of ascending aorta measuring 41 mm.  Coronary CTA on 08/15/2020 showed calcium score 0, probable total occlusion of mid LAD -Plan LHC to confirm CTO LAD.  Risks and benefits of cardiac catheterization have been discussed with the patient.  These include bleeding, infection, kidney damage, stroke, heart attack, death.  The patient understands these risks and is willing to proceed. -Start Imdur 30 mg daily -As needed sublingual nitroglycerin -Aspirin 81 mg daily -Referral to lipid clinic for PCSK9 inhibitor as unable to tolerate statins.  Hypertension: On losartan 100 mg daily.  Adding Imdur as above  Hyperlipidemia: LDL 122 on 08/04/2020.  Has been unable to tolerate statins, recommend referral to lipd clinic for PCSK9 inhibitor  T2DM: On insulin.  A1c 8.9% on 08/04/2020.  Follows with PCP  OSA: unable to tolerate CPAP  Spinal lesion: noted to have area of sclerosis measuring 18 mm in T8 vertebral body on coronary CTA 08/15/2020, does have history prostate cancer and concerning that this could represent metastatic disease.  Will check PSA and refer to oncology  Aortic dilatation: Mild ascending aortic dilatation measuring 40 mm on CTA 08/15/2020.  Plan repeat CTA chest in 1 year   RTC in 1 month   Medication Adjustments/Labs and Tests Ordered: Current medicines are reviewed at length with the patient today.  Concerns regarding medicines are outlined above.  Orders Placed This Encounter  Procedures   Basic metabolic panel   CBC   PSA   AMB Referral to [facility]   Ambulatory referral to Hematology / Oncology   EKG 12-Lead     Meds ordered this encounter  Medications   aspirin EC 81 MG tablet    Sig: Take 1 tablet  (81 mg total) by mouth daily. Swallow whole.    Dispense:  90 tablet    Refill:  3   nitroGLYCERIN (NITROSTAT) 0.4 MG SL tablet  Sig: Place 1 tablet (0.4 mg total) under the tongue every 5 (five) minutes as needed.    Dispense:  25 tablet    Refill:  3   isosorbide mononitrate (IMDUR) 30 MG 24 hr tablet    Sig: Take 1 tablet (30 mg total) by mouth daily.    Dispense:  90 tablet    Refill:  3      Patient Instructions   [place] [address] Dept: [phone] Loc: [place]  08/21/2020  You are scheduled for a Cardiac Catheterization on Tuesday, August 23 with Dr. Larae Grooms.  1. Please arrive at the [facility] (Main Entrance A) at [facility]: [address] [place], [place] [address] at 5:30 AM (This time is two hours before your procedure to ensure your preparation). Free valet parking service is available.   Special note: Every effort is made to have your procedure done on time. Please understand that emergencies sometimes delay scheduled procedures.  2. Diet: Do not eat solid foods after midnight.  The patient may have clear liquids until 5am upon the day of the procedure.  3. Labs: today in office  4. Medication instructions in preparation for your procedure:   Contrast Allergy: No  Take 1/2 of your normal dose of insulin the night before procedure NO insulin the AM of procedure  Hold glimepiride AM of procedure  On the morning of your procedure, take your Aspirin and any morning medicines NOT listed above.  You may use sips of water.  5. Plan for one night stay--bring personal belongings. 6. Bring a current list of your medications and current insurance cards. 7. You MUST have a responsible person to drive you home. 8. Someone MUST be with you the first 24 hours after you arrive home or your discharge will be  delayed. 9. Please wear clothes that are easy to get on and off and wear slip-on shoes.  Thank you for allowing [place] to care for you!   -- [place] [facility]   STOP naproxen-use tylenol for pain START aspirin 81 mg daily START isosorbide (Imdur) 30 mg daily  Take sublingual nitroglycerin AS NEEDED for chest pain  You have been referred to: Lipid clinic with pharmacist    Follow up with Dr. Gardiner Rhyme in 1 month    Signed, Donato Heinz, MD  08/22/2020 10:07 AM    [place]

## 2020-08-18 NOTE — Progress Notes (Signed)
Cardiology Office Note:    Date:  08/22/2020   ID:  Mario Burns, DOB 1945/11/30, MRN [id number]  PCP:  Mario Bender, PA-C  Cardiologist:  None  Electrophysiologist:  None   Referring MD: Mario Bender, PA-C   Chief Complaint  Patient presents with   Coronary Artery Disease      History of Present Illness:    Mario Burns is a 75 y.o. male with a hx of hypertension, myasthenia gravis, hypothyroidism, T2DM who presents for follow-up.  He was referred by Mario Bender, PA for evaluation of shortness of breath, initially seen on 08/07/2020.  He reports that he has been having chest pain and shortness of breath.  Reports had 2-3 episodes of chest pain over the last 2 weeks.  Reports pain is in center of his chest, sometimes sharp but sometimes pressure.  Often wakes up with it at night.  He does not exercise.  Reports has had chest pain with exertion but also can occur at rest.  Does report he gets short of breath with minimal exertion.  Reports chest pain can last for 15 minutes or so, but has lasted up to 1 hour.  Reports lightheadedness intermittently, denies any syncope.  Does report intermittent lower extremity edema.  Reports rare palpitations, none recently.  Does not check BP at home.  No smoking history.  Family history includes mother had heart failure, died at 92.  His sister sees a cardiologist, he is unsure of the details.  Echocardiogram 08/14/2020 showed EF 50 to XX123456, grade 1 diastolic dysfunction, normal RV function, no significant valvular disease, mild dilatation of ascending aorta measuring 41 mm.  Coronary CTA on 08/15/2020 showed calcium score 0, probable total occlusion of mid LAD, mild ascending aortic dilatation measuring 40 mm.  Also noted to have aortic sclerosis measuring 18 mm in T8 vertebral body.  Since last clinic visit, he reports he has been doing okay.  He developed swelling of his mouth after he started amlodipine and rosuvastatin, stopped both medications  and symptoms resolved.  Continues to have intermittent chest pain and shortness of breath.  Past Medical History:  Diagnosis Date   Arthritis    Bladder neck contracture    Borderline hyperlipidemia    Claustrophobia    Complication of anesthesia    CLAUSTROPHOBIC   GERD (gastroesophageal reflux disease)    H/O hiatal hernia    History of prostate cancer    S/P PROSTATECTOMY   History of thyroid nodule    LARGE GOITER   Hypertension    Hypothyroidism, postsurgical    MG (myasthenia gravis) (Mario Burns)    DX 2013 --  NEUROLOGIST--  Mario Burns/  TREATED W/ MEDICATION UNTIL SYMPTOMS RESOLVED -- NO REMISSION--  SEES Mario Burns PRN   OSA (obstructive sleep apnea)    NON-COMPLIANT CPAP--  CLAUSTROPHOBIC   Shortness of breath    ALWAYS--HAD FOR YEARS - STATES HE CAN'T DO HARDLY ANYTHING WITHOUT GIVING OUT   Type 2 diabetes mellitus (HCC)    Weak urinary stream     Past Surgical History:  Procedure Laterality Date   CYSTOSCOPY N/A 07/30/2013   Procedure: CYSTOSCOPY WITH Mario Burns ;  Surgeon: Mario Amass, MD;  Location: [facility];  Service: Urology;  Laterality: N/A;   CYSTOSCOPY WITH URETHRAL DILATATION N/A 08/02/2012   Procedure: CYSTOSCOPY WITH URETHRAL DILATATION, AN REMOVAL STAPLE FROM BLADDER NECK;  Surgeon: Mario Amass, MD;  Location: [facility];  Service: Urology;  Laterality:  N/A;   HEMORRHOID SURGERY  1990's   INCISIONAL HERNIA REPAIR N/A 04/23/2013   Procedure: LAPAROSCOPIC INCISIONAL HERNIA;  Surgeon: Mario Hector, MD;  Location: [facility];  Service: General;  Laterality: N/A;   INSERTION OF MESH N/A 04/23/2013   Procedure: INSERTION OF MESH;  Surgeon: Mario Hector, MD;  Location: [facility];  Service: General;  Laterality: N/A;   ROBOT ASSISTED LAPAROSCOPIC RADICAL PROSTATECTOMY  12/15/2011   Procedure: ROBOTIC ASSISTED LAPAROSCOPIC RADICAL PROSTATECTOMY;  Surgeon: Mario Amass, MD;  Location: [facility];  Service: Urology;  Laterality:  N/A;   TOTAL THYROIDECTOMY  05-24-2003    Current Medications: Current Meds  Medication Sig   aspirin EC 81 MG tablet Take 1 tablet (81 mg total) by mouth daily. Swallow whole.   azaTHIOprine (IMURAN) 50 MG tablet Take 1 tablet by mouth twice daily   glimepiride (AMARYL) 4 MG tablet Take 4 mg by mouth 2 (two) times daily.   isosorbide mononitrate (IMDUR) 30 MG 24 hr tablet Take 1 tablet (30 mg total) by mouth daily.   levothyroxine (SYNTHROID) 150 MCG tablet Take 150 mcg by mouth daily before breakfast.   losartan (COZAAR) 100 MG tablet Take 100 mg by mouth daily.   nitroGLYCERIN (NITROSTAT) 0.4 MG SL tablet Place 1 tablet (0.4 mg total) under the tongue every 5 (five) minutes as needed.   omeprazole (PRILOSEC) 20 MG capsule Take 20 mg by mouth every evening.    ONE TOUCH ULTRA TEST test strip    ONETOUCH DELICA LANCETS 99991111 MISC    pyridostigmine (MESTINON) 60 MG tablet TAKE 1/2 (ONE-HALF) TABLET BY MOUTH EVERY 4 HOURS WHILE AWAKE (Patient taking differently: Take 30 mg by mouth in the morning and at bedtime.)   TOUJEO MAX SOLOSTAR 300 UNIT/ML SOPN Inject 18 Units into the skin at bedtime.   [DISCONTINUED] naproxen sodium (ANAPROX) 220 MG tablet Take 440 mg by mouth every morning.      Allergies:   Sulfa antibiotics   Social History   Socioeconomic History   Marital status: Married    Spouse name: Mario Burns   Number of children: 1   Years of education: 10th   Highest education level: Not on file  Occupational History   Occupation: semi-retired  Tobacco Use   Smoking status: Never   Smokeless tobacco: Never  Vaping Use   Vaping Use: Never used  Substance and Sexual Activity   Alcohol use: No   Drug use: No   Sexual activity: Not on file  Other Topics Concern   Not on file  Social History Narrative   Lives at home with his wife.   Right-handed.   2-3 cups caffeine per day.   Social Determinants of Health   Financial Resource Strain: Not on file  Food Insecurity: Not on  file  Transportation Needs: Not on file  Physical Activity: Not on file  Stress: Not on file  Social Connections: Not on file     Family History: The patient's family history includes Cancer in his brother; Colon cancer in his father; Diabetes in his brother, brother, and sister; Glaucoma in his mother; Heart disease in his mother; Hypertension in his mother.  ROS:   Please see the history of present illness.     All other systems reviewed and are negative.  EKGs/Labs/Other Studies Reviewed:    The following studies were reviewed today:   EKG:  EKG is ordered today.  The ekg ordered today demonstrates sinus rhythm rate 60, no ST abnormality  Recent Labs: 05/26/2020: ALT 20 08/21/2020: BUN 15; Creatinine, Ser 0.75; Hemoglobin 13.8; Platelets 223; Potassium 4.6; Sodium 134  Recent Lipid Panel No results found for: CHOL, TRIG, HDL, CHOLHDL, VLDL, LDLCALC, LDLDIRECT  Physical Exam:    VS:  BP 122/86   Pulse 60   Ht '5\' 10"'$  (1.778 m)   Wt 176 lb 9.6 oz (80.1 kg)   SpO2 99%   BMI 25.34 kg/m     Wt Readings from Last 3 Encounters:  08/21/20 176 lb 9.6 oz (80.1 kg)  08/07/20 176 lb 9.6 oz (80.1 kg)  05/26/20 178 lb 9.6 oz (81 kg)     GEN:  Well nourished, well developed in no acute distress HEENT: Normal NECK: No JVD; No carotid bruits LYMPHATICS: No lymphadenopathy CARDIAC: RRR, no murmurs, rubs, gallops RESPIRATORY:  Clear to auscultation without rales, wheezing or rhonchi  ABDOMEN: Soft, non-tender, non-distended MUSCULOSKELETAL:  Trace edema; No deformity  SKIN: Warm and dry NEUROLOGIC:  Alert and oriented x 3 PSYCHIATRIC:  Normal affect   ASSESSMENT:    1. Abnormal cardiac CT angiography   2. Coronary artery disease of native artery of native heart with stable angina pectoris ([place])   3. Precordial pain   4. Dyspnea on exertion   5. Hyperlipidemia, unspecified hyperlipidemia type   6. History of prostate cancer     PLAN:    Chest pain/DOE: Chest pain  atypical in description, but does have significant CAD risk factors (age, hypertension, hyperlipidemia, T2DM).  Echocardiogram 08/14/2020 showed EF 50 to XX123456, grade 1 diastolic dysfunction, normal RV function, no significant valvular disease, mild dilatation of ascending aorta measuring 41 mm.  Coronary CTA on 08/15/2020 showed calcium score 0, probable total occlusion of mid LAD -Plan LHC to confirm CTO LAD.  Risks and benefits of cardiac catheterization have been discussed with the patient.  These include bleeding, infection, kidney damage, stroke, heart attack, death.  The patient understands these risks and is willing to proceed. -Start Imdur 30 mg daily -As needed sublingual nitroglycerin -Aspirin 81 mg daily -Referral to lipid clinic for PCSK9 inhibitor as unable to tolerate statins.  Hypertension: On losartan 100 mg daily.  Adding Imdur as above  Hyperlipidemia: LDL 122 on 08/04/2020.  Has been unable to tolerate statins, recommend referral to lipd clinic for PCSK9 inhibitor  T2DM: On insulin.  A1c 8.9% on 08/04/2020.  Follows with PCP  OSA: unable to tolerate CPAP  Spinal lesion: noted to have area of sclerosis measuring 18 mm in T8 vertebral body on coronary CTA 08/15/2020, does have history prostate cancer and concerning that this could represent metastatic disease.  Will check PSA and refer to oncology  Aortic dilatation: Mild ascending aortic dilatation measuring 40 mm on CTA 08/15/2020.  Plan repeat CTA chest in 1 year   RTC in 1 month   Medication Adjustments/Labs and Tests Ordered: Current medicines are reviewed at length with the patient today.  Concerns regarding medicines are outlined above.  Orders Placed This Encounter  Procedures   Basic metabolic panel   CBC   PSA   AMB Referral to [facility]   Ambulatory referral to Hematology / Oncology   EKG 12-Lead     Meds ordered this encounter  Medications   aspirin EC 81 MG tablet    Sig: Take 1 tablet  (81 mg total) by mouth daily. Swallow whole.    Dispense:  90 tablet    Refill:  3   nitroGLYCERIN (NITROSTAT) 0.4 MG SL tablet  Sig: Place 1 tablet (0.4 mg total) under the tongue every 5 (five) minutes as needed.    Dispense:  25 tablet    Refill:  3   isosorbide mononitrate (IMDUR) 30 MG 24 hr tablet    Sig: Take 1 tablet (30 mg total) by mouth daily.    Dispense:  90 tablet    Refill:  3      Patient Instructions   [place] [address] Dept: [phone] Loc: [place]  08/21/2020  You are scheduled for a Cardiac Catheterization on Tuesday, August 23 with Mario. Larae Grooms.  1. Please arrive at the [facility] (Main Entrance A) at [facility]: [address] [place], [place] [address] at 5:30 AM (This time is two hours before your procedure to ensure your preparation). Free valet parking service is available.   Special note: Every effort is made to have your procedure done on time. Please understand that emergencies sometimes delay scheduled procedures.  2. Diet: Do not eat solid foods after midnight.  The patient may have clear liquids until 5am upon the day of the procedure.  3. Labs: today in office  4. Medication instructions in preparation for your procedure:   Contrast Allergy: No  Take 1/2 of your normal dose of insulin the night before procedure NO insulin the AM of procedure  Hold glimepiride AM of procedure  On the morning of your procedure, take your Aspirin and any morning medicines NOT listed above.  You may use sips of water.  5. Plan for one night stay--bring personal belongings. 6. Bring a current list of your medications and current insurance cards. 7. You MUST have a responsible person to drive you home. 8. Someone MUST be with you the first 24 hours after you arrive home or your discharge will be  delayed. 9. Please wear clothes that are easy to get on and off and wear slip-on shoes.  Thank you for allowing [place] to care for you!   -- [place] [facility]   STOP naproxen-use tylenol for pain START aspirin 81 mg daily START isosorbide (Imdur) 30 mg daily  Take sublingual nitroglycerin AS NEEDED for chest pain  You have been referred to: Lipid clinic with pharmacist    Follow up with Mario. Gardiner Rhyme in 1 month    Signed, Donato Heinz, MD  08/22/2020 10:07 AM    [place]

## 2020-08-21 ENCOUNTER — Other Ambulatory Visit: Payer: Self-pay

## 2020-08-21 ENCOUNTER — Encounter: Payer: Self-pay | Admitting: Cardiology

## 2020-08-21 ENCOUNTER — Ambulatory Visit: Payer: PPO | Admitting: Cardiology

## 2020-08-21 VITALS — BP 122/86 | HR 60 | Ht 70.0 in | Wt 176.6 lb

## 2020-08-21 DIAGNOSIS — I25118 Atherosclerotic heart disease of native coronary artery with other forms of angina pectoris: Secondary | ICD-10-CM | POA: Diagnosis not present

## 2020-08-21 DIAGNOSIS — R072 Precordial pain: Secondary | ICD-10-CM | POA: Diagnosis not present

## 2020-08-21 DIAGNOSIS — R0609 Other forms of dyspnea: Secondary | ICD-10-CM

## 2020-08-21 DIAGNOSIS — R0600 Dyspnea, unspecified: Secondary | ICD-10-CM

## 2020-08-21 DIAGNOSIS — Z8546 Personal history of malignant neoplasm of prostate: Secondary | ICD-10-CM | POA: Diagnosis not present

## 2020-08-21 DIAGNOSIS — E785 Hyperlipidemia, unspecified: Secondary | ICD-10-CM | POA: Diagnosis not present

## 2020-08-21 DIAGNOSIS — R931 Abnormal findings on diagnostic imaging of heart and coronary circulation: Secondary | ICD-10-CM

## 2020-08-21 MED ORDER — ASPIRIN EC 81 MG PO TBEC: 81.0000 mg | DELAYED_RELEASE_TABLET | Freq: Every day | ORAL | 3 refills | Status: DC

## 2020-08-21 MED ORDER — ISOSORBIDE MONONITRATE ER 30 MG PO TB24: 30.0000 mg | ORAL_TABLET | Freq: Every day | ORAL | 3 refills | Status: DC

## 2020-08-21 MED ORDER — NITROGLYCERIN 0.4 MG SL SUBL: 0.4000 mg | SUBLINGUAL_TABLET | SUBLINGUAL | 3 refills | Status: DC | PRN

## 2020-08-21 NOTE — Patient Instructions (Addendum)
  [place] [address] Dept: [phone] Loc: [place]  08/21/2020  You are scheduled for a Cardiac Catheterization on Tuesday, August 23 with Dr. Larae Grooms.  1. Please arrive at the [facility] (Main Entrance A) at [facility]: [address] [place], [place] [address] at 5:30 AM (This time is two hours before your procedure to ensure your preparation). Free valet parking service is available.   Special note: Every effort is made to have your procedure done on time. Please understand that emergencies sometimes delay scheduled procedures.  2. Diet: Do not eat solid foods after midnight.  The patient may have clear liquids until 5am upon the day of the procedure.  3. Labs: today in office  4. Medication instructions in preparation for your procedure:   Contrast Allergy: No  Take 1/2 of your normal dose of insulin the night before procedure NO insulin the AM of procedure  Hold glimepiride AM of procedure  On the morning of your procedure, take your Aspirin and any morning medicines NOT listed above.  You may use sips of water.  5. Plan for one night stay--bring personal belongings. 6. Bring a current list of your medications and current insurance cards. 7. You MUST have a responsible person to drive you home. 8. Someone MUST be with you the first 24 hours after you arrive home or your discharge will be delayed. 9. Please wear clothes that are easy to get on and off and wear slip-on shoes.  Thank you for allowing [place] to care for you!   -- [facility]   STOP naproxen-use tylenol for pain START aspirin 81 mg daily START isosorbide (Imdur) 30 mg daily  Take sublingual nitroglycerin AS NEEDED for chest pain  You have been referred to: Lipid clinic with pharmacist    Follow up with Dr. Gardiner Rhyme in  1 month

## 2020-08-22 ENCOUNTER — Telehealth: Payer: Self-pay | Admitting: Hematology

## 2020-08-22 LAB — CBC
Hematocrit: 41.9 % (ref 37.5–51.0)
Hemoglobin: 13.8 g/dL (ref 13.0–17.7)
MCH: 30.8 pg (ref 26.6–33.0)
MCHC: 32.9 g/dL (ref 31.5–35.7)
MCV: 94 fL (ref 79–97)
Platelets: 223 10*3/uL (ref 150–450)
RBC: 4.48 x10E6/uL (ref 4.14–5.80)
RDW: 13.3 % (ref 11.6–15.4)
WBC: 5.5 10*3/uL (ref 3.4–10.8)

## 2020-08-22 LAB — BASIC METABOLIC PANEL
BUN/Creatinine Ratio: 20 (ref 10–24)
BUN: 15 mg/dL (ref 8–27)
CO2: 22 mmol/L (ref 20–29)
Calcium: 9.6 mg/dL (ref 8.6–10.2)
Chloride: 100 mmol/L (ref 96–106)
Creatinine, Ser: 0.75 mg/dL — ABNORMAL LOW (ref 0.76–1.27)
Glucose: 200 mg/dL — ABNORMAL HIGH (ref 65–99)
Potassium: 4.6 mmol/L (ref 3.5–5.2)
Sodium: 134 mmol/L (ref 134–144)
eGFR: 94 mL/min/{1.73_m2} (ref >59)

## 2020-08-22 LAB — PSA: Prostate Specific Ag, Serum: <0.1 ng/mL (ref 0.0–4.0)

## 2020-08-22 NOTE — Telephone Encounter (Signed)
Received a new pt referral from Dr. Gardiner Rhyme for spine lesion. [name] has been scheduled to see Dr. Irene Limbo on 8/31 at 1pm. Appt date and time has been given to the pt's wife. Aware to arrive 20 minutes early.

## 2020-08-25 ENCOUNTER — Telehealth: Payer: Self-pay | Admitting: *Deleted

## 2020-08-25 NOTE — Telephone Encounter (Signed)
Cardiac catheterization scheduled at [facility] for: Tuesday August 26, 2020 7:30 [facility] Main Entrance A [facility]) at: 5:30 AM   No solid food after midnight prior to cath, clear liquids until 5 AM day of procedure.  Medication instructions: Hold: -Glimepiride-AM of procedure -1/2 usual Insulin dose HS prior to procedure  Except hold medications morning medications can be taken pre-cath with sips of water including aspirin 81 mg.    Confirmed patient has responsible adult to drive home post procedure and be with patient first 24 hours after arriving home.  Patients are allowed one visitor in the waiting room during the time they are at the hospital for their procedure. Both patient and visitor must wear a mask once they enter the hospital.   Patient reports does not currently have any symptoms concerning for COVID-19 and no household members with COVID-19 like illness.      Reviewed procedure/mask/visitor instructions with patient's wife (DPR), Doris.

## 2020-08-26 ENCOUNTER — Ambulatory Visit (HOSPITAL_COMMUNITY)
Admission: RE | Admit: 2020-08-26 | Discharge: 2020-08-26 | Disposition: A | Discharge status: Home / Self Care | Payer: PPO | Attending: Interventional Cardiology | Admitting: Interventional Cardiology

## 2020-08-26 ENCOUNTER — Encounter (HOSPITAL_COMMUNITY)
Admission: RE | Disposition: A | Discharge status: Home / Self Care | Payer: Self-pay | Source: Home / Self Care | Attending: Interventional Cardiology

## 2020-08-26 ENCOUNTER — Encounter (HOSPITAL_COMMUNITY): Payer: Self-pay | Admitting: Interventional Cardiology

## 2020-08-26 ENCOUNTER — Other Ambulatory Visit: Payer: Self-pay

## 2020-08-26 DIAGNOSIS — G4733 Obstructive sleep apnea (adult) (pediatric): Secondary | ICD-10-CM | POA: Insufficient documentation

## 2020-08-26 DIAGNOSIS — Z794 Long term (current) use of insulin: Secondary | ICD-10-CM | POA: Diagnosis not present

## 2020-08-26 DIAGNOSIS — Z79890 Hormone replacement therapy (postmenopausal): Secondary | ICD-10-CM | POA: Diagnosis not present

## 2020-08-26 DIAGNOSIS — I25118 Atherosclerotic heart disease of native coronary artery with other forms of angina pectoris: Secondary | ICD-10-CM | POA: Insufficient documentation

## 2020-08-26 DIAGNOSIS — I10 Essential (primary) hypertension: Secondary | ICD-10-CM | POA: Insufficient documentation

## 2020-08-26 DIAGNOSIS — I2582 Chronic total occlusion of coronary artery: Secondary | ICD-10-CM | POA: Insufficient documentation

## 2020-08-26 DIAGNOSIS — Z882 Allergy status to sulfonamides status: Secondary | ICD-10-CM | POA: Diagnosis not present

## 2020-08-26 DIAGNOSIS — I2510 Atherosclerotic heart disease of native coronary artery without angina pectoris: Secondary | ICD-10-CM

## 2020-08-26 DIAGNOSIS — E785 Hyperlipidemia, unspecified: Secondary | ICD-10-CM | POA: Insufficient documentation

## 2020-08-26 DIAGNOSIS — Z7982 Long term (current) use of aspirin: Secondary | ICD-10-CM | POA: Insufficient documentation

## 2020-08-26 DIAGNOSIS — Z8249 Family history of ischemic heart disease and other diseases of the circulatory system: Secondary | ICD-10-CM | POA: Insufficient documentation

## 2020-08-26 DIAGNOSIS — E119 Type 2 diabetes mellitus without complications: Secondary | ICD-10-CM | POA: Diagnosis not present

## 2020-08-26 DIAGNOSIS — Z79899 Other long term (current) drug therapy: Secondary | ICD-10-CM | POA: Insufficient documentation

## 2020-08-26 DIAGNOSIS — R0609 Other forms of dyspnea: Secondary | ICD-10-CM | POA: Insufficient documentation

## 2020-08-26 DIAGNOSIS — G7000 Myasthenia gravis without (acute) exacerbation: Secondary | ICD-10-CM | POA: Insufficient documentation

## 2020-08-26 DIAGNOSIS — Z7984 Long term (current) use of oral hypoglycemic drugs: Secondary | ICD-10-CM | POA: Insufficient documentation

## 2020-08-26 DIAGNOSIS — Z8546 Personal history of malignant neoplasm of prostate: Secondary | ICD-10-CM | POA: Insufficient documentation

## 2020-08-26 DIAGNOSIS — R931 Abnormal findings on diagnostic imaging of heart and coronary circulation: Secondary | ICD-10-CM

## 2020-08-26 DIAGNOSIS — Z833 Family history of diabetes mellitus: Secondary | ICD-10-CM | POA: Insufficient documentation

## 2020-08-26 HISTORY — PX: LEFT HEART CATH AND CORONARY ANGIOGRAPHY: CATH118249

## 2020-08-26 LAB — GLUCOSE, CAPILLARY: Glucose-Capillary: 114 mg/dL — ABNORMAL HIGH (ref 70–99)

## 2020-08-26 SURGERY — LEFT HEART CATH AND CORONARY ANGIOGRAPHY
Anesthesia: LOCAL

## 2020-08-26 MED ORDER — IOHEXOL 350 MG/ML SOLN
INTRAVENOUS | Status: DC | PRN
  Administered 2020-08-26: 50 mL

## 2020-08-26 MED ORDER — SODIUM CHLORIDE 0.9 % WEIGHT BASED INFUSION: 1.0000 mL/kg/h | INTRAVENOUS | Status: DC

## 2020-08-26 MED ORDER — SODIUM CHLORIDE 0.9% FLUSH: 3.0000 mL | Freq: Two times a day (BID) | INTRAVENOUS | Status: DC

## 2020-08-26 MED ORDER — SODIUM CHLORIDE 0.9 % IV SOLN: 250.0000 mL | INTRAVENOUS | Status: DC | PRN

## 2020-08-26 MED ORDER — ONDANSETRON HCL 4 MG/2ML IJ SOLN
INTRAMUSCULAR | Status: DC | PRN
  Administered 2020-08-26: 4 mg via INTRAVENOUS

## 2020-08-26 MED ORDER — VERAPAMIL HCL 2.5 MG/ML IV SOLN
INTRAVENOUS | Status: DC | PRN
  Administered 2020-08-26: 10 mL via INTRA_ARTERIAL

## 2020-08-26 MED ORDER — MIDAZOLAM HCL 2 MG/2ML IJ SOLN
INTRAMUSCULAR | Status: AC
  Filled 2020-08-26: qty 2

## 2020-08-26 MED ORDER — SODIUM CHLORIDE 0.9% FLUSH: 3.0000 mL | INTRAVENOUS | Status: DC | PRN

## 2020-08-26 MED ORDER — LABETALOL HCL 5 MG/ML IV SOLN: 10.0000 mg | INTRAVENOUS | Status: DC | PRN

## 2020-08-26 MED ORDER — ACETAMINOPHEN 325 MG PO TABS: 650.0000 mg | ORAL_TABLET | ORAL | Status: DC | PRN

## 2020-08-26 MED ORDER — FENTANYL CITRATE (PF) 100 MCG/2ML IJ SOLN
INTRAMUSCULAR | Status: DC | PRN
  Administered 2020-08-26: 25 ug via INTRAVENOUS

## 2020-08-26 MED ORDER — VERAPAMIL HCL 2.5 MG/ML IV SOLN
INTRAVENOUS | Status: AC
  Filled 2020-08-26: qty 2

## 2020-08-26 MED ORDER — ONDANSETRON HCL 4 MG/2ML IJ SOLN: 4.0000 mg | Freq: Four times a day (QID) | INTRAMUSCULAR | Status: DC | PRN

## 2020-08-26 MED ORDER — HEPARIN (PORCINE) IN NACL 1000-0.9 UT/500ML-% IV SOLN
INTRAVENOUS | Status: DC | PRN
  Administered 2020-08-26 (×2): 500 mL

## 2020-08-26 MED ORDER — LIDOCAINE HCL (PF) 1 % IJ SOLN
INTRAMUSCULAR | Status: DC | PRN
  Administered 2020-08-26: 2 mL

## 2020-08-26 MED ORDER — HYDRALAZINE HCL 20 MG/ML IJ SOLN: 10.0000 mg | INTRAMUSCULAR | Status: DC | PRN

## 2020-08-26 MED ORDER — SODIUM CHLORIDE 0.9 % IV SOLN: INTRAVENOUS | Status: AC

## 2020-08-26 MED ORDER — LIDOCAINE HCL (PF) 1 % IJ SOLN
INTRAMUSCULAR | Status: AC
  Filled 2020-08-26: qty 30

## 2020-08-26 MED ORDER — ASPIRIN 81 MG PO CHEW: 81.0000 mg | CHEWABLE_TABLET | ORAL | Status: AC

## 2020-08-26 MED ORDER — MIDAZOLAM HCL 2 MG/2ML IJ SOLN
INTRAMUSCULAR | Status: DC | PRN
  Administered 2020-08-26: 2 mg via INTRAVENOUS

## 2020-08-26 MED ORDER — HEPARIN SODIUM (PORCINE) 1000 UNIT/ML IJ SOLN
INTRAMUSCULAR | Status: DC | PRN
  Administered 2020-08-26: 4000 [IU] via INTRAVENOUS

## 2020-08-26 MED ORDER — FENTANYL CITRATE (PF) 100 MCG/2ML IJ SOLN
INTRAMUSCULAR | Status: AC
  Filled 2020-08-26: qty 2

## 2020-08-26 MED ORDER — SODIUM CHLORIDE 0.9 % WEIGHT BASED INFUSION
3.0000 mL/kg/h | INTRAVENOUS | Status: AC
  Administered 2020-08-26: 3 mL/kg/h via INTRAVENOUS

## 2020-08-26 SURGICAL SUPPLY — 9 items

## 2020-08-26 NOTE — Interval H&P Note (Signed)
Cath Lab Visit (complete for each Cath Lab visit)  Clinical Evaluation Leading to the Procedure:   ACS: No.  Non-ACS:    Anginal Classification: CCS II  Anti-ischemic medical therapy: Minimal Therapy (1 class of medications)  Non-Invasive Test Results: Intermediate-risk stress test findings: cardiac mortality 1-3%/year  Prior CABG: No previous CABG   CTO of LAD by CTA   History and Physical Interval Note:  08/26/2020 7:37 AM  Mario Burns  has presented today for surgery, with the diagnosis of positive coronary CT, chest pain.  The various methods of treatment have been discussed with the patient and family. After consideration of risks, benefits and other options for treatment, the patient has consented to  Procedure(s): LEFT HEART CATH AND CORONARY ANGIOGRAPHY (N/A) as a surgical intervention.  The patient's history has been reviewed, patient examined, no change in status, stable for surgery.  I have reviewed the patient's chart and labs.  Questions were answered to the patient's satisfaction.     Larae Grooms

## 2020-08-26 NOTE — Progress Notes (Signed)
Discharge instructions reviewed with pt and his wife both voice understanding.

## 2020-08-26 NOTE — Discharge Instructions (Signed)
Radial Site Care  This sheet gives you information about how to care for yourself after your procedure. Your health care provider may also give you more specific instructions. If you have problems or questions, contact your health care provider. What can I expect after the procedure? After the procedure, it is common to have: Bruising and tenderness at the catheter insertion area. Follow these instructions at home: Medicines Take over-the-counter and prescription medicines only as told by your health care provider. Insertion site care Follow instructions from your health care provider about how to take care of your insertion site. Make sure you: Wash your hands with soap and water before you remove your bandage (dressing). If soap and water are not available, use hand sanitizer. May remove dressing in 24 hours. Check your insertion site every day for signs of infection. Check for: Redness, swelling, or pain. Fluid or blood. Pus or a bad smell. Warmth. Do no take baths, swim, or use a hot tub for 5 days. You may shower 24-48 hours after the procedure. Remove the dressing and gently wash the site with plain soap and water. Pat the area dry with a clean towel. Do not rub the site. That could cause bleeding. Do not apply powder or lotion to the site. Activity  For 24 hours after the procedure, or as directed by your health care provider: Do not flex or bend the affected arm. Do not push or pull heavy objects with the affected arm. Do not drive yourself home from the hospital or clinic. You may drive 24 hours after the procedure. Do not operate machinery or power tools. KEEP ARM ELEVATED THE REMAINDER OF THE DAY. Do not push, pull or lift anything that is heavier than 10 lb for 5 days. Ask your health care provider when it is okay to: Return to work or school. Resume usual physical activities or sports. Resume sexual activity. General instructions If the catheter site starts to  bleed, raise your arm and put firm pressure on the site. If the bleeding does not stop, get help right away. This is a medical emergency. DRINK PLENTY OF FLUIDS FOR THE NEXT 2-3 DAYS. No alcohol consumption for 24 hours after receiving sedation. If you went home on the same day as your procedure, a responsible adult should be with you for the first 24 hours after you arrive home. Keep all follow-up visits as told by your health care provider. This is important. Contact a health care provider if: You have a fever. You have redness, swelling, or yellow drainage around your insertion site. Get help right away if: You have unusual pain at the radial site. The catheter insertion area swells very fast. The insertion area is bleeding, and the bleeding does not stop when you hold steady pressure on the area. Your arm or hand becomes pale, cool, tingly, or numb. These symptoms may represent a serious problem that is an emergency. Do not wait to see if the symptoms will go away. Get medical help right away. Call your local emergency services (911 in the [place] Do not drive yourself to the hospital. Summary After the procedure, it is common to have bruising and tenderness at the site. Follow instructions from your health care provider about how to take care of your radial site wound. Check the wound every day for signs of infection.  This information is not intended to replace advice given to you by your health care provider. Make sure you discuss any questions you have with   your health care provider. Document Revised: 01/26/2017 Document Reviewed: 01/26/2017 Elsevier Patient Education  2020 [employer]  

## 2020-08-26 NOTE — Progress Notes (Addendum)
Ambulated to the bathroom to void and in the hallway tol well. No bleeding noted before or after ambulation. Arm board applied to right arm

## 2020-09-03 ENCOUNTER — Other Ambulatory Visit: Payer: Self-pay

## 2020-09-03 ENCOUNTER — Telehealth: Payer: Self-pay | Admitting: Neurology

## 2020-09-03 ENCOUNTER — Inpatient Hospital Stay: Payer: PPO

## 2020-09-03 ENCOUNTER — Inpatient Hospital Stay: Payer: PPO | Attending: Hematology | Admitting: Hematology

## 2020-09-03 VITALS — BP 134/73 | HR 73 | Temp 99.0°F | Resp 20 | Wt 176.4 lb

## 2020-09-03 DIAGNOSIS — Z833 Family history of diabetes mellitus: Secondary | ICD-10-CM | POA: Insufficient documentation

## 2020-09-03 DIAGNOSIS — I371 Nonrheumatic pulmonary valve insufficiency: Secondary | ICD-10-CM | POA: Diagnosis not present

## 2020-09-03 DIAGNOSIS — R202 Paresthesia of skin: Secondary | ICD-10-CM | POA: Insufficient documentation

## 2020-09-03 DIAGNOSIS — I10 Essential (primary) hypertension: Secondary | ICD-10-CM | POA: Diagnosis not present

## 2020-09-03 DIAGNOSIS — E119 Type 2 diabetes mellitus without complications: Secondary | ICD-10-CM | POA: Diagnosis not present

## 2020-09-03 DIAGNOSIS — Z8719 Personal history of other diseases of the digestive system: Secondary | ICD-10-CM | POA: Diagnosis not present

## 2020-09-03 DIAGNOSIS — Z8546 Personal history of malignant neoplasm of prostate: Secondary | ICD-10-CM

## 2020-09-03 DIAGNOSIS — E785 Hyperlipidemia, unspecified: Secondary | ICD-10-CM | POA: Diagnosis not present

## 2020-09-03 DIAGNOSIS — I2510 Atherosclerotic heart disease of native coronary artery without angina pectoris: Secondary | ICD-10-CM | POA: Diagnosis not present

## 2020-09-03 DIAGNOSIS — G7000 Myasthenia gravis without (acute) exacerbation: Secondary | ICD-10-CM | POA: Insufficient documentation

## 2020-09-03 DIAGNOSIS — M899 Disorder of bone, unspecified: Secondary | ICD-10-CM | POA: Insufficient documentation

## 2020-09-03 DIAGNOSIS — C7951 Secondary malignant neoplasm of bone: Secondary | ICD-10-CM

## 2020-09-03 DIAGNOSIS — Z800 Family history of malignant neoplasm of digestive organs: Secondary | ICD-10-CM | POA: Insufficient documentation

## 2020-09-03 DIAGNOSIS — Z79899 Other long term (current) drug therapy: Secondary | ICD-10-CM | POA: Diagnosis not present

## 2020-09-03 DIAGNOSIS — Z882 Allergy status to sulfonamides status: Secondary | ICD-10-CM | POA: Insufficient documentation

## 2020-09-03 DIAGNOSIS — Z9079 Acquired absence of other genital organ(s): Secondary | ICD-10-CM | POA: Insufficient documentation

## 2020-09-03 DIAGNOSIS — R200 Anesthesia of skin: Secondary | ICD-10-CM | POA: Insufficient documentation

## 2020-09-03 DIAGNOSIS — Z83511 Family history of glaucoma: Secondary | ICD-10-CM | POA: Diagnosis not present

## 2020-09-03 DIAGNOSIS — Z8249 Family history of ischemic heart disease and other diseases of the circulatory system: Secondary | ICD-10-CM | POA: Diagnosis not present

## 2020-09-03 DIAGNOSIS — K449 Diaphragmatic hernia without obstruction or gangrene: Secondary | ICD-10-CM | POA: Insufficient documentation

## 2020-09-03 DIAGNOSIS — Z809 Family history of malignant neoplasm, unspecified: Secondary | ICD-10-CM | POA: Diagnosis not present

## 2020-09-03 LAB — CBC WITH DIFFERENTIAL (CANCER CENTER ONLY)
Abs Immature Granulocytes: 0.02 10*3/uL (ref 0.00–0.07)
Basophils Absolute: 0 10*3/uL (ref 0.0–0.1)
Basophils Relative: 1 %
Eosinophils Absolute: 0.1 10*3/uL (ref 0.0–0.5)
Eosinophils Relative: 1 %
HCT: 40.9 % (ref 39.0–52.0)
Hemoglobin: 13.9 g/dL (ref 13.0–17.0)
Immature Granulocytes: 0 %
Lymphocytes Relative: 26 %
Lymphs Abs: 1.6 10*3/uL (ref 0.7–4.0)
MCH: 31.1 pg (ref 26.0–34.0)
MCHC: 34 g/dL (ref 30.0–36.0)
MCV: 91.5 fL (ref 80.0–100.0)
Monocytes Absolute: 0.6 10*3/uL (ref 0.1–1.0)
Monocytes Relative: 9 %
Neutro Abs: 3.9 10*3/uL (ref 1.7–7.7)
Neutrophils Relative %: 63 %
Platelet Count: 249 10*3/uL (ref 150–400)
RBC: 4.47 MIL/uL (ref 4.22–5.81)
RDW: 13 % (ref 11.5–15.5)
WBC Count: 6.2 10*3/uL (ref 4.0–10.5)
nRBC: 0 % (ref 0.0–0.2)

## 2020-09-03 LAB — CMP (CANCER CENTER ONLY)
ALT: 32 U/L (ref 0–44)
AST: 23 U/L (ref 15–41)
Albumin: 3.9 g/dL (ref 3.5–5.0)
Alkaline Phosphatase: 88 U/L (ref 38–126)
Anion gap: 9 (ref 5–15)
BUN: 19 mg/dL (ref 8–23)
CO2: 23 mmol/L (ref 22–32)
Calcium: 9.7 mg/dL (ref 8.9–10.3)
Chloride: 105 mmol/L (ref 98–111)
Creatinine: 0.86 mg/dL (ref 0.61–1.24)
GFR, Estimated: >60 mL/min (ref >60)
Glucose, Bld: 135 mg/dL — ABNORMAL HIGH (ref 70–99)
Potassium: 4.1 mmol/L (ref 3.5–5.1)
Sodium: 137 mmol/L (ref 135–145)
Total Bilirubin: 0.9 mg/dL (ref 0.3–1.2)
Total Protein: 7.2 g/dL (ref 6.5–8.1)

## 2020-09-03 LAB — TSH: TSH: 0.747 u[IU]/mL (ref 0.320–4.118)

## 2020-09-03 LAB — VITAMIN D 25 HYDROXY (VIT D DEFICIENCY, FRACTURES): Vit D, 25-Hydroxy: 37.5 ng/mL (ref 30–100)

## 2020-09-03 LAB — LACTATE DEHYDROGENASE: LDH: 196 U/L — ABNORMAL HIGH (ref 98–192)

## 2020-09-03 LAB — T4, FREE: Free T4: 1.18 ng/dL — ABNORMAL HIGH (ref 0.61–1.12)

## 2020-09-03 MED ORDER — PREDNISONE 5 MG PO TABS: ORAL_TABLET | ORAL | 0 refills | Status: DC

## 2020-09-03 NOTE — Addendum Note (Signed)
Addended by: Kathrynn Ducking on: 09/03/2020 04:59 PM   Modules accepted: Orders

## 2020-09-03 NOTE — Telephone Encounter (Signed)
I called the patient.  The patient had a catheterization of the heart last week, but several days later started having trouble with feeling as if his tongue and lips were swelling, is not clear that this is actually the case.  At any rate, he is having some increased problems with speech and with swallowing.  The problems seem to come and go, he has some fatigue associated with the speech.  The symptoms are unassociated with any other focal findings such as facial asymmetry or numbness or weakness of the face, arms, or legs.  The symptoms appear to be most consistent with myasthenia.  I will give him a short course of prednisone and he will increase his Mestinon to 1 full tablet before he eats.  Over the last day or so, his symptoms have improved.  Talking with the patient, I do not hear any hypophonia or dysarthria.  I will see him in the office on 2 September.

## 2020-09-03 NOTE — Telephone Encounter (Signed)
Contacted pt wife back, per DPR, LVM asking for her to return my call and advised for pt to go to ED for this reaction.

## 2020-09-03 NOTE — Progress Notes (Signed)
Marland Kitchen   HEMATOLOGY/ONCOLOGY CONSULTATION NOTE  Date of Service: 09/03/2020  Patient Care Team: Cyndi Bender, Hershal Coria as PCP - General (Physician Assistant)  CHIEF COMPLAINTS/PURPOSE OF CONSULTATION:  Bone lesion on spine.  HISTORY OF PRESENTING ILLNESS:   Mario Burns is a wonderful 75 y.o. male who has been referred to [place] by Dr Oswaldo Milian for evaluation and management of incidentally noted sclerotic spinal lesion noted on CT coronary angiogram and cardiac catheterization.  Patient has a history of prostate cancer status post prostatectomy, coronary disease, hiatal hernia, hypertension, dyslipidemia who recently had a CT coronary angiogram to evaluate his chest tightness.  This showed severe coronary disease and he subsequently had a cardiac catheterization on 08/26/2020 which showed occlusive disease in his mid and distal LAD with normal ejection fraction.  Recommended medical therapy.  Patient also has a history of myasthenia gravis which he follows with Dr. Jannifer Franklin from neurology for management.  Patient notes that since his cardiac catheterization he has noted numbness and paresthesias in his lips and tongue.  He was recommended to call Dr. Jannifer Franklin immediately to evaluate if this is worsening myasthenia or if he is having strokelike symptoms.  Patient did not call his neurologist or cardiologist to evaluate this previously.  No specific focal new back pains. No new urinary symptoms.  No change in bowel habits.  No other acute new focal symptoms.   MEDICAL HISTORY:  Past Medical History:  Diagnosis Date   Arthritis    Bladder neck contracture    Borderline hyperlipidemia    Claustrophobia    Complication of anesthesia    CLAUSTROPHOBIC   GERD (gastroesophageal reflux disease)    H/O hiatal hernia    History of prostate cancer    S/P PROSTATECTOMY   History of thyroid nodule    LARGE GOITER   Hypertension    Hypothyroidism, postsurgical    MG (myasthenia gravis)  ([place])    DX 2013 --  NEUROLOGIST--  DR WILLIS/  TREATED W/ MEDICATION UNTIL SYMPTOMS RESOLVED -- NO REMISSION--  SEES DR WILLIS PRN   OSA (obstructive sleep apnea)    NON-COMPLIANT CPAP--  CLAUSTROPHOBIC   Shortness of breath    ALWAYS--HAD FOR YEARS - STATES HE CAN'T DO HARDLY ANYTHING WITHOUT GIVING OUT   Type 2 diabetes mellitus (HCC)    Weak urinary stream     SURGICAL HISTORY: Past Surgical History:  Procedure Laterality Date   CYSTOSCOPY N/A 07/30/2013   Procedure: CYSTOSCOPY WITH Anastasio Champion ;  Surgeon: Bernestine Amass, MD;  Location: [facility];  Service: Urology;  Laterality: N/A;   CYSTOSCOPY WITH URETHRAL DILATATION N/A 08/02/2012   Procedure: CYSTOSCOPY WITH URETHRAL DILATATION, AN REMOVAL STAPLE FROM BLADDER NECK;  Surgeon: Bernestine Amass, MD;  Location: [facility];  Service: Urology;  Laterality: N/A;   HEMORRHOID SURGERY  1990's   INCISIONAL HERNIA REPAIR N/A 04/23/2013   Procedure: LAPAROSCOPIC INCISIONAL HERNIA;  Surgeon: [name], MD;  Location: [facility];  Service: General;  Laterality: N/A;   INSERTION OF MESH N/A 04/23/2013   Procedure: INSERTION OF MESH;  Surgeon: [name], MD;  Location: [facility];  Service: General;  Laterality: N/A;   LEFT HEART CATH AND CORONARY ANGIOGRAPHY N/A 08/26/2020   Procedure: LEFT HEART CATH AND CORONARY ANGIOGRAPHY;  Surgeon: Jettie Booze, MD;  Location: [facility];  Service: Cardiovascular;  Laterality: N/A;   ROBOT ASSISTED LAPAROSCOPIC RADICAL PROSTATECTOMY  12/15/2011   Procedure: ROBOTIC ASSISTED LAPAROSCOPIC  RADICAL PROSTATECTOMY;  Surgeon: Bernestine Amass, MD;  Location: [facility];  Service: Urology;  Laterality: N/A;   TOTAL THYROIDECTOMY  05-24-2003    SOCIAL HISTORY: Social History   Socioeconomic History   Marital status: Married    Spouse name: Doris   Number of children: 1   Years of education: 10th   Highest education level: Not on file  Occupational History    Occupation: semi-retired  Tobacco Use   Smoking status: Never   Smokeless tobacco: Never  Vaping Use   Vaping Use: Never used  Substance and Sexual Activity   Alcohol use: No   Drug use: No   Sexual activity: Not on file  Other Topics Concern   Not on file  Social History Narrative   Lives at home with his wife.   Right-handed.   2-3 cups caffeine per day.   Social Determinants of Health   Financial Resource Strain: Not on file  Food Insecurity: Not on file  Transportation Needs: Not on file  Physical Activity: Not on file  Stress: Not on file  Social Connections: Not on file  Intimate Partner Violence: Not on file    FAMILY HISTORY: Family History  Problem Relation Age of Onset   Heart disease Mother    Glaucoma Mother    Hypertension Mother    Colon cancer Father    Diabetes Sister    Diabetes Brother    Diabetes Brother    Cancer Brother     ALLERGIES:  is allergic to sulfa antibiotics.  MEDICATIONS:  Current Outpatient Medications  Medication Sig Dispense Refill   acetaminophen (TYLENOL) 500 MG tablet Take 1,000 mg by mouth in the morning.     aspirin EC 81 MG tablet Take 1 tablet (81 mg total) by mouth daily. Swallow whole. 90 tablet 3   azaTHIOprine (IMURAN) 50 MG tablet Take 1 tablet by mouth twice daily 180 tablet 0   glimepiride (AMARYL) 4 MG tablet Take 4 mg by mouth 2 (two) times daily.     isosorbide mononitrate (IMDUR) 30 MG 24 hr tablet Take 1 tablet (30 mg total) by mouth daily. 90 tablet 3   levothyroxine (SYNTHROID) 150 MCG tablet Take 150 mcg by mouth daily before breakfast.     losartan (COZAAR) 100 MG tablet Take 100 mg by mouth daily.     nitroGLYCERIN (NITROSTAT) 0.4 MG SL tablet Place 1 tablet (0.4 mg total) under the tongue every 5 (five) minutes as needed. 25 tablet 3   omeprazole (PRILOSEC) 20 MG capsule Take 20 mg by mouth every evening.      ONE TOUCH ULTRA TEST test strip      ONETOUCH DELICA LANCETS 44W MISC      pyridostigmine  (MESTINON) 60 MG tablet TAKE 1/2 (ONE-HALF) TABLET BY MOUTH EVERY 4 HOURS WHILE AWAKE (Patient taking differently: Take 30 mg by mouth in the morning and at bedtime.) 180 tablet 0   TOUJEO MAX SOLOSTAR 300 UNIT/ML SOPN Inject 18 Units into the skin at bedtime.     No current facility-administered medications for this visit.    REVIEW OF SYSTEMS:    10 Point review of Systems was done is negative except as noted above.  PHYSICAL EXAMINATION: ECOG PERFORMANCE STATUS: 1 - Symptomatic but completely ambulatory  . Vitals:   09/03/20 1304  BP: 134/73  Pulse: 73  Resp: 20  Temp: 99 F (37.2 C)  SpO2: 100%   Filed Weights   09/03/20 1304  Weight: 176 lb  6.4 oz (80 kg)   .Body mass index is 25.31 kg/m.  GENERAL:alert, in no acute distress and comfortable SKIN: no acute rashes, no significant lesions EYES: conjunctiva are pink and non-injected, sclera anicteric OROPHARYNX: MMM, no exudates, no oropharyngeal erythema or ulceration NECK: supple, no JVD LYMPH:  no palpable lymphadenopathy in the cervical, axillary or inguinal regions LUNGS: clear to auscultation b/l with normal respiratory effort HEART: regular rate & rhythm ABDOMEN:  normoactive bowel sounds , non tender, not distended. Extremity: no pedal edema PSYCH: alert & oriented x 3 with fluent speech NEURO: no focal motor/sensory deficits  LABORATORY DATA:  I have reviewed the data as listed  . CBC Latest Ref Rng & Units 08/21/2020 05/26/2020 11/26/2019  WBC 3.4 - 10.8 x10E3/uL 5.5 5.3 5.3  Hemoglobin 13.0 - 17.7 g/dL 13.8 13.7 14.2  Hematocrit 37.5 - 51.0 % 41.9 40.6 41.9  Platelets 150 - 450 x10E3/uL 223 222 241    . CMP Latest Ref Rng & Units 08/21/2020 08/11/2020 05/26/2020  Glucose 65 - 99 mg/dL 200(H) 209(H) 127(H)  BUN 8 - 27 mg/dL 15 18 13   Creatinine 0.76 - 1.27 mg/dL 0.75(L) 0.92 0.75(L)  Sodium 134 - 144 mmol/L 134 139 135  Potassium 3.5 - 5.2 mmol/L 4.6 4.7 4.4  Chloride 96 - 106 mmol/L 100 102 100  CO2  20 - 29 mmol/L 22 17(L) 23  Calcium 8.6 - 10.2 mg/dL 9.6 9.8 8.9  Total Protein 6.0 - 8.5 g/dL - - 6.2  Total Bilirubin 0.0 - 1.2 mg/dL - - 0.8  Alkaline Phos 44 - 121 IU/L - - 88  AST 0 - 40 IU/L - - 19  ALT 0 - 44 IU/L - - 20     RADIOGRAPHIC STUDIES: I have personally reviewed the radiological images as listed and agreed with the findings in the report. CARDIAC CATHETERIZATION  Result Date: 08/26/2020   Mid Cx lesion is 60% stenosed.   Prox Cx to Mid Cx lesion is 50% stenosed.   Mid LAD lesion is 100% stenosed.   Dist LAD lesion is 100% stenosed.   The left ventricular systolic function is normal.   LV end diastolic pressure is normal.   The left ventricular ejection fraction is 55-65% by visual estimate.   There is no aortic valve stenosis. Chronic total occlusion of the mid LAD.  There may be a distal LAD occlusion as well.  Collaterals fill the mid to distal LAD. Medical therapy.  Sclerotic spine lesion currently under investigation.    CT CORONARY MORPH W/CTA COR W/SCORE W/CA W/CM &/OR WO/CM  Addendum Date: 08/15/2020   ADDENDUM REPORT: 08/15/2020 14:32 HISTORY: Chest pain, nonspecific EXAM: Cardiac/Coronary  CT TECHNIQUE: The patient was scanned on a [employer]. PROTOCOL: A 120 kV prospective scan was triggered in the descending thoracic aorta at 111 HU's. Axial non-contrast 3 mm slices were carried out through the heart. The data set was analyzed on a dedicated work station and scored using the Agatston method. Gantry rotation speed was 250 msecs and collimation was .6 mm. Beta blockade and 0.8 mg of sl NTG was given. The 3D data set was reconstructed in 5% intervals of the 35-75 % of the R-R cycle. Systolic and diastolic phases were analyzed on a dedicated work station using MPR, MIP and VRT modes. The patient received 167m OMNIPAQUE IOHEXOL 350 MG/ML SOLN contrast. FINDINGS: Image quality: Good. Noise artifact is: Mild misregistration. Coronary calcium score is 0. Coronary  arteries: Normal coronary origins.  Right  dominance. Right Coronary Artery: Minimal atherosclerotic plaque in the mid RCA, <25% stenosis. Left Main Coronary Artery: No detectable plaque or stenosis. Left Anterior Descending Coronary Artery: Probable total occlusion of the mid LAD just beyond the branch point of the first septal perforator and first diagonal artery, 100% stenosis with atherosclerotic plaque. Possible channel of flow with perfusion of distal LAD vs collateral flow from left to left collaterals. Left Circumflex Artery: Minimal atherosclerotic plaque in the proximal L circumflex, <25% stenosis. Aorta: Measurements made in double oblique technique: Sinus of Valsalva (sinus to sinus): R-L: 40 mm L-Non: 40 mm R-Non: 38 mm ST junction: 32 mm Mid ascending aorta (at PA bifurcation): 38 mm Mid descending aorta (at level of pulmonary veins): 27 mm Distal descending aorta (just proximal to aortic hiatus): 25 mm Aortic Valve: No calcifications. Other findings: Normal pulmonary vein drainage into the left atrium. Normal left atrial appendage without a thrombus. Normal size of the pulmonary artery. IMPRESSION: 1. Severe CAD with probable total occlusion of the mid LAD, CADRADS = 5. 2. Coronary calcium score of 0. 3. Normal coronary origin with right dominance. 4.  Mild ascending aorta dilation, 40 mm at sinus of Valsalva RECOMMENDATIONS Consider cardiac catheterization for mid LAD occlusion. Electronically Signed   By: Cherlynn Kaiser M.D.   On: 08/15/2020 14:32   Result Date: 08/15/2020 EXAM: OVER-READ INTERPRETATION  CT CHEST The following report is an over-read performed by radiologist Dr. Aletta Edouard of [facility], [place] on 08/15/2020. This over-read does not include interpretation of cardiac or coronary anatomy or pathology. The coronary CTA interpretation by the cardiologist is attached. COMPARISON:  CT of the chest on 03/05/2011 FINDINGS: Vascular: No significant noncardiac vascular findings.  Mediastinum/Nodes: Visualized mediastinum and hilar regions demonstrate no lymphadenopathy or masses. Small hiatal hernia present. Lungs/Pleura: Visualized lungs show no evidence of pulmonary edema, consolidation, pneumothorax, nodule or pleural fluid. Upper Abdomen: No acute abnormality. Musculoskeletal: Rounded area of sclerosis measuring up to 18 mm is identified in the T8 vertebral body just to the right of midline and was not definitely present on the 2013 chest CT. This is not associated with fracture. No other sclerotic or lytic bone lesions identified in visualized bony structures. IMPRESSION: 1. Rounded area of sclerosis measuring roughly 1.8 cm in greatest diameter within the T8 vertebral body which was not present on a 2013 chest CT. The patient does have a history of prior prostate carcinoma and this may represent a focus of metastatic disease. It is unclear whether this is active metastatic disease and correlation suggested initially with PSA level. Referral to the patient's urologist is recommended. 2. Small hiatal hernia. Electronically Signed: By: Aletta Edouard M.D. On: 08/15/2020 12:30   ECHOCARDIOGRAM COMPLETE  Result Date: 08/14/2020    ECHOCARDIOGRAM REPORT   Patient Name:   Mario Burns Date of Exam: 08/14/2020 Medical Rec #:  [id number]       Height:       69.0 in Accession #:    [id number]      Weight:       176.6 lb Date of Birth:  Mar 29, 1945        BSA:          1.960 m Patient Age:    74 years        BP:           162/98 mmHg Patient Gender: M               HR:  61 bpm. Exam Location:  [place] Procedure: 2D Echo, Cardiac Doppler and Color Doppler Indications:    R06.00 Dyspnea  History:        Patient has no prior history of Echocardiogram examinations.                 Risk Factors:Hypertension and Diabetes. Obstructive sleep apnea.                 Shortness of breath.  Sonographer:    Wilford Sports Rodgers-Jones RDCS Referring Phys: [id number] [place]  1. Left ventricular ejection fraction, by estimation, is 50 to 55%. The left ventricle has low normal function. The left ventricle has no regional wall motion abnormalities. Left ventricular diastolic parameters are consistent with Grade I diastolic dysfunction (impaired relaxation).  2. Right ventricular systolic function is normal. The right ventricular size is normal.  3. The mitral valve is normal in structure. Trivial mitral valve regurgitation. No evidence of mitral stenosis.  4. The aortic valve is tricuspid. Aortic valve regurgitation is trivial. Mild aortic valve sclerosis is present, with no evidence of aortic valve stenosis.  5. Aortic dilatation noted. There is mild dilatation of the ascending aorta, measuring 41 mm. FINDINGS  Left Ventricle: Left ventricular ejection fraction, by estimation, is 50 to 55%. The left ventricle has low normal function. The left ventricle has no regional wall motion abnormalities. The left ventricular internal cavity size was normal in size. There is no left ventricular hypertrophy. Left ventricular diastolic parameters are consistent with Grade I diastolic dysfunction (impaired relaxation). Right Ventricle: The right ventricular size is normal. Right ventricular systolic function is normal. Left Atrium: Left atrial size was normal in size. Right Atrium: Right atrial size was normal in size. Pericardium: There is no evidence of pericardial effusion. Mitral Valve: The mitral valve is normal in structure. Trivial mitral valve regurgitation. No evidence of mitral valve stenosis. Tricuspid Valve: The tricuspid valve is normal in structure. Tricuspid valve regurgitation is mild . No evidence of tricuspid stenosis. Aortic Valve: The aortic valve is tricuspid. Aortic valve regurgitation is trivial. Mild aortic valve sclerosis is present, with no evidence of aortic valve stenosis. Pulmonic Valve: The pulmonic valve was normal in structure. Pulmonic valve regurgitation  is trivial. No evidence of pulmonic stenosis. Aorta: Aortic dilatation noted. There is mild dilatation of the ascending aorta, measuring 41 mm. Venous: The inferior vena cava was not well visualized. IAS/Shunts: The interatrial septum appears to be lipomatous. No atrial level shunt detected by color flow Doppler.  LEFT VENTRICLE PLAX 2D LVIDd:         4.10 cm  Diastology LVIDs:         2.90 cm  LV e' medial:    6.42 cm/s LV PW:         0.90 cm  LV E/e' medial:  10.5 LV IVS:        0.90 cm  LV e' lateral:   6.31 cm/s LVOT diam:     1.90 cm  LV E/e' lateral: 10.7 LV SV:         66 LV SV Index:   33 LVOT Area:     2.84 cm  RIGHT VENTRICLE RV Basal diam:  4.60 cm RV S prime:     13.40 cm/s TAPSE (M-mode): 2.1 cm LEFT ATRIUM             Index       RIGHT ATRIUM  Index LA diam:        4.00 cm 2.04 cm/m  RA Area:     14.20 cm LA Vol (A2C):   53.8 ml 27.45 ml/m RA Volume:   39.40 ml  20.11 ml/m LA Vol (A4C):   41.9 ml 21.38 ml/m LA Biplane Vol: 48.4 ml 24.70 ml/m  AORTIC VALVE LVOT Vmax:   111.50 cm/s LVOT Vmean:  71.700 cm/s LVOT VTI:    0.232 m  AORTA Ao Root diam: 3.80 cm Ao Asc diam:  4.30 cm MITRAL VALVE               TRICUSPID VALVE MV Area (PHT): 2.91 cm    TR Peak grad:   24.6 mmHg MV Decel Time: 261 msec    TR Vmax:        248.00 cm/s MV E velocity: 67.70 cm/s MV A velocity: 93.80 cm/s  SHUNTS MV E/A ratio:  0.72        Systemic VTI:  0.23 m                            Systemic Diam: 1.90 cm Kirk Ruths MD Electronically signed by Kirk Ruths MD Signature Date/Time: 08/14/2020/12:42:43 PM    Final     ASSESSMENT & PLAN:   75 year old male with  #1 incidentally noted T8 1.8 cm sclerotic lesion. This was incidentally noted on CT coronary angiogram. Patient does not have any specific focal pain associated with this.  #2 history of prostate cancer.  Not currently on any treatment.  #3 history of coronary artery disease.  #4 history of myasthenia gravis on Imuran and pyridostigmine  follows with Dr. Jannifer Franklin.  Plan Discussed his labs and available imaging study in detail with the patient. Ordered PET CT scan to evaluate his T8 lesion and presents for further bone metastases/bone lesions in other parts of the body and also to evaluate for a primary site of her tumor. -Myeloma labs ordered. -PSA levels -Other labs ordered -Patient notes some lip and tongue heaviness since his cardiac catheterization.  He was recommended to call his neurologist immediately to determine if this is from his myasthenia gravis or if there is concern for strokelike symptoms.  If he cannot be evaluated by his neurologist urgently he was recommended to go to the emergency room for further evaluation. .  Follow-up Labs today PET/CT in 5 days RTC with Dr Irene Limbo in 10 days -call Neurology Dr Jannifer Franklin today to report your new neurologic symptoms- if cannot get appointment soon will need to go to ED   Orders Placed This Encounter  Procedures   NM PET Image Initial (PI) Whole Body    Standing Status:   Future    Standing Expiration Date:   09/03/2021    Order Specific Question:   If indicated for the ordered procedure, I authorize the administration of a radiopharmaceutical per Radiology protocol    Answer:   Yes    Order Specific Question:   Preferred imaging location?    Answer:   [place]   CBC with Differential (Cancer Center Only)    Standing Status:   Future    Number of Occurrences:   1    Standing Expiration Date:   09/03/2021   CMP ([place] only)    Standing Status:   Future    Number of Occurrences:   1    Standing Expiration Date:   09/03/2021   Lactate dehydrogenase  Standing Status:   Future    Number of Occurrences:   1    Standing Expiration Date:   09/03/2021   Prostate-Specific AG, Serum    Standing Status:   Future    Number of Occurrences:   1    Standing Expiration Date:   09/03/2021   Multiple Myeloma Panel (SPEP&IFE w/QIG)    Standing Status:   Future    Number  of Occurrences:   1    Standing Expiration Date:   09/03/2021   Kappa/lambda light chains    Standing Status:   Future    Number of Occurrences:   1    Standing Expiration Date:   09/03/2021   Beta 2 microglobulin, serum    Standing Status:   Future    Number of Occurrences:   1    Standing Expiration Date:   09/03/2021   Vitamin D 25 hydroxy    Standing Status:   Future    Number of Occurrences:   1    Standing Expiration Date:   09/03/2021   TSH    Standing Status:   Future    Number of Occurrences:   1    Standing Expiration Date:   09/03/2021   T4, free    Standing Status:   Future    Number of Occurrences:   1    Standing Expiration Date:   09/03/2021     All of the patients questions were answered with apparent satisfaction. The patient knows to call the clinic with any problems, questions or concerns.  I spent 40 minutes counseling the patient face to face. The total time spent in the appointment was 60 minutes and more than 50% was on counseling and direct patient cares.    Sullivan Lone MD [place] AAHIVMS [facility] Hematology/Oncology Physician [facility]  (Office):       [phone] (Work cell):  [phone] (Fax):           [phone]  09/03/2020 1:17 PM

## 2020-09-03 NOTE — Telephone Encounter (Signed)
Pt's wife called stating that the pt is having swelling of the lips and tongue and is having trouble swallowing. They were advised to call here to see if it is an allergic reaction to a medication he is prescribed here. Please advise.

## 2020-09-03 NOTE — Patient Instructions (Signed)
Thank you for choosing [facility] to provide your care.   Should you have questions after your visit to the [facility] ([facility]), please contact this office at [phone] between 8:30 AM and 4:30 PM.  Voice mails left after 4:00 PM may not be returned until the following business day.  Calls received after 4:30 PM will be answered by an off-site Nurse Triage Line.    Prescription Refills:  Please have your pharmacy contact us directly for most prescription requests.  Contact the office directly for refills of narcotics (pain medications). Allow 48-72 hours for refills.  Appointments: Please contact the [facility] scheduling department [phone] for questions regarding [facility] appointment scheduling.  Contact the schedulers with any scheduling changes so that your appointment can be rescheduled in a timely manner.   Central Scheduling for [place] [phone] - Call to schedule procedures such as PET scans, CT scans, MRI, Ultrasound, etc.  To afford each patient quality time with our providers, please arrive 30 minutes before your scheduled appointment time.  If you arrive late for your appointment, you may be asked to reschedule.  We strive to give you quality time with our providers, and arriving late affects you and other patients whose appointments are after yours. If you are a no show for multiple scheduled visits, you may be dismissed from the clinic at the providers discretion.     Resources: [facility] Social Workers [phone] for additional information on assistance programs or assistance connecting with community support programs   [place] DSS  [phone]: Information regarding food stamps, Medicaid, and utility assistance [place] [employer] [place] [phone]   [employer]'s shared-ride transportation service for eligible riders who have a disability that prevents them from riding the fixed route bus.   Medicare Rights Center 800-333-4114  Helps people with Medicare understand their rights and benefits, navigate the Medicare system, and secure the quality healthcare they deserve [employer] 800-227-2345 Assists patients locate various types of support and financial assistance Cancer Care: 1-800-813-HOPE (4673) Provides financial assistance, online support groups, medication/co-pay assistance.   Transportation Assistance for appointments at [facility]: Transportation Coordinator [phone]  Again, thank you for choosing [facility] for your care.       

## 2020-09-03 NOTE — Telephone Encounter (Signed)
Pt's wife called back and I was able to speak with the pt. Pt reports on 08/26/20 he had a heart cath and 2 days after ( 8/25) he started having slurring of speech, swollen tongue/lips, and trouble swallowing. Pt sts he did not go to the ED at that time and reports sx have increasingly worsened. Pt reports he feels like his tongue is more swollen and when he tries to eat or drink the items will fall out of his mouth.  I asked if he or his wife has noticed a droopiness on the left or right side of his face and he denied this being an issue for him.  Pt was advised to go to the ED since he felt like his sx were worsening and the increased trouble with speech, swallowing, and swelling.  I advised I would send over message to Dr. Jannifer Franklin as well. Pt was agreeable to this plan at the time of the call, but would like to talk with Dr. Jannifer Franklin if possible.   Pt has f/u on the books for 09/05/20 and will keep as scheduled.

## 2020-09-04 LAB — KAPPA/LAMBDA LIGHT CHAINS
Kappa free light chain: 26.9 mg/L — ABNORMAL HIGH (ref 3.3–19.4)
Kappa, lambda light chain ratio: 1.25 (ref 0.26–1.65)
Lambda free light chains: 21.5 mg/L (ref 5.7–26.3)

## 2020-09-04 LAB — BETA 2 MICROGLOBULIN, SERUM: Beta-2 Microglobulin: 1.7 mg/L (ref 0.6–2.4)

## 2020-09-04 LAB — PROSTATE-SPECIFIC AG, SERUM (LABCORP): Prostate Specific Ag, Serum: <0.1 ng/mL (ref 0.0–4.0)

## 2020-09-05 ENCOUNTER — Encounter: Payer: Self-pay | Admitting: Neurology

## 2020-09-05 ENCOUNTER — Ambulatory Visit: Payer: PPO | Admitting: Neurology

## 2020-09-05 ENCOUNTER — Ambulatory Visit: Payer: PPO

## 2020-09-05 VITALS — BP 128/72 | HR 66 | Ht 70.0 in | Wt 175.0 lb

## 2020-09-05 DIAGNOSIS — G7000 Myasthenia gravis without (acute) exacerbation: Secondary | ICD-10-CM | POA: Diagnosis not present

## 2020-09-05 MED ORDER — AZATHIOPRINE 50 MG PO TABS: ORAL_TABLET | ORAL | 1 refills | Status: DC

## 2020-09-05 NOTE — Progress Notes (Signed)
Reason for visit: Myasthenia gravis  Mario Burns is an 75 y.o. male  History of present illness:  Mario Burns is a 75 year old right-handed white male with a history of myasthenia gravis primarily with pharyngeal features.  He has had exacerbation of his underlying illness within the last several days.  He has noted some problems with moving his tongue, he has difficulty with swallowing and choking at times.  He denies any significant issues with double vision or ptosis and he has not had a lot of weakness of the arms or legs.  He is on azathioprine 50 mg twice daily.  In the past, higher doses have resulted in headache.  He was taken off of CellCept previously because he felt it was upsetting his stomach.  The patient is on Mestinon, he was recently instructed to increase the dose to a full tablet before meals.  He was placed on a prednisone Dosepak over the next 6 days, but this has elevated his blood sugars.  He is some better over the last couple days with his speech and swallowing but it is still an issue for him.  He returns for an evaluation.  Past Medical History:  Diagnosis Date   Arthritis    Bladder neck contracture    Borderline hyperlipidemia    Claustrophobia    Complication of anesthesia    CLAUSTROPHOBIC   GERD (gastroesophageal reflux disease)    H/O hiatal hernia    History of prostate cancer    S/P PROSTATECTOMY   History of thyroid nodule    LARGE GOITER   Hypertension    Hypothyroidism, postsurgical    MG (myasthenia gravis) ([place])    DX 2013 --  NEUROLOGIST--  DR Mario Burns/  TREATED W/ MEDICATION UNTIL SYMPTOMS RESOLVED -- NO REMISSION--  SEES DR Mario Burns PRN   OSA (obstructive sleep apnea)    NON-COMPLIANT CPAP--  CLAUSTROPHOBIC   Shortness of breath    ALWAYS--HAD FOR YEARS - STATES HE CAN'T DO HARDLY ANYTHING WITHOUT GIVING OUT   Type 2 diabetes mellitus (HCC)    Weak urinary stream     Past Surgical History:  Procedure Laterality Date   CYSTOSCOPY N/A  07/30/2013   Procedure: CYSTOSCOPY WITH Mario Burns ;  Surgeon: Mario Amass, MD;  Location: [facility];  Service: Urology;  Laterality: N/A;   CYSTOSCOPY WITH URETHRAL DILATATION N/A 08/02/2012   Procedure: CYSTOSCOPY WITH URETHRAL DILATATION, AN REMOVAL STAPLE FROM BLADDER NECK;  Surgeon: Mario Amass, MD;  Location: [facility];  Service: Urology;  Laterality: N/A;   HEMORRHOID SURGERY  1990's   INCISIONAL HERNIA REPAIR N/A 04/23/2013   Procedure: LAPAROSCOPIC INCISIONAL HERNIA;  Surgeon: Mario Hector, MD;  Location: [facility];  Service: General;  Laterality: N/A;   INSERTION OF MESH N/A 04/23/2013   Procedure: INSERTION OF MESH;  Surgeon: Mario Hector, MD;  Location: [facility];  Service: General;  Laterality: N/A;   LEFT HEART CATH AND CORONARY ANGIOGRAPHY N/A 08/26/2020   Procedure: LEFT HEART CATH AND CORONARY ANGIOGRAPHY;  Surgeon: Mario Booze, MD;  Location: [place] [facility];  Service: Cardiovascular;  Laterality: N/A;   ROBOT ASSISTED LAPAROSCOPIC RADICAL PROSTATECTOMY  12/15/2011   Procedure: ROBOTIC ASSISTED LAPAROSCOPIC RADICAL PROSTATECTOMY;  Surgeon: Mario Amass, MD;  Location: [facility];  Service: Urology;  Laterality: N/A;   TOTAL THYROIDECTOMY  05-24-2003    Family History  Problem Relation Age of Onset   Heart disease Mother  Glaucoma Mother    Hypertension Mother    Colon cancer Father    Diabetes Sister    Diabetes Brother    Diabetes Brother    Cancer Brother     Social history:  reports that he has never smoked. He has never used smokeless tobacco. He reports that he does not drink alcohol and does not use drugs.    Allergies  Allergen Reactions   Sulfa Antibiotics Other (See Comments)    unknown    Medications:  Prior to Admission medications   Medication Sig Start Date End Date Taking? Authorizing Provider  acetaminophen (TYLENOL) 500 MG tablet Take 1,000 mg by mouth in the morning.   Yes [provider]  aspirin EC 81 MG tablet Take 1 tablet (81 mg total) by mouth daily. Swallow whole. 08/21/20  Yes Mario Heinz, MD  azaTHIOprine (IMURAN) 50 MG tablet Take 1 tablet by mouth twice daily 06/30/20  Yes Mario Ducking, MD  glimepiride (AMARYL) 4 MG tablet Take 4 mg by mouth 2 (two) times daily.   Yes [provider]  levothyroxine (SYNTHROID) 150 MCG tablet Take 150 mcg by mouth daily before breakfast. 03/21/19  Yes [provider]  losartan (COZAAR) 100 MG tablet Take 100 mg by mouth daily. 03/21/19  Yes [provider]  nitroGLYCERIN (NITROSTAT) 0.4 MG SL tablet Place 1 tablet (0.4 mg total) under the tongue every 5 (five) minutes as needed. 08/21/20 11/19/20 Yes Mario Heinz, MD  omeprazole (PRILOSEC) 20 MG capsule Take 20 mg by mouth every evening.    Yes [provider]  ONE TOUCH ULTRA TEST test strip  03/23/16  Yes [provider]  Jonetta Speak LANCETS [id number] [place]  03/23/16  Yes [provider]  predniSONE (DELTASONE) 5 MG tablet Begin taking 6 tablets daily, taper by one tablet daily until off the medication. 09/03/20  Yes Mario Ducking, MD  pyridostigmine (MESTINON) 60 MG tablet TAKE 1/2 (ONE-HALF) TABLET BY MOUTH EVERY 4 HOURS WHILE AWAKE Patient taking differently: Take 30 mg by mouth 3 (three) times daily. 06/30/20  Yes Mario Ducking, MD  TOUJEO MAX SOLOSTAR 300 UNIT/ML SOPN Inject 18 Units into the skin at bedtime. 04/22/18  Yes [provider]    ROS:  Out of a complete 14 system review of symptoms, the patient complains only of the following symptoms, and all other reviewed systems are negative.  Speech problems Swallowing, coughing issues  Blood pressure 128/72, pulse 66, height '5\' 10"'$  (1.778 m), weight 175 lb (79.4 kg).  Physical Exam  General: The patient is alert and cooperative at the time of the examination.  Skin: No significant peripheral edema is  noted.   Neurologic Exam  Mental status: The patient is alert and oriented x 3 at the time of the examination. The patient has apparent normal recent and remote memory, with an apparently normal attention span and concentration ability.   Cranial nerves: Facial symmetry is present. Speech is normal, no aphasia or dysarthria is noted. Extraocular movements are full. Visual fields are full.  Facial strength and strength with jaw opening and closure is full and normal.  With superior gaze for 1 minute, no ptosis or divergence of gaze and no subjective double vision was noted.  Motor: The patient has good strength in all 4 extremities.  With arms outstretched for 1 minute, no fatigable weakness of the deltoid muscles was seen, but the patient does have giveaway weakness on the left secondary  to pain.  Sensory examination: Soft touch sensation is symmetric on the face, arms, and legs.  Coordination: The patient has good finger-nose-finger and heel-to-shin bilaterally.  Gait and station: The patient has a normal gait. Tandem gait is slightly unsteady. Romberg is negative. No drift is seen.  Reflexes: Deep tendon reflexes are symmetric.   Assessment/Plan:  1.  Myasthenia gravis with recent exacerbation  The patient has pharyngeal onset myasthenia, he has had some worsening of pharyngeal symptoms over the last several days.  We will try to push up the dose of the azathioprine to 50 mg in the morning and 100 mg in the evening.  He will watch out for headaches.  He will continue the elevated dose of Mestinon as long as he is not having a lot of diarrhea.  He will follow-up here in 4 months, in the future he can be followed through Dr. Krista Blue.  He has had recent blood work done to include a CBC and comprehensive metabolic profile, these were unremarkable.  Jill Alexanders MD 09/05/2020 11:12 AM  [facility] [address] [place], [place] [address]  Phone  [phone] Fax [phone]

## 2020-09-09 LAB — MULTIPLE MYELOMA PANEL, SERUM
Albumin SerPl Elph-Mcnc: 3.8 g/dL (ref 2.9–4.4)
Albumin/Glob SerPl: 1.3 (ref 0.7–1.7)
Alpha 1: 0.2 g/dL (ref 0.0–0.4)
Alpha2 Glob SerPl Elph-Mcnc: 0.9 g/dL (ref 0.4–1.0)
B-Globulin SerPl Elph-Mcnc: 1 g/dL (ref 0.7–1.3)
Gamma Glob SerPl Elph-Mcnc: 1.1 g/dL (ref 0.4–1.8)
Globulin, Total: 3.1 g/dL (ref 2.2–3.9)
IgA: 200 mg/dL (ref 61–437)
IgG (Immunoglobin G), Serum: 1078 mg/dL (ref 603–1613)
IgM (Immunoglobulin M), Srm: 64 mg/dL (ref 15–143)
Total Protein ELP: 6.9 g/dL (ref 6.0–8.5)

## 2020-09-14 NOTE — Progress Notes (Signed)
Cardiology Office Note:    Date:  09/18/2020   ID:  Mario Burns, DOB 25-Nov-1945, MRN [id number]  PCP:  Cyndi Bender, PA-C  Cardiologist:  None  Electrophysiologist:  None   Referring MD: Cyndi Bender, PA-C   Chief Complaint  Patient presents with   Coronary Artery Disease       History of Present Illness:    Mario Burns is a 75 y.o. male with a hx of hypertension, myasthenia gravis, hypothyroidism, T2DM who presents for follow-up.  He was referred by Cyndi Bender, PA for evaluation of shortness of breath, initially seen on 08/07/2020.  He reports that he has been having chest pain and shortness of breath.  Reports had 2-3 episodes of chest pain over the last 2 weeks.  Reports pain is in center of his chest, sometimes sharp but sometimes pressure.  Often wakes up with it at night.  He does not exercise.  Reports has had chest pain with exertion but also can occur at rest.  Does report he gets short of breath with minimal exertion.  Reports chest pain can last for 15 minutes or so, but has lasted up to 1 hour.  Reports lightheadedness intermittently, denies any syncope.  Does report intermittent lower extremity edema.  Reports rare palpitations, none recently.  Does not check BP at home.  No smoking history.  Family history includes mother had heart failure, died at 62.  His sister sees a cardiologist, he is unsure of the details.  Echocardiogram 08/14/2020 showed EF 50 to XX123456, grade 1 diastolic dysfunction, normal RV function, no significant valvular disease, mild dilatation of ascending aorta measuring 41 mm.  Coronary CTA on 08/15/2020 showed calcium score 0, probable total occlusion of mid LAD, mild ascending aortic dilatation measuring 40 mm.  Also noted to have aortic sclerosis measuring 18 mm in T8 vertebral body.  Cath on 08/26/2020 showed chronic total occlusion of mid LAD with collaterals filling mid to distal LAD, 60% LCx, 50% proximal to mid Lcx.  Since last clinic visit, he  reports he is having more issues with chest pain.  Reports occurring every day, describes left-sided/central chest pain that last for about 1 minute or so.  Reports pain is sharp.  Happens randomly but seems to occur mostly at rest.  Does report he gets short of breath with exertion.  Also has had some palpitations recently, lasted for several minutes earlier this week.  Reports he was having trouble talking or eating after his heart catheterization.  Thought to be worsening myasthenia, was started on prednisone by his neurologist with improvement in symptoms.   Past Medical History:  Diagnosis Date   Arthritis    Bladder neck contracture    Borderline hyperlipidemia    Claustrophobia    Complication of anesthesia    CLAUSTROPHOBIC   GERD (gastroesophageal reflux disease)    H/O hiatal hernia    History of prostate cancer    S/P PROSTATECTOMY   History of thyroid nodule    LARGE GOITER   Hypertension    Hypothyroidism, postsurgical    MG (myasthenia gravis) ([place])    DX 2013 --  NEUROLOGIST--  DR WILLIS/  TREATED W/ MEDICATION UNTIL SYMPTOMS RESOLVED -- NO REMISSION--  SEES DR WILLIS PRN   OSA (obstructive sleep apnea)    NON-COMPLIANT CPAP--  CLAUSTROPHOBIC   Shortness of breath    ALWAYS--HAD FOR YEARS - STATES HE CAN'T DO HARDLY ANYTHING WITHOUT GIVING OUT   Type 2 diabetes mellitus ([place])  Weak urinary stream     Past Surgical History:  Procedure Laterality Date   CYSTOSCOPY N/A 07/30/2013   Procedure: CYSTOSCOPY WITH Anastasio Champion ;  Surgeon: Bernestine Amass, MD;  Location: [facility];  Service: Urology;  Laterality: N/A;   CYSTOSCOPY WITH URETHRAL DILATATION N/A 08/02/2012   Procedure: CYSTOSCOPY WITH URETHRAL DILATATION, AN REMOVAL STAPLE FROM BLADDER NECK;  Surgeon: Bernestine Amass, MD;  Location: [facility];  Service: Urology;  Laterality: N/A;   HEMORRHOID SURGERY  1990's   INCISIONAL HERNIA REPAIR N/A 04/23/2013   Procedure: LAPAROSCOPIC  INCISIONAL HERNIA;  Surgeon: [name], MD;  Location[facility];  Service: General;  Laterality: N/A;   INSERTION OF MESH N/A 04/23/2013   Procedure: INSERTION OF MESH;  Surgeon: [name], MD;  Location: [facility];  Service: General;  Laterality: N/A;   LEFT HEART CATH AND CORONARY ANGIOGRAPHY N/A 08/26/2020   Procedure: LEFT HEART CATH AND CORONARY ANGIOGRAPHY;  Surgeon: Jettie Booze, MD;  Location: [place] [facility];  Service: Cardiovascular;  Laterality: N/A;   ROBOT ASSISTED LAPAROSCOPIC RADICAL PROSTATECTOMY  12/15/2011   Procedure: ROBOTIC ASSISTED LAPAROSCOPIC RADICAL PROSTATECTOMY;  Surgeon: Bernestine Amass, MD;  Location: [facility];  Service: Urology;  Laterality: N/A;   TOTAL THYROIDECTOMY  05-24-2003    Current Medications: Current Meds  Medication Sig   acetaminophen (TYLENOL) 500 MG tablet Take 1,000 mg by mouth in the morning.   aspirin EC 81 MG tablet Take 1 tablet (81 mg total) by mouth daily. Swallow whole.   azaTHIOprine (IMURAN) 50 MG tablet Take 1 tablet in the morning and 2 in the evening   glimepiride (AMARYL) 4 MG tablet Take 4 mg by mouth 2 (two) times daily.   isosorbide mononitrate (IMDUR) 30 MG 24 hr tablet Take 0.5 tablets (15 mg total) by mouth daily.   levothyroxine (SYNTHROID) 150 MCG tablet Take 150 mcg by mouth daily before breakfast.   losartan (COZAAR) 100 MG tablet Take 100 mg by mouth daily.   nitroGLYCERIN (NITROSTAT) 0.4 MG SL tablet Place 1 tablet (0.4 mg total) under the tongue every 5 (five) minutes as needed.   omeprazole (PRILOSEC) 20 MG capsule Take 20 mg by mouth every evening.    ONE TOUCH ULTRA TEST test strip    ONETOUCH DELICA LANCETS [id number] MISC    pyridostigmine (MESTINON) 60 MG tablet TAKE 1/2 (ONE-HALF) TABLET BY MOUTH EVERY 4 HOURS WHILE AWAKE (Patient taking differently: Take 30 mg by mouth 3 (three) times daily.)   TOUJEO MAX SOLOSTAR 300 UNIT/ML SOPN Inject 18 Units into the skin at bedtime.     Allergies:   Sulfa  antibiotics   Social History   Socioeconomic History   Marital status: Married    Spouse name: Doris   Number of children: 1   Years of education: 10th   Highest education level: Not on file  Occupational History   Occupation: semi-retired  Tobacco Use   Smoking status: Never   Smokeless tobacco: Never  Vaping Use   Vaping Use: Never used  Substance and Sexual Activity   Alcohol use: No   Drug use: No   Sexual activity: Not on file  Other Topics Concern   Not on file  Social History Narrative   Lives at home with his wife.   Right-handed.   2-3 cups caffeine per day.   Social Determinants of Health   Financial Resource Strain: Not on file  Food Insecurity: Not on  file  Transportation Needs: Not on file  Physical Activity: Not on file  Stress: Not on file  Social Connections: Not on file     Family History: The patient's family history includes Cancer in his brother; Colon cancer in his father; Diabetes in his brother, brother, and sister; Glaucoma in his mother; Heart disease in his mother; Hypertension in his mother.  ROS:   Please see the history of present illness.     All other systems reviewed and are negative.  EKGs/Labs/Other Studies Reviewed:    The following studies were reviewed today:   EKG:  EKG is ordered today.  The ekg ordered today demonstrates sinus rhythm rate 60, no ST abnormality  Recent Labs: 09/03/2020: ALT 32; BUN 19; Creatinine 0.86; Hemoglobin 13.9; Platelet Count 249; Potassium 4.1; Sodium 137; TSH 0.747  Recent Lipid Panel No results found for: CHOL, TRIG, HDL, CHOLHDL, VLDL, LDLCALC, LDLDIRECT  Physical Exam:    VS:  BP (!) 144/86 (BP Location: Left Arm, Patient Position: Sitting, Cuff Size: Normal)   Pulse 66   Ht '5\' 9"'$  (1.753 m)   Wt 174 lb 9.6 oz (79.2 kg)   SpO2 99%   BMI 25.78 kg/m     Wt Readings from Last 3 Encounters:  09/16/20 174 lb 9.6 oz (79.2 kg)  09/05/20 175 lb (79.4 kg)  09/03/20 176 lb 6.4 oz (80 kg)      GEN:  Well nourished, well developed in no acute distress HEENT: Normal NECK: No JVD; No carotid bruits CARDIAC: RRR, no murmurs, rubs, gallops RESPIRATORY:  Clear to auscultation without rales, wheezing or rhonchi  ABDOMEN: Soft, non-tender, non-distended MUSCULOSKELETAL:  Trace edema; No deformity  SKIN: Warm and dry NEUROLOGIC:  Alert and oriented x 3 PSYCHIATRIC:  Normal affect   ASSESSMENT:    1. Coronary artery disease of native artery of native heart with stable angina pectoris (HCC)   2. Palpitations   3. Hyperlipidemia, unspecified hyperlipidemia type   4. Essential hypertension      PLAN:    CAD: Reported atypical chest pain.  Echocardiogram 08/14/2020 showed EF 50 to XX123456, grade 1 diastolic dysfunction, normal RV function, no significant valvular disease, mild dilatation of ascending aorta measuring 41 mm.  Coronary CTA on 08/15/2020 showed calcium score 0, probable total occlusion of mid LAD.  Cath on 08/26/2020 showed chronic total occlusion of mid LAD with collaterals filling mid to distal LAD, 60% LCx, 50% proximal to mid Lcx. -Started Imdur 30 mg daily, but did not tolerate due to headache.  Will decrease dose to 50 mg daily -No beta-blocker given myasthenia gravis -As needed sublingual nitroglycerin -Aspirin 81 mg daily -Referral to lipid clinic for PCSK9 inhibitor as unable to tolerate statins.  Palpitations: Description concerning for arrhythmia, will evaluate with Zio patch x2 weeks  Hypertension: Continue losartan 100 mg daily   Hyperlipidemia: LDL 122 on 08/04/2020.  Has been unable to tolerate statins, recommend referral to lipd clinic for PCSK9 inhibitor  T2DM: On insulin.  A1c 8.9% on 08/04/2020.  Follows with PCP  OSA: unable to tolerate CPAP  Spinal lesion: noted to have area of sclerosis measuring 18 mm in T8 vertebral body on coronary CTA 08/15/2020, does have history prostate cancer and concerning that this could represent metastatic disease.  Referred  to oncology, PET scan ordered.  Aortic dilatation: Mild ascending aortic dilatation measuring 40 mm on CTA 08/15/2020.  Plan repeat CTA chest in 1 year   RTC in 3 months   Medication Adjustments/Labs  and Tests Ordered: Current medicines are reviewed at length with the patient today.  Concerns regarding medicines are outlined above.  Orders Placed This Encounter  Procedures   AMB Referral to Advanced Lipid Disorders Clinic   LONG TERM MONITOR (3-14 DAYS)      Meds ordered this encounter  Medications   isosorbide mononitrate (IMDUR) 30 MG 24 hr tablet    Sig: Take 0.5 tablets (15 mg total) by mouth daily.    Dispense:  45 tablet    Refill:  3    Dose decrease       Patient Instructions  Medication Instructions:  DECREASE isosorbide (Imdur) to 15 mg daily  *If you need a refill on your cardiac medications before your next appointment, please call your pharmacy*  Testing/Procedures: ZIO XT- Long Term Monitor Instructions   Your physician has requested you wear a ZIO patch monitor for _14__ days.  This is a single patch monitor.   [employer] supplies one patch monitor per enrollment. Additional stickers are not available. Please do not apply patch if you will be having a Nuclear Stress Test, Echocardiogram, Cardiac CT, MRI, or Chest Xray during the period you would be wearing the monitor. The patch cannot be worn during these tests. You cannot remove and re-apply the ZIO XT patch monitor.  Your ZIO patch monitor will be sent Fed Ex from [employer] directly to your home address. It may take 3-5 days to receive your monitor after you have been enrolled.  Once you have received your monitor, please review the enclosed instructions. Your monitor has already been registered assigning a specific monitor serial # to you.  Billing and Patient Assistance Program Information   We have supplied [employer] with any of your insurance information on file for billing purposes. [employer]  offers a sliding scale Patient Assistance Program for patients that do not have insurance, or whose insurance does not completely cover the cost of the ZIO monitor.   You must apply for the Patient Assistance Program to qualify for this discounted rate.     To apply, please call [employer] at [phone], select option 4, then select option 2, and ask to apply for Patient Assistance Program.  Theodore Demark will ask your household income, and how many people are in your household.  They will quote your out-of-pocket cost based on that information.  [employer] will also be able to set up a 49-month interest-free payment plan if needed.  Applying the monitor   Shave hair from upper left chest.  Hold abrader disc by orange tab. Rub abrader in 40 strokes over the upper left chest as indicated in your monitor instructions.  Clean area with 4 enclosed alcohol pads. Let dry.  Apply patch as indicated in monitor instructions. Patch will be placed under collarbone on left side of chest with arrow pointing upward.  Rub patch adhesive wings for 2 minutes. Remove white label marked "1". Remove the white label marked "2". Rub patch adhesive wings for 2 additional minutes.  While looking in a mirror, press and release button in center of patch. A small green light will flash 3-4 times. This will be your only indicator that the monitor has been turned on. ?  Do not shower for the first 24 hours. You may shower after the first 24 hours.  Press the button if you feel a symptom. You will hear a small click. Record Date, Time and Symptom in the Patient Logbook.  When you are ready to remove the patch,  follow instructions on the last 2 pages of the Patient Logbook. Stick patch monitor onto the last page of Patient Logbook.  Place Patient Logbook in the blue and white box.  Use locking tab on box and tape box closed securely.  The blue and white box has prepaid postage on it. Please place it in the mailbox as soon as possible. Your  physician should have your test results approximately 7 days after the monitor has been mailed back to [facility].  Call [place] at [phone] if you have questions regarding your ZIO XT patch monitor. Call them immediately if you see an orange light blinking on your monitor.  If your monitor falls off in less than 4 days, contact our Monitor department at [phone]. ?If your monitor becomes loose or falls off after 4 days call [employer] at [phone] for suggestions on securing your monitor.?  Follow-Up: At [facility], you and your health needs are our priority.  As part of our continuing mission to provide you with exceptional heart care, we have created designated Provider Care Teams.  These Care Teams include your primary Cardiologist (physician) and Advanced Practice Providers (APPs -  Physician Assistants and Nurse Practitioners) who all work together to provide you with the care you need, when you need it.  We recommend signing up for the patient portal called "MyChart".  Sign up information is provided on this After Visit Summary.  MyChart is used to connect with patients for Virtual Visits (Telemedicine).  Patients are able to view lab/test results, encounter notes, upcoming appointments, etc.  Non-urgent messages can be sent to your provider as well.   To learn more about what you can do with MyChart, go to [email or web address].    Your next appointment:   3 month(s)  The format for your next appointment:   In Person  Provider:   You will see one of the following Advanced Practice Providers on your designated Care Team:   Rosaria Ferries, PA-C Caron Presume, PA-C Jory Sims, DNP, ANP  Then, Dr. Gardiner Rhyme will plan to see you again in 6 month(s).   Other Instructions You have been referred to: Lipid clinic with our pharmacist     Signed, Donato Heinz, MD  09/18/2020 6:12 AM    [place]

## 2020-09-15 ENCOUNTER — Ambulatory Visit: Payer: PPO | Admitting: Hematology

## 2020-09-16 ENCOUNTER — Ambulatory Visit: Payer: PPO | Admitting: Cardiology

## 2020-09-16 ENCOUNTER — Ambulatory Visit (INDEPENDENT_AMBULATORY_CARE_PROVIDER_SITE_OTHER): Payer: PPO

## 2020-09-16 ENCOUNTER — Other Ambulatory Visit: Payer: Self-pay

## 2020-09-16 ENCOUNTER — Encounter: Payer: Self-pay | Admitting: Cardiology

## 2020-09-16 VITALS — BP 144/86 | HR 66 | Ht 69.0 in | Wt 174.6 lb

## 2020-09-16 DIAGNOSIS — I25118 Atherosclerotic heart disease of native coronary artery with other forms of angina pectoris: Secondary | ICD-10-CM

## 2020-09-16 DIAGNOSIS — E785 Hyperlipidemia, unspecified: Secondary | ICD-10-CM | POA: Diagnosis not present

## 2020-09-16 DIAGNOSIS — R002 Palpitations: Secondary | ICD-10-CM | POA: Diagnosis not present

## 2020-09-16 DIAGNOSIS — I10 Essential (primary) hypertension: Secondary | ICD-10-CM

## 2020-09-16 MED ORDER — ISOSORBIDE MONONITRATE ER 30 MG PO TB24: 15.0000 mg | ORAL_TABLET | Freq: Every day | ORAL | 3 refills | Status: DC

## 2020-09-16 NOTE — Patient Instructions (Signed)
Medication Instructions:  DECREASE isosorbide (Imdur) to 15 mg daily  *If you need a refill on your cardiac medications before your next appointment, please call your pharmacy*  Testing/Procedures: [place] Monitor Instructions   Your physician has requested you wear a ZIO patch monitor for _14__ days.  This is a single patch monitor.   [employer] supplies one patch monitor per enrollment. Additional stickers are not available. Please do not apply patch if you will be having a Nuclear Stress Test, Echocardiogram, Cardiac CT, MRI, or Chest Xray during the period you would be wearing the monitor. The patch cannot be worn during these tests. You cannot remove and re-apply the ZIO XT patch monitor.  Your ZIO patch monitor will be sent Fed Ex from [employer] directly to your home address. It may take 3-5 days to receive your monitor after you have been enrolled.  Once you have received your monitor, please review the enclosed instructions. Your monitor has already been registered assigning a specific monitor serial # to you.  Billing and Patient Assistance Program Information   We have supplied [employer] with any of your insurance information on file for billing purposes. [employer] offers a sliding scale Patient Assistance Program for patients that do not have insurance, or whose insurance does not completely cover the cost of the ZIO monitor.   You must apply for the Patient Assistance Program to qualify for this discounted rate.     To apply, please call [employer] at [phone], select option 4, then select option 2, and ask to apply for Patient Assistance Program.  Theodore Demark will ask your household income, and how many people are in your household.  They will quote your out-of-pocket cost based on that information.  [employer] will also be able to set up a 71-month interest-free payment plan if needed.  Applying the monitor   Shave hair from upper left chest.  Hold abrader disc by orange  tab. Rub abrader in 40 strokes over the upper left chest as indicated in your monitor instructions.  Clean area with 4 enclosed alcohol pads. Let dry.  Apply patch as indicated in monitor instructions. Patch will be placed under collarbone on left side of chest with arrow pointing upward.  Rub patch adhesive wings for 2 minutes. Remove white label marked "1". Remove the white label marked "2". Rub patch adhesive wings for 2 additional minutes.  While looking in a mirror, press and release button in center of patch. A small green light will flash 3-4 times. This will be your only indicator that the monitor has been turned on. ?  Do not shower for the first 24 hours. You may shower after the first 24 hours.  Press the button if you feel a symptom. You will hear a small click. Record Date, Time and Symptom in the Patient Logbook.  When you are ready to remove the patch, follow instructions on the last 2 pages of the Patient Logbook. Stick patch monitor onto the last page of Patient Logbook.  Place Patient Logbook in the blue and white box.  Use locking tab on box and tape box closed securely.  The blue and white box has prepaid postage on it. Please place it in the mailbox as soon as possible. Your physician should have your test results approximately 7 days [facility]  Call ITalmageat [phone] you have questions regarding your ZIO XT patch monitor. Call them immediately if  you see an orange light blinking on your monitor.  If your monitor falls off in less than 4 days, contact our Monitor department at [phone]. ?If your monitor becomes loose or falls off after 4 days call [employer] at [phone] for suggestions on securing your monitor.?  Follow-Up: At [facility], you and your health needs are our priority.  As part of our continuing mission to provide you with exceptional heart care, we have created designated Provider  Care Teams.  These Care Teams include your primary Cardiologist (physician) and Advanced Practice Providers (APPs -  Physician Assistants and Nurse Practitioners) who all work together to provide you with the care you need, when you need it.  We recommend signing up for the patient portal called "MyChart".  Sign up information is provided on this After Visit Summary.  MyChart is used to connect with patients for Virtual Visits (Telemedicine).  Patients are able to view lab/test results, encounter notes, upcoming appointments, etc.  Non-urgent messages can be sent to your provider as well.   To learn more about what you can do with MyChart, go to [email or web address].    Your next appointment:   3 month(s)  The format for your next appointment:   In Person  Provider:   You will see one of the following Advanced Practice Providers on your designated Care Team:   Rosaria Ferries, PA-C Caron Presume, PA-C Jory Sims, DNP, ANP  Then, Dr. Gardiner Rhyme will plan to see you again in 6 month(s).   Other Instructions You have been referred to: Lipid clinic with our pharmacist

## 2020-09-16 NOTE — Progress Notes (Unsigned)
Patient enrolled for [employer] to mail a 14 day ZIO XT monitor to his address on file.

## 2020-09-18 ENCOUNTER — Other Ambulatory Visit: Payer: Self-pay

## 2020-09-18 ENCOUNTER — Encounter (HOSPITAL_COMMUNITY)
Admission: RE | Admit: 2020-09-18 | Discharge: 2020-09-18 | Disposition: A | Discharge status: Home / Self Care | Payer: PPO | Source: Ambulatory Visit | Attending: Hematology | Admitting: Hematology

## 2020-09-18 DIAGNOSIS — M899 Disorder of bone, unspecified: Secondary | ICD-10-CM | POA: Diagnosis not present

## 2020-09-18 DIAGNOSIS — C7951 Secondary malignant neoplasm of bone: Secondary | ICD-10-CM | POA: Diagnosis not present

## 2020-09-18 LAB — GLUCOSE, CAPILLARY: Glucose-Capillary: 164 mg/dL — ABNORMAL HIGH (ref 70–99)

## 2020-09-18 MED ORDER — FLUDEOXYGLUCOSE F - 18 (FDG) INJECTION
9.6000 | Freq: Once | INTRAVENOUS | Status: AC
  Administered 2020-09-18: 9.6 via INTRAVENOUS

## 2020-09-20 DIAGNOSIS — R002 Palpitations: Secondary | ICD-10-CM | POA: Diagnosis not present

## 2020-09-24 ENCOUNTER — Other Ambulatory Visit: Payer: Self-pay

## 2020-09-24 ENCOUNTER — Inpatient Hospital Stay: Payer: PPO | Attending: Hematology | Admitting: Hematology

## 2020-09-24 VITALS — BP 153/79 | HR 68 | Temp 98.1°F | Resp 17 | Wt 173.6 lb

## 2020-09-24 DIAGNOSIS — G7000 Myasthenia gravis without (acute) exacerbation: Secondary | ICD-10-CM | POA: Insufficient documentation

## 2020-09-24 DIAGNOSIS — M899 Disorder of bone, unspecified: Secondary | ICD-10-CM | POA: Diagnosis not present

## 2020-09-24 DIAGNOSIS — I2510 Atherosclerotic heart disease of native coronary artery without angina pectoris: Secondary | ICD-10-CM | POA: Insufficient documentation

## 2020-09-24 DIAGNOSIS — Z8546 Personal history of malignant neoplasm of prostate: Secondary | ICD-10-CM | POA: Insufficient documentation

## 2020-09-24 DIAGNOSIS — Z79899 Other long term (current) drug therapy: Secondary | ICD-10-CM | POA: Diagnosis not present

## 2020-10-01 NOTE — Progress Notes (Signed)
Marland Kitchen   HEMATOLOGY/ONCOLOGY CONSULTATION NOTE  Date of Service: 10/01/2020  Patient Care Team: Cyndi Bender, Hershal Coria as PCP - General (Physician Assistant)  CHIEF COMPLAINTS/PURPOSE OF CONSULTATION:  Bone lesion on spine.  HISTORY OF PRESENTING ILLNESS:   Mario Burns is a wonderful 75 y.o. male who has been referred to [place] by Dr Oswaldo Milian for evaluation and management of incidentally noted sclerotic spinal lesion noted on CT coronary angiogram and cardiac catheterization.  Patient has a history of prostate cancer status post prostatectomy, coronary disease, hiatal hernia, hypertension, dyslipidemia who recently had a CT coronary angiogram to evaluate his chest tightness.  This showed severe coronary disease and he subsequently had a cardiac catheterization on 08/26/2020 which showed occlusive disease in his mid and distal LAD with normal ejection fraction.  Recommended medical therapy.  Patient also has a history of myasthenia gravis which he follows with Dr. Jannifer Franklin from neurology for management.  Patient notes that since his cardiac catheterization he has noted numbness and paresthesias in his lips and tongue.  He was recommended to call Dr. Jannifer Franklin immediately to evaluate if this is worsening myasthenia or if he is having strokelike symptoms.  Patient did not call his neurologist or cardiologist to evaluate this previously.  No specific focal new back pains. No new urinary symptoms.  No change in bowel habits.  No other acute new focal symptoms.  INTERVAL HISTORY  Patient is here for follow-up to discuss the results of his labs and PET CT scan. Labs done on 09/03/2020 CBC within normal limits CMP unremarkable with normal creatinine and calcium levels LDH within normal limits at 196 PSA less than 0.1 Myeloma panel shows no M spike Serum free light chains within normal limits Beta-2 microglobulin within normal limits 25 hydroxy vitamin D 37.5 TSH 0.747, free T4  1.18  Patient's PET CT scan done on 09/18/2020 showed no metabolic activity associated with solitary lucent lesion in the mid thoracic spine favor benign etiology.  No evidence of malignancy on whole-body FDG PET CT scan.    MEDICAL HISTORY:  Past Medical History:  Diagnosis Date   Arthritis    Bladder neck contracture    Borderline hyperlipidemia    Claustrophobia    Complication of anesthesia    CLAUSTROPHOBIC   GERD (gastroesophageal reflux disease)    H/O hiatal hernia    History of prostate cancer    S/P PROSTATECTOMY   History of thyroid nodule    LARGE GOITER   Hypertension    Hypothyroidism, postsurgical    MG (myasthenia gravis) ([place])    DX 2013 --  NEUROLOGIST--  DR WILLIS/  TREATED W/ MEDICATION UNTIL SYMPTOMS RESOLVED -- NO REMISSION--  SEES DR WILLIS PRN   OSA (obstructive sleep apnea)    NON-COMPLIANT CPAP--  CLAUSTROPHOBIC   Shortness of breath    ALWAYS--HAD FOR YEARS - STATES HE CAN'T DO HARDLY ANYTHING WITHOUT GIVING OUT   Type 2 diabetes mellitus (HCC)    Weak urinary stream     SURGICAL HISTORY: Past Surgical History:  Procedure Laterality Date   CYSTOSCOPY N/A 07/30/2013   Procedure: CYSTOSCOPY WITH Anastasio Champion ;  Surgeon: Bernestine Amass, MD;  Location: [facility];  Service: Urology;  Laterality: N/A;   CYSTOSCOPY WITH URETHRAL DILATATION N/A 08/02/2012   Procedure: CYSTOSCOPY WITH URETHRAL DILATATION, AN REMOVAL STAPLE FROM BLADDER NECK;  Surgeon: Bernestine Amass, MD;  Location: [facility];  Service: Urology;  Laterality: N/A;   HEMORRHOID SURGERY  [place] N/A 04/23/2013   Procedure: LAPAROSCOPIC INCISIONAL HERNIA;  Surgeon: [name], MD;  Location: [facility];  Service: General;  Laterality: N/A;   INSERTION OF MESH N/A 04/23/2013   Procedure: INSERTION OF MESH;  Surgeon: [name], MD;  Location: [facility];  Service: General;  Laterality: N/A;   LEFT HEART CATH AND CORONARY  ANGIOGRAPHY N/A 08/26/2020   Procedure: LEFT HEART CATH AND CORONARY ANGIOGRAPHY;  Surgeon: Jettie Booze, MD;  Location: [facility];  Service: Cardiovascular;  Laterality: N/A;   ROBOT ASSISTED LAPAROSCOPIC RADICAL PROSTATECTOMY  12/15/2011   Procedure: ROBOTIC ASSISTED LAPAROSCOPIC RADICAL PROSTATECTOMY;  Surgeon: Bernestine Amass, MD;  Location: [facility];  Service: Urology;  Laterality: N/A;   TOTAL THYROIDECTOMY  05-24-2003    SOCIAL HISTORY: Social History   Socioeconomic History   Marital status: Married    Spouse name: Doris   Number of children: 1   Years of education: 10th   Highest education level: Not on file  Occupational History   Occupation: semi-retired  Tobacco Use   Smoking status: Never   Smokeless tobacco: Never  Vaping Use   Vaping Use: Never used  Substance and Sexual Activity   Alcohol use: No   Drug use: No   Sexual activity: Not on file  Other Topics Concern   Not on file  Social History Narrative   Lives at home with his wife.   Right-handed.   2-3 cups caffeine per day.   Social Determinants of Health   Financial Resource Strain: Not on file  Food Insecurity: Not on file  Transportation Needs: Not on file  Physical Activity: Not on file  Stress: Not on file  Social Connections: Not on file  Intimate Partner Violence: Not on file    FAMILY HISTORY: Family History  Problem Relation Age of Onset   Heart disease Mother    Glaucoma Mother    Hypertension Mother    Colon cancer Father    Diabetes Sister    Diabetes Brother    Diabetes Brother    Cancer Brother     ALLERGIES:  is allergic to sulfa antibiotics.  MEDICATIONS:  Current Outpatient Medications  Medication Sig Dispense Refill   acetaminophen (TYLENOL) 500 MG tablet Take 1,000 mg by mouth in the morning.     aspirin EC 81 MG tablet Take 1 tablet (81 mg total) by mouth daily. Swallow whole. 90 tablet 3   azaTHIOprine (IMURAN) 50 MG tablet Take 1 tablet in the  morning and 2 in the evening 270 tablet 1   glimepiride (AMARYL) 4 MG tablet Take 4 mg by mouth 2 (two) times daily.     isosorbide mononitrate (IMDUR) 30 MG 24 hr tablet Take 0.5 tablets (15 mg total) by mouth daily. 45 tablet 3   levothyroxine (SYNTHROID) 150 MCG tablet Take 150 mcg by mouth daily before breakfast.     losartan (COZAAR) 100 MG tablet Take 100 mg by mouth daily.     nitroGLYCERIN (NITROSTAT) 0.4 MG SL tablet Place 1 tablet (0.4 mg total) under the tongue every 5 (five) minutes as needed. 25 tablet 3   omeprazole (PRILOSEC) 20 MG capsule Take 20 mg by mouth every evening.      ONE TOUCH ULTRA TEST test strip      ONETOUCH DELICA LANCETS 51O MISC      predniSONE (DELTASONE) 5 MG tablet Begin taking 6 tablets daily, taper by one tablet daily until  off the medication. (Patient not taking: No sig reported) 21 tablet 0   pyridostigmine (MESTINON) 60 MG tablet TAKE 1/2 (ONE-HALF) TABLET BY MOUTH EVERY 4 HOURS WHILE AWAKE (Patient taking differently: Take 30 mg by mouth 3 (three) times daily.) 180 tablet 0   TOUJEO MAX SOLOSTAR 300 UNIT/ML SOPN Inject 18 Units into the skin at bedtime.     No current facility-administered medications for this visit.    REVIEW OF SYSTEMS:   .10 Point review of Systems was done is negative except as noted above.   PHYSICAL EXAMINATION: ECOG PERFORMANCE STATUS: 1 - Symptomatic but completely ambulatory  . Vitals:   09/24/20 1004  BP: (!) 153/79  Pulse: 68  Resp: 17  Temp: 98.1 F (36.7 C)  SpO2: 99%   Filed Weights   09/24/20 1004  Weight: 173 lb 9.6 oz (78.7 kg)   .Body mass index is 25.64 kg/m. Marland Kitchen GENERAL:alert, in no acute distress and comfortable SKIN: no acute rashes, no significant lesions EYES: conjunctiva are pink and non-injected, sclera anicteric OROPHARYNX: MMM, no exudates, no oropharyngeal erythema or ulceration NECK: supple, no JVD LYMPH:  no palpable lymphadenopathy in the cervical, axillary or inguinal  regions LUNGS: clear to auscultation b/l with normal respiratory effort HEART: regular rate & rhythm ABDOMEN:  normoactive bowel sounds , non tender, not distended. Extremity: no pedal edema PSYCH: alert & oriented x 3 with fluent speech NEURO: no focal motor/sensory deficits   LABORATORY DATA:  I have reviewed the data as listed  . CBC Latest Ref Rng & Units 09/03/2020 08/21/2020 05/26/2020  WBC 4.0 - 10.5 K/uL 6.2 5.5 5.3  Hemoglobin 13.0 - 17.0 g/dL 13.9 13.8 13.7  Hematocrit 39.0 - 52.0 % 40.9 41.9 40.6  Platelets 150 - 400 K/uL 249 223 222    . CMP Latest Ref Rng & Units 09/03/2020 08/21/2020 08/11/2020  Glucose 70 - 99 mg/dL 135(H) 200(H) 209(H)  BUN 8 - 23 mg/dL 19 15 18   Creatinine 0.61 - 1.24 mg/dL 0.86 0.75(L) 0.92  Sodium 135 - 145 mmol/L 137 134 139  Potassium 3.5 - 5.1 mmol/L 4.1 4.6 4.7  Chloride 98 - 111 mmol/L 105 100 102  CO2 22 - 32 mmol/L 23 22 17(L)  Calcium 8.9 - 10.3 mg/dL 9.7 9.6 9.8  Total Protein 6.5 - 8.1 g/dL 7.2 - -  Total Bilirubin 0.3 - 1.2 mg/dL 0.9 - -  Alkaline Phos 38 - 126 U/L 88 - -  AST 15 - 41 U/L 23 - -  ALT 0 - 44 U/L 32 - -     RADIOGRAPHIC STUDIES: I have personally reviewed the radiological images as listed and agreed with the findings in the report. NM PET Image Initial (PI) Whole Body  Result Date: 09/19/2020 CLINICAL DATA:  Initial treatment strategy for thoracic vertebral body bone lesion. EXAM: NUCLEAR MEDICINE PET WHOLE BODY TECHNIQUE: 9.6 mCi F-18 FDG was injected intravenously. Full-ring PET imaging was performed from the head to foot after the radiotracer. CT data was obtained and used for attenuation correction and anatomic localization. Fasting blood glucose: 160 mg/dl COMPARISON:  Partial chest CT 08/15/2020 FINDINGS: Mediastinal blood pool activity: SUV max 2.5 HEAD/NECK: No hypermetabolic activity in the scalp. No hypermetabolic cervical lymph nodes. Incidental CT findings: none CHEST: No hypermetabolic mediastinal or hilar  nodes. No suspicious pulmonary nodules on the CT scan. Incidental CT findings: none ABDOMEN/PELVIS: No abnormal hypermetabolic activity within the liver, pancreas, adrenal glands, or spleen. No hypermetabolic lymph nodes in the abdomen or  pelvis. Incidental CT findings: Post prostatectomy. No pelvic lymphadenopathy. SKELETON: Solitary lucent lesion with central trabeculation in the midthoracic spine (T8) has no associated metabolic activity. No additional lytic or sclerotic lesions identified within the skeleton. Hypermetabolic lesions within the skeleton. Incidental CT findings: none EXTREMITIES: No abnormal hypermetabolic activity in the lower extremities. Incidental CT findings: none IMPRESSION: 1. No metabolic activity associated with solitary lucent lesion in the midthoracic spine. Favor benign etiology. 2. No evidence of malignancy on whole-body FDG PET scan. Electronically Signed   By: Suzy Bouchard M.D.   On: 09/19/2020 14:18    ASSESSMENT & PLAN:   75 year old male with  #1 incidentally noted T8 1.8 cm sclerotic lesion. This was incidentally noted on CT coronary angiogram. Patient does not have any specific focal pain associated with this.  #2 history of prostate cancer.  Not currently on any treatment.  #3 history of coronary artery disease.  #4 history of myasthenia gravis on Imuran and pyridostigmine follows with Dr. Jannifer Franklin.  Plan Discussed his labs and available imaging study in detail with the patient. -Labs done on 09/03/2020 CBC within normal limits CMP unremarkable with normal creatinine and calcium levels LDH within normal limits at 196 PSA less than 0.1 Myeloma panel shows no M spike Serum free light chains within normal limits Beta-2 microglobulin within normal limits 25 hydroxy vitamin D 37.5 TSH 0.747, free T4 1.18  Patient's PET CT scan done on 09/18/2020 showed no metabolic activity associated with solitary lucent lesion in the mid thoracic spine favor benign  etiology.  No evidence of malignancy on whole-body FDG PET CT scan.  -Continues to follow with his cardiologist for his chest symptoms and for his neurologist for his myasthenia gravis. Currently has no back pain and prefers to follow-up with his PCP regarding this.  Would recommend getting an MRI of the thoracic spine in 2 to 3 months to evaluate the T8 lesion in the interval.  Patient prefers to do this with his primary care physician.   Follow-up RTC with PCP. Call us if u decide to get additional workup.   No orders of the defined types were placed in this encounter.    All of the patients questions were answered with apparent satisfaction. The patient knows to call the clinic with any problems, questions or concerns.  . The total time spent in the appointment was 25 minutes and more than 50% was on counseling and direct patient cares.     Sullivan Lone MD [place] AAHIVMS [facility] Hematology/Oncology Physician [facility]  (Office):       [phone] (Work cell):  [phone] (Fax):           [phone]

## 2020-10-09 DIAGNOSIS — R002 Palpitations: Secondary | ICD-10-CM | POA: Diagnosis not present

## 2020-10-10 ENCOUNTER — Ambulatory Visit: Payer: PPO

## 2020-11-07 DIAGNOSIS — E7800 Pure hypercholesterolemia, unspecified: Secondary | ICD-10-CM | POA: Diagnosis not present

## 2020-11-07 DIAGNOSIS — E890 Postprocedural hypothyroidism: Secondary | ICD-10-CM | POA: Diagnosis not present

## 2020-11-07 DIAGNOSIS — M899 Disorder of bone, unspecified: Secondary | ICD-10-CM | POA: Diagnosis not present

## 2020-11-07 DIAGNOSIS — G7000 Myasthenia gravis without (acute) exacerbation: Secondary | ICD-10-CM | POA: Diagnosis not present

## 2020-11-07 DIAGNOSIS — I2510 Atherosclerotic heart disease of native coronary artery without angina pectoris: Secondary | ICD-10-CM | POA: Diagnosis not present

## 2020-11-07 DIAGNOSIS — M1711 Unilateral primary osteoarthritis, right knee: Secondary | ICD-10-CM | POA: Diagnosis not present

## 2020-11-07 DIAGNOSIS — C61 Malignant neoplasm of prostate: Secondary | ICD-10-CM | POA: Diagnosis not present

## 2020-11-07 DIAGNOSIS — Z139 Encounter for screening, unspecified: Secondary | ICD-10-CM | POA: Diagnosis not present

## 2020-11-07 DIAGNOSIS — E1165 Type 2 diabetes mellitus with hyperglycemia: Secondary | ICD-10-CM | POA: Diagnosis not present

## 2020-11-07 DIAGNOSIS — I10 Essential (primary) hypertension: Secondary | ICD-10-CM | POA: Diagnosis not present

## 2020-11-07 DIAGNOSIS — I700 Atherosclerosis of aorta: Secondary | ICD-10-CM | POA: Diagnosis not present

## 2020-11-07 DIAGNOSIS — Z9181 History of falling: Secondary | ICD-10-CM | POA: Diagnosis not present

## 2020-11-07 DIAGNOSIS — Z1331 Encounter for screening for depression: Secondary | ICD-10-CM | POA: Diagnosis not present

## 2020-11-12 ENCOUNTER — Encounter: Payer: Self-pay | Admitting: *Deleted

## 2020-12-03 ENCOUNTER — Ambulatory Visit: Payer: PPO | Admitting: Neurology

## 2020-12-11 NOTE — Progress Notes (Signed)
Cardiology Office Note   Date:  12/12/2020   ID:  Mario Burns, DOB [birth year], MRN [id number]  PCP:  Cyndi Bender, PA-C  Cardiologist: Dr. Gardiner Rhyme  CC: Follow up CAD    History of Present Illness: Mario Burns is a 75 y.o. male who presents for ongoing assessment and management of hypertension, with history of shortness of breath and chest discomfort, described as sharp sometimes pressure in the center of his chest.  He occasionally wakes up with that at nighttime.  He was seen by Dr. Nechama Guard on 09/16/2020.  That note revealed that he had a EF of 50 to 55% per echo in August 2022 with grade 1 diastolic dysfunction, no significant valvular disease, with mild dilatation of the ascending aorta measuring 41 mm.  It was also noted that he had a coronary CTA on 08/15/2020 with a calcium score of 0 with probable total occlusion of the mid LAD, mid ascending aortic dilatation measuring 40 mm.  He was also noted to have aortic sclerosis measuring 10 mm and T8 vertebral body.  He had a follow-up cardiac catheterization on 08/26/2020 showing chronic total occlusion of the mid LAD with collaterals filling to the mid and distal LAD, 60% left circumflex, 50% proximal to mid left circumflex.  Other history includes OSA unable to tolerate CPAP, history of type 2 diabetes, spinal lesion, prostate cancer, and myasthenia gravis.  When seen in the office on 09/16/2020 he continued to complain of sharp chest pain, left-sided to central chest pain which lasted approximately 1 minute.  He also complained of some palpitations.  Dr. Alda Lea felt that this was noncardiac chest pain.  He did however, begin him on Imdur 15 mg.  He was not started on a beta-blocker in the setting of myasthenia gravis.  He was also referred to the lipid clinic for PCSK9 inhibition as he was unable to tolerate statins.  Because of his complaints of palpitations, the patient was started on a Zio patch for 2 weeks for evaluation of arrhythmias.     Cardiac monitor which was read on 10/09/2020 revealed 1 episode of NSVT lasting 6 beats, 9 episodes of SVT longest lasting 12 beats, and second-degree AV block Mobitz 1 was present.  It was determined by Dr. Alda Lea that no further work-up was recommended and he was to continue his current regimen  He comes today with concerns about taking statin medication.  He did not tolerate others in the past.  He has seen his PCP who is suggested that he take half a tablet each day for a few weeks to see how well he is doing on it and then if tolerating he should go up to a full tablet each day.  He also has questions about his coronary artery disease, where exactly the blockages are and what his prognosis is and plans.  Past Medical History:  Diagnosis Date   Arthritis    Bladder neck contracture    Borderline hyperlipidemia    Claustrophobia    Complication of anesthesia    CLAUSTROPHOBIC   GERD (gastroesophageal reflux disease)    H/O hiatal hernia    History of prostate cancer    S/P PROSTATECTOMY   History of thyroid nodule    LARGE GOITER   Hypertension    Hypothyroidism, postsurgical    MG (myasthenia gravis) ([place])    DX 2013 --  NEUROLOGIST--  DR WILLIS/  TREATED W/ MEDICATION UNTIL SYMPTOMS RESOLVED -- NO REMISSION--  SEES DR WILLIS PRN  OSA (obstructive sleep apnea)    NON-COMPLIANT CPAP--  CLAUSTROPHOBIC   Shortness of breath    ALWAYS--HAD FOR YEARS - STATES HE CAN'T DO HARDLY ANYTHING WITHOUT GIVING OUT   Type 2 diabetes mellitus ([place])    Weak urinary stream     Past Surgical History:  Procedure Laterality Date   CYSTOSCOPY N/A 07/30/2013   Procedure: CYSTOSCOPY WITH Anastasio Champion ;  Surgeon: Bernestine Amass, MD;  Location: [facility];  Service: Urology;  Laterality: N/A;   CYSTOSCOPY WITH URETHRAL DILATATION N/A 08/02/2012   Procedure: CYSTOSCOPY WITH URETHRAL DILATATION, AN REMOVAL STAPLE FROM BLADDER NECK;  Surgeon: Bernestine Amass, MD;  Location: [facility];  Service: Urology;  Laterality: N/A;   HEMORRHOID SURGERY  1990's   INCISIONAL HERNIA REPAIR N/A 04/23/2013   Procedure: LAPAROSCOPIC INCISIONAL HERNIA;  Surgeon: [name], MD;  Location: [facility];  Service: General;  Laterality: N/A;   INSERTION OF MESH N/A 04/23/2013   Procedure: INSERTION OF MESH;  Surgeon: [name], MD;  Location: [facility];  Service: General;  Laterality: N/A;   LEFT HEART CATH AND CORONARY ANGIOGRAPHY N/A 08/26/2020   Procedure: LEFT HEART CATH AND CORONARY ANGIOGRAPHY;  Surgeon: Jettie Booze, MD;  Location: [place] [facility];  Service: Cardiovascular;  Laterality: N/A;   ROBOT ASSISTED LAPAROSCOPIC RADICAL PROSTATECTOMY  12/15/2011   Procedure: ROBOTIC ASSISTED LAPAROSCOPIC RADICAL PROSTATECTOMY;  Surgeon: Bernestine Amass, MD;  Location: [facility];  Service: Urology;  Laterality: N/A;   TOTAL THYROIDECTOMY  05-24-2003     Current Outpatient Medications  Medication Sig Dispense Refill   acetaminophen (TYLENOL) 500 MG tablet Take 1,000 mg by mouth in the morning.     aspirin EC 81 MG tablet Take 1 tablet (81 mg total) by mouth daily. Swallow whole. 90 tablet 3   azaTHIOprine (IMURAN) 50 MG tablet Take 1 tablet in the morning and 2 in the evening 270 tablet 1   glimepiride (AMARYL) 4 MG tablet Take 4 mg by mouth 2 (two) times daily.     isosorbide mononitrate (IMDUR) 30 MG 24 hr tablet Take 0.5 tablets (15 mg total) by mouth daily. 45 tablet 3   levothyroxine (SYNTHROID) 150 MCG tablet Take 150 mcg by mouth daily before breakfast.     losartan (COZAAR) 100 MG tablet Take 100 mg by mouth daily.     omeprazole (PRILOSEC) 20 MG capsule Take 20 mg by mouth every evening.      ONE TOUCH ULTRA TEST test strip      ONETOUCH DELICA LANCETS 27O MISC      predniSONE (DELTASONE) 5 MG tablet Begin taking 6 tablets daily, taper by one tablet daily until off the medication. 21 tablet 0   pyridostigmine (MESTINON) 60 MG tablet TAKE 1/2 (ONE-HALF)  TABLET BY MOUTH EVERY 4 HOURS WHILE AWAKE (Patient taking differently: Take 30 mg by mouth 3 (three) times daily.) 180 tablet 0   TOUJEO MAX SOLOSTAR 300 UNIT/ML SOPN Inject 18 Units into the skin at bedtime.     nitroGLYCERIN (NITROSTAT) 0.4 MG SL tablet Place 1 tablet (0.4 mg total) under the tongue every 5 (five) minutes as needed. 25 tablet 3   No current facility-administered medications for this visit.    Allergies:   Sulfa antibiotics    Social History:  The patient  reports that he has never smoked. He has never used smokeless tobacco. He reports that he does not drink alcohol and does not use  drugs.   Family History:  The patient's family history includes Cancer in his brother; Colon cancer in his father; Diabetes in his brother, brother, and sister; Glaucoma in his mother; Heart disease in his mother; Hypertension in his mother.    ROS: All other systems are reviewed and negative. Unless otherwise mentioned in H&P    PHYSICAL EXAM: VS:  BP 138/82   Pulse 65   Ht 5\' 8"  (1.727 m)   Wt 176 lb 12.8 oz (80.2 kg)   SpO2 98%   BMI 26.88 kg/m  , BMI Body mass index is 26.88 kg/m. GEN: Well nourished, well developed, in no acute distress HEENT: normal Neck: no JVD, carotid bruits, or masses Cardiac: RRR; no murmurs, rubs, or gallops,no edema  Respiratory:  Clear to auscultation bilaterally, normal work of breathing GI: soft, nontender, nondistended, + BS MS: no deformity or atrophy Skin: warm and dry, no rash Neuro:  Strength and sensation are intact Psych: euthymic mood, full affect   EKG:  EKG is not ordered today.    Recent Labs: 09/03/2020: ALT 32; BUN 19; Creatinine 0.86; Hemoglobin 13.9; Platelet Count 249; Potassium 4.1; Sodium 137; TSH 0.747    Lipid Panel No results found for: CHOL, TRIG, HDL, CHOLHDL, VLDL, LDLCALC, LDLDIRECT    Wt Readings from Last 3 Encounters:  12/12/20 176 lb 12.8 oz (80.2 kg)  09/24/20 173 lb 9.6 oz (78.7 kg)  09/16/20 174 lb 9.6  oz (79.2 kg)      Other studies Reviewed: Cardiac Catheterization (Left) 08/26/2020   Mid Cx lesion is 60% stenosed.   Prox Cx to Mid Cx lesion is 50% stenosed.   Mid LAD lesion is 100% stenosed.   Dist LAD lesion is 100% stenosed.   The left ventricular systolic function is normal.   LV end diastolic pressure is normal.   The left ventricular ejection fraction is 55-65% by visual estimate.   There is no aortic valve stenosis.   Chronic total occlusion of the mid LAD.  There may be a distal LAD occlusion as well.  Collaterals fill the mid to distal LAD.  Medical therapy.  Sclerotic spine lesion currently under investigation.    Echocardiogram 08/14/2020 1. Left ventricular ejection fraction, by estimation, is 50 to 55%. The  left ventricle has low normal function. The left ventricle has no regional  wall motion abnormalities. Left ventricular diastolic parameters are  consistent with Grade I diastolic  dysfunction (impaired relaxation).   2. Right ventricular systolic function is normal. The right ventricular  size is normal.   3. The mitral valve is normal in structure. Trivial mitral valve  regurgitation. No evidence of mitral stenosis.   4. The aortic valve is tricuspid. Aortic valve regurgitation is trivial.  Mild aortic valve sclerosis is present, with no evidence of aortic valve  stenosis.   5. Aortic dilatation noted. There is mild dilatation of the ascending  aorta, measuring 41 mm.   ASSESSMENT AND PLAN:  1.  Coronary artery disease: I have pulled up his cardiac catheterization report and illustration.  I have printed this for him and gone over each of the coronary arteries that are pictured there to include the blockages and the collaterals.  I have given him explanation concerning where the blockages are and where the collateral circulation is.  I have answered questions concerning his curiosity about further blockages and plans to prevent.  Both he and his wife  verbalized understanding.  He has been advised to continue exercise regimen,  low-cholesterol diet, he is to continue management of diabetes, and blood pressure in order to continue his secondary prevention measures for heart disease.  2.  Hyperlipidemia: Due to new guidelines, the patient's LDL will now be less than 50, with total cholesterol less than 150.  He is going to start taking Crestor half a tablet every day for couple weeks and then go up on the dose to a full tablet daily moving from 5 mg a day to 10 mg a day.  If he is unable to tolerate this we will revisit PCSK9 inhibition and may refer to lipid clinic with Dr. Debara Pickett.  I have spoken with him about this and he is in agreement to do so as long as the cost is not too extravagant.  I have assured him that once he meets criteria he may be able to have this covered by his insurance but we will see what his levels are intolerance of statin therapy will be on follow-up appointment with Dr. Nechama Guard labs are being drawn by PCP on follow-up appointment.  3. Hypertension blood pressure well controlled, continue isosorbide mononitrate 15 mg daily and losartan 100 mg daily.  Current medicines are reviewed at length with the patient today.  I have spent 30 minutes  dedicated to the care of this patient on the date of this encounter to include pre-visit review of records, assessment, management and diagnostic testing,with shared decision making.  Labs/ tests ordered today include: None history of heart failure patient Phill Myron. [place] Pugh, ANP, AACC   12/12/2020 11:28 AM    [facility] [address] [address] Office [phone] Fax [phone]  Notice: This dictation was prepared with Dragon dictation along with smaller phrase technology. Any transcriptional errors that result from this process are unintentional and may not be corrected upon review.

## 2020-12-12 ENCOUNTER — Encounter: Payer: Self-pay | Admitting: Adult Health

## 2020-12-12 ENCOUNTER — Ambulatory Visit: Payer: PPO | Admitting: Adult Health

## 2020-12-12 ENCOUNTER — Other Ambulatory Visit: Payer: Self-pay

## 2020-12-12 VITALS — BP 138/82 | HR 65 | Ht 68.0 in | Wt 176.8 lb

## 2020-12-12 DIAGNOSIS — E7800 Pure hypercholesterolemia, unspecified: Secondary | ICD-10-CM

## 2020-12-12 DIAGNOSIS — I2510 Atherosclerotic heart disease of native coronary artery without angina pectoris: Secondary | ICD-10-CM

## 2020-12-12 DIAGNOSIS — I10 Essential (primary) hypertension: Secondary | ICD-10-CM

## 2020-12-12 NOTE — Patient Instructions (Signed)
Medication Instructions:  No Changes *If you need a refill on your cardiac medications before your next appointment, please call your pharmacy*   Lab Work: No Labs If you have labs (blood work) drawn today and your tests are completely normal, you will receive your results only by: [facility] (if you have MyChart) OR A paper copy in the mail If you have any lab test that is abnormal or we need to change your treatment, we will call you to review the results.   Testing/Procedures: No Testing   Follow-Up: At [facility], you and your health needs are our priority.  As part of our continuing mission to provide you with exceptional heart care, we have created designated Provider Care Teams.  These Care Teams include your primary Cardiologist (physician) and Advanced Practice Providers (APPs -  Physician Assistants and Nurse Practitioners) who all work together to provide you with the care you need, when you need it.  We recommend signing up for the patient portal called "MyChart".  Sign up information is provided on this After Visit Summary.  MyChart is used to connect with patients for Virtual Visits (Telemedicine).  Patients are able to view lab/test results, encounter notes, upcoming appointments, etc.  Non-urgent messages can be sent to your provider as well.   To learn more about what you can do with MyChart, go to [email or web address].    Your next appointment:   February 09, 2021 8:20 AM  The format for your next appointment:   In Person  Provider:   Oswaldo Milian, MD

## 2020-12-24 ENCOUNTER — Other Ambulatory Visit: Payer: Self-pay

## 2020-12-24 MED ORDER — PYRIDOSTIGMINE BROMIDE 60 MG PO TABS: 30.0000 mg | ORAL_TABLET | Freq: Three times a day (TID) | ORAL | 0 refills | Status: DC

## 2020-12-24 NOTE — Progress Notes (Signed)
Rx refilled.

## 2021-01-08 ENCOUNTER — Ambulatory Visit: Payer: PPO | Admitting: Neurology

## 2021-01-08 ENCOUNTER — Encounter: Payer: Self-pay | Admitting: Neurology

## 2021-01-08 VITALS — BP 155/76 | HR 64 | Ht 68.0 in | Wt 176.0 lb

## 2021-01-08 DIAGNOSIS — G7001 Myasthenia gravis with (acute) exacerbation: Secondary | ICD-10-CM

## 2021-01-08 DIAGNOSIS — G7000 Myasthenia gravis without (acute) exacerbation: Secondary | ICD-10-CM

## 2021-01-08 DIAGNOSIS — Z79899 Other long term (current) drug therapy: Secondary | ICD-10-CM

## 2021-01-08 HISTORY — DX: Myasthenia gravis without (acute) exacerbation: G70.00

## 2021-01-08 HISTORY — DX: Other long term (current) drug therapy: Z79.899

## 2021-01-08 MED ORDER — AZATHIOPRINE 50 MG PO TABS: 100.0000 mg | ORAL_TABLET | Freq: Two times a day (BID) | ORAL | 4 refills | Status: DC

## 2021-01-08 NOTE — Progress Notes (Signed)
Chief Complaint  Patient presents with   New Patient (Initial Visit)    Rm 12, with wife  States he still has difficulty speaking and swallowing,.      ASSESSMENT AND PLAN  Mario Burns is a 76 y.o. male   Long history of myasthenia gravis  Moderate bulbar weakness, mild to moderate limb muscle weakness on examination,  He complains of chewing swallowing difficulty, dysarthria,  Needs better control of his symptoms, will titrating up Imuran 50 mg to 100 mg twice a day, encouraged him Mestinon 60 mg half tablets 3 times a day, if he can tolerate, may try 60 mg twice a day  Laboratory evaluation, including acetylcholine receptor antibody   DIAGNOSTIC DATA (LABS, IMAGING, TESTING) - I reviewed patient records, labs, notes, testing and imaging myself where available.   MEDICAL HISTORY:  Mario Burns is a 76 year old male, accompanied by his wife to follow-up seropositive generalized myasthenia gravis, he is accompanied by his wife at today's visit on January 08, 2021, his primary care is PA Cyndi Bender  I reviewed and summarized the referring note. PMHX HTN DM HLD CAD Thyroidectomy, on supplement History of prostate cancer, s/p proctectomy.  He was diagnosed with myasthenia gravis in 2008, presented with severe dysarthria, chewing swallowing difficulty, at is worse, he take more than 1 hour to finish a hamburger, he has been managed by Dr. Jannifer Franklin over the years, was treated with prednisone, Mestinon, and Imuran, for couple years, then his symptoms has improved so much, he stopped medication and  follow-up.  In summer of 2019, he began to experience weakness in his legs, problems chewing, swallowing, fatigue during the male, he was started on prednisone 20 mg daily, Mestinon 30 mg 3 times a day, CellCept 500 mg twice daily in October 2019, he only took 1 CellCept complains of GI side effect, stopped taking medications, could not tolerate more than 30 mg twice a day of  Mestinon due to diarrhea,  His symptoms again improved with treatment, had a second round of steroid, Decadron 2 mg daily at the end of 2019, was placed on Imuran 50 mg twice a day, also investigate the possibility of IVIG treatment, because of the high co-pay, he could not afford it  Over the last couple years, he continues to deal with variable degree of chewing difficulty, swallowing difficulty, mild slurred speech, fatigue, gait abnormality, his multiple joints pain also contributed  He is currently taking Imuran 50 mg twice a day, previous attempt of 50/100, he complains of GI side effect with 100 mg, Mestinon 30 mg twice a day, He was also recently diagnosed with coronary artery disease, he complains of difficulty swallowing, especially tough meat, doing well with mashed potato, even sandwiches, egg cause difficulty sometimes, he was diagnosed with obstructive sleep apnea more than 20 years ago, could not tolerate CPAP machine, has been sleeping in his recliner seems, also complains of frequent urination due to his prostate issues  CT chest in 2013  1.  No evidence of anterior mediastinal mass.  2.  5 cm hiatal hernia. Previous total thyroidectomy.  3.  Otherwise, no significant abnormalities.     PHYSICAL EXAM:   Vitals:   01/08/21 0905  BP: (!) 155/76  Pulse: 64  Weight: 176 lb (79.8 kg)  Height: 5\' 8"  (1.727 m)   Not recorded     Body mass index is 26.76 kg/m.  PHYSICAL EXAMNIATION:  Gen: NAD, conversant, well nourised, well groomed  Cardiovascular: Regular rate rhythm, no peripheral edema, warm, nontender. Eyes: Conjunctivae clear without exudates or hemorrhage Neck: Supple, no carotid bruits. Pulmonary: Clear to auscultation bilaterally   NEUROLOGICAL EXAM:  MENTAL STATUS: Speech:    Speech is normal; fluent and spontaneous with normal comprehension.  Cognition:     Orientation to time, place and person     Normal recent and remote memory      Normal Attention span and concentration     Normal Language, naming, repeating,spontaneous speech     Fund of knowledge   CRANIAL NERVES: CN II: Visual fields are full to confrontation. Pupils are round equal and briskly reactive to light. CN III, IV, VI: Fatigable right ptosis, mild bilateral exophoria, moderate eye closure, cheek puff, mild jaw opening weakness, CN V: Facial sensation is intact to light touch CN VII: Face is symmetric with normal eye closure  CN VIII: Hearing is normal to causal conversation. CN IX, X: Phonation is normal. CN XI: Head turning and shoulder shrug are intact  MOTOR: Mild neck flexion weakness, moderate bilateral shoulder abduction, external rotation weakness, left side is also limited due to left shoulder pain, mild bilateral hip flexion weakness  REFLEXES: Reflexes are 2+ and symmetric at the biceps, triceps, knees, and ankles. Plantar responses are flexor.  SENSORY: Intact to light touch, pinprick and vibratory sensation are intact in fingers and toes.  COORDINATION: There is no trunk or limb dysmetria noted.  GAIT/STANCE: He could not get up from seated position arm crossed, needing pushing on chair arm, multiple attempts, mildly antalgic, cautious,  REVIEW OF SYSTEMS:  Full 14 system review of systems performed and notable only for as above All other review of systems were negative.   ALLERGIES: Allergies  Allergen Reactions   Sulfa Antibiotics Other (See Comments)    unknown    HOME MEDICATIONS: Current Outpatient Medications  Medication Sig Dispense Refill   acetaminophen (TYLENOL) 500 MG tablet Take 1,000 mg by mouth in the morning.     azaTHIOprine (IMURAN) 50 MG tablet Take 1 tablet in the morning and 2 in the evening 270 tablet 1   glimepiride (AMARYL) 4 MG tablet Take 4 mg by mouth 2 (two) times daily.     levothyroxine (SYNTHROID) 150 MCG tablet Take 150 mcg by mouth daily before breakfast.     losartan (COZAAR) 100 MG  tablet Take 100 mg by mouth daily.     omeprazole (PRILOSEC) 20 MG capsule Take 20 mg by mouth every evening.      ONE TOUCH ULTRA TEST test strip      ONETOUCH DELICA LANCETS 34H MISC      pyridostigmine (MESTINON) 60 MG tablet Take 0.5 tablets (30 mg total) by mouth 3 (three) times daily. 180 tablet 0   rosuvastatin (CRESTOR) 10 MG tablet Take 10 mg by mouth daily.     TOUJEO MAX SOLOSTAR 300 UNIT/ML SOPN Inject 18 Units into the skin at bedtime.     nitroGLYCERIN (NITROSTAT) 0.4 MG SL tablet Place 1 tablet (0.4 mg total) under the tongue every 5 (five) minutes as needed. 25 tablet 3   No current facility-administered medications for this visit.    PAST MEDICAL HISTORY: Past Medical History:  Diagnosis Date   Arthritis    Bladder neck contracture    Borderline hyperlipidemia    Claustrophobia    Complication of anesthesia    CLAUSTROPHOBIC   GERD (gastroesophageal reflux disease)    H/O hiatal hernia    History of prostate  cancer    S/P PROSTATECTOMY   History of thyroid nodule    LARGE GOITER   Hypertension    Hypothyroidism, postsurgical    MG (myasthenia gravis) ([place])    DX 2013 --  NEUROLOGIST--  DR WILLIS/  TREATED W/ MEDICATION UNTIL SYMPTOMS RESOLVED -- NO REMISSION--  SEES DR WILLIS PRN   OSA (obstructive sleep apnea)    NON-COMPLIANT CPAP--  CLAUSTROPHOBIC   Shortness of breath    ALWAYS--HAD FOR YEARS - STATES HE CAN'T DO HARDLY ANYTHING WITHOUT GIVING OUT   Type 2 diabetes mellitus ([facility])    Weak urinary stream     PAST SURGICAL HISTORY: Past Surgical History:  Procedure Laterality Date   CYSTOSCOPY N/A 07/30/2013   Procedure: CYSTOSCOPY WITH Anastasio Champion ;  Surgeon: Bernestine Amass, MD;  Location: [facility];  Service: Urology;  Laterality: N/A;   CYSTOSCOPY WITH URETHRAL DILATATION N/A 08/02/2012   Procedure: CYSTOSCOPY WITH URETHRAL DILATATION, AN REMOVAL STAPLE FROM BLADDER NECK;  Surgeon: Bernestine Amass, MD;  [facility];  Service: Urology;  Laterality: N/A;   HEMORRHOID SURGERY  1990's   INCISIONAL HERNIA REPAIR N/A 04/23/2013   Procedure: LAPAROSCOPIC INCISIONAL HERNIA;  Surgeon: [name], MD;  Location[facility];  Service: General;  Laterality: N/A;   INSERTION OF MESH N/A 04/23/2013   Procedure: INSERTION OF MESH;  Surgeon: [name], MD;  Location: [facility];  Service: General;  Laterality: N/A;   LEFT HEART CATH AND CORONARY ANGIOGRAPHY N/A 08/26/2020   Procedure: LEFT HEART CATH AND CORONARY ANGIOGRAPHY;  Surgeon: Jettie Booze, MD;  Location: [place] [facility];  Service: Cardiovascular;  Laterality: N/A;   ROBOT ASSISTED LAPAROSCOPIC RADICAL PROSTATECTOMY  12/15/2011   Procedure: ROBOTIC ASSISTED LAPAROSCOPIC RADICAL PROSTATECTOMY;  Surgeon: Bernestine Amass, MD;  Location[facility];  Service: Urology;  Laterality: N/A;   TOTAL THYROIDECTOMY  05-24-2003    FAMILY HISTORY: Family History  Problem Relation Age of Onset   Heart disease Mother    Glaucoma Mother    Hypertension Mother    Colon cancer Father    Diabetes Sister    Diabetes Brother    Diabetes Brother    Cancer Brother     SOCIAL HISTORY: Social History   Socioeconomic History   Marital status: Married    Spouse name: Doris   Number of children: 1   Years of education: 10th   Highest education level: Not on file  Occupational History   Occupation: semi-retired  Tobacco Use   Smoking status: Never   Smokeless tobacco: Never  Vaping Use   Vaping Use: Never used  Substance and Sexual Activity   Alcohol use: No   Drug use: No   Sexual activity: Not on file  Other Topics Concern   Not on file  Social History Narrative   Lives at home with his wife.   Right-handed.   2-3 cups caffeine per day.   Social Determinants of Health   Financial Resource Strain: Not on file  Food Insecurity: Not on file  Transportation Needs: Not on file  Physical Activity: Not on file  Stress: Not on file  Social  Connections: Not on file  Intimate Partner Violence: Not on file    Total time spent reviewing the chart, obtaining history, examined patient, ordering tests, documentation, consultations and family, care coordination was 56 minutes.    Marcial Pacas, M.D. Ph.D.  [facility] [address], [address]  [place], [place] [address] Ph: [phone] Fax: [phone]  CC:  Cyndi Bender, PA-C [place],  [place] [address]  Cyndi Bender, PA-C

## 2021-01-11 ENCOUNTER — Other Ambulatory Visit: Payer: Self-pay | Admitting: Neurology

## 2021-01-11 DIAGNOSIS — G7000 Myasthenia gravis without (acute) exacerbation: Secondary | ICD-10-CM

## 2021-01-12 ENCOUNTER — Telehealth: Payer: Self-pay | Admitting: Neurology

## 2021-01-12 DIAGNOSIS — G7000 Myasthenia gravis without (acute) exacerbation: Secondary | ICD-10-CM

## 2021-01-12 NOTE — Telephone Encounter (Signed)
Please call patient, laboratory evaluation confirmed myasthenia gravis, with significantly elevated acetylcholine binding antibody 54.7,  A1c was elevated 8.8, suboptimal control of his diabetes, I forward lab result to his primary care Cyndi Bender, PA-C   Please encourage him to work closely with his primary care for better control of his diabetes, also encouraged him to take higher dose of Imuran 50 mg 2 tablets twice a day

## 2021-01-13 MED ORDER — AZATHIOPRINE 50 MG PO TABS: 100.0000 mg | ORAL_TABLET | Freq: Two times a day (BID) | ORAL | 3 refills | Status: DC

## 2021-01-13 NOTE — Addendum Note (Signed)
Addended by: Noberto Retort C on: 01/13/2021 11:48 AM   Modules accepted: Orders

## 2021-01-13 NOTE — Telephone Encounter (Signed)
I spoke to the patient. He verbalized understanding of the lab findings. He will continue to work with PCP on A1C. Agreeable to take higher dose of Imuran.

## 2021-01-15 LAB — HGB A1C W/O EAG: Hgb A1c MFr Bld: 8.8 % — ABNORMAL HIGH (ref 4.8–5.6)

## 2021-01-15 LAB — ACETYLCHOLINE RECEPTOR AB, ALL
AChR Binding Ab, Serum: 54.7 nmol/L — ABNORMAL HIGH (ref 0.00–0.24)
Acetylchol Block Ab: 56 % — ABNORMAL HIGH (ref 0–25)

## 2021-02-08 NOTE — Progress Notes (Signed)
Cardiology Office Note:    Date:  02/09/2021   ID:  Mario Burns, DOB 28-Apr-1945, MRN [id number]  PCP:  Cyndi Bender, PA-C  Cardiologist:  None  Electrophysiologist:  None   Referring MD: Cyndi Bender, PA-C   Chief Complaint  Patient presents with   Coronary Artery Disease    History of Present Illness:    Mario Burns is a 76 y.o. male with a hx of hypertension, myasthenia gravis, hypothyroidism, T2DM who presents for follow-up.  He was referred by Cyndi Bender, PA for evaluation of shortness of breath, initially seen on 08/07/2020.  He reports that he has been having chest pain and shortness of breath.  Reports had 2-3 episodes of chest pain over the last 2 weeks.  Reports pain is in center of his chest, sometimes sharp but sometimes pressure.  Often wakes up with it at night.  He does not exercise.  Reports has had chest pain with exertion but also can occur at rest.  Does report he gets short of breath with minimal exertion.  Reports chest pain can last for 15 minutes or so, but has lasted up to 1 hour.  Reports lightheadedness intermittently, denies any syncope.  Does report intermittent lower extremity edema.  Reports rare palpitations, none recently.  Does not check BP at home.  No smoking history.  Family history includes mother had heart failure, died at 62.  His sister sees a cardiologist, he is unsure of the details.  Echocardiogram 08/14/2020 showed EF 50 to 13%, grade 1 diastolic dysfunction, normal RV function, no significant valvular disease, mild dilatation of ascending aorta measuring 41 mm.  Coronary CTA on 08/15/2020 showed calcium score 0, probable total occlusion of mid LAD, mild ascending aortic dilatation measuring 40 mm.  Also noted to have aortic sclerosis measuring 18 mm in T8 vertebral body.  Cath on 08/26/2020 showed chronic total occlusion of mid LAD with collaterals filling mid to distal LAD, 60% LCx, 50% proximal to mid Lcx.  Zio patch x 14 days on 10/09/20  showed 1 episode of NSVT 6 beats, 9 episodes of SVT longest lasting 12 beats.  Since last clinic visit, he reports that he has been doing okay.  He denies any chest pain.  Does report some dyspnea with exertion, particular if he is walking fast.  Reports occasional lightheadedness, denies any syncope.  Reports has had some lower extremity edema.  He continues to have palpitations, lasts for few seconds and resolves.  He has not been checking his BP at home.   Past Medical History:  Diagnosis Date   Arthritis    Bladder neck contracture    Borderline hyperlipidemia    Claustrophobia    Complication of anesthesia    CLAUSTROPHOBIC   GERD (gastroesophageal reflux disease)    H/O hiatal hernia    History of prostate cancer    S/P PROSTATECTOMY   History of thyroid nodule    LARGE GOITER   Hypertension    Hypothyroidism, postsurgical    MG (myasthenia gravis) ([place])    DX 2013 --  NEUROLOGIST--  DR WILLIS/  TREATED W/ MEDICATION UNTIL SYMPTOMS RESOLVED -- NO REMISSION--  SEES DR WILLIS PRN   OSA (obstructive sleep apnea)    NON-COMPLIANT CPAP--  CLAUSTROPHOBIC   Shortness of breath    ALWAYS--HAD FOR YEARS - STATES HE CAN'T DO HARDLY ANYTHING WITHOUT GIVING OUT   Type 2 diabetes mellitus (HCC)    Weak urinary stream     Past Surgical  History:  Procedure Laterality Date   CYSTOSCOPY N/A 07/30/2013   Procedure: CYSTOSCOPY WITH Anastasio Champion ;  Surgeon: Bernestine Amass, MD;  Location: [facility];  Service: Urology;  Laterality: N/A;   CYSTOSCOPY WITH URETHRAL DILATATION N/A 08/02/2012   Procedure: CYSTOSCOPY WITH URETHRAL DILATATION, AN REMOVAL STAPLE FROM BLADDER NECK;  Surgeon: Bernestine Amass, MD;  Location: [facility];  Service: Urology;  Laterality: N/A;   HEMORRHOID SURGERY  1990's   INCISIONAL HERNIA REPAIR N/A 04/23/2013   Procedure: LAPAROSCOPIC INCISIONAL HERNIA;  Surgeon: [name], MD;  Location: [facility];  Service: General;  Laterality:  N/A;   INSERTION OF MESH N/A 04/23/2013   Procedure: INSERTION OF MESH;  Surgeon: [name], MD;  Location: [facility];  Service: General;  Laterality: N/A;   LEFT HEART CATH AND CORONARY ANGIOGRAPHY N/A 08/26/2020   Procedure: LEFT HEART CATH AND CORONARY ANGIOGRAPHY;  Surgeon: Jettie Booze, MD;  Location: [facility] [place] [facility];  Service: Cardiovascular;  Laterality: N/A;   ROBOT ASSISTED LAPAROSCOPIC RADICAL PROSTATECTOMY  12/15/2011   Procedure: ROBOTIC ASSISTED LAPAROSCOPIC RADICAL PROSTATECTOMY;  Surgeon: Bernestine Amass, MD;  Location: [facility];  Service: Urology;  Laterality: N/A;   TOTAL THYROIDECTOMY  05-24-2003    Current Medications: Current Meds  Medication Sig   acetaminophen (TYLENOL) 500 MG tablet Take 1,000 mg by mouth in the morning.   aspirin EC 81 MG tablet Take 1 tablet (81 mg total) by mouth daily. Swallow whole.   azaTHIOprine (IMURAN) 50 MG tablet Take 2 tablets (100 mg total) by mouth in the morning and at bedtime.   glimepiride (AMARYL) 4 MG tablet Take 4 mg by mouth 2 (two) times daily.   levothyroxine (SYNTHROID) 150 MCG tablet Take 150 mcg by mouth daily before breakfast.   losartan (COZAAR) 100 MG tablet Take 100 mg by mouth daily.   omeprazole (PRILOSEC) 20 MG capsule Take 20 mg by mouth every evening.    ONE TOUCH ULTRA TEST test strip    ONETOUCH DELICA LANCETS 26S MISC    pyridostigmine (MESTINON) 60 MG tablet Take 0.5 tablets (30 mg total) by mouth 3 (three) times daily.   rosuvastatin (CRESTOR) 10 MG tablet Take 10 mg by mouth daily.   TOUJEO MAX SOLOSTAR 300 UNIT/ML SOPN Inject 18 Units into the skin at bedtime.     Allergies:   Sulfa antibiotics   Social History   Socioeconomic History   Marital status: Married    Spouse name: Doris   Number of children: 1   Years of education: 10th   Highest education level: Not on file  Occupational History   Occupation: semi-retired  Tobacco Use   Smoking status: Never   Smokeless tobacco: Never   Vaping Use   Vaping Use: Never used  Substance and Sexual Activity   Alcohol use: No   Drug use: No   Sexual activity: Not on file  Other Topics Concern   Not on file  Social History Narrative   Lives at home with his wife.   Right-handed.   2-3 cups caffeine per day.   Social Determinants of Health   Financial Resource Strain: Not on file  Food Insecurity: Not on file  Transportation Needs: Not on file  Physical Activity: Not on file  Stress: Not on file  Social Connections: Not on file     Family History: The patient's family history includes Cancer in his brother; Colon cancer in his father; Diabetes in  his brother, brother, and sister; Glaucoma in his mother; Heart disease in his mother; Hypertension in his mother.  ROS:   Please see the history of present illness.     All other systems reviewed and are negative.  EKGs/Labs/Other Studies Reviewed:    The following studies were reviewed today:   EKG:   02/09/21: Normal sinus rhythm, rate 61, no ST abnormality  Recent Labs: 09/03/2020: ALT 32; BUN 19; Creatinine 0.86; Hemoglobin 13.9; Platelet Count 249; Potassium 4.1; Sodium 137; TSH 0.747  Recent Lipid Panel No results found for: CHOL, TRIG, HDL, CHOLHDL, VLDL, LDLCALC, LDLDIRECT  Physical Exam:    VS:  BP 140/90 (BP Location: Left Arm, Patient Position: Sitting)    Pulse 61    Ht 5\' 9"  (1.753 m)    Wt 180 lb 12.8 oz (82 kg)    SpO2 98%    BMI 26.70 kg/m     Wt Readings from Last 3 Encounters:  02/09/21 180 lb 12.8 oz (82 kg)  01/08/21 176 lb (79.8 kg)  12/12/20 176 lb 12.8 oz (80.2 kg)     GEN:  Well nourished, well developed in no acute distress HEENT: Normal NECK: No JVD; No carotid bruits CARDIAC: RRR, no murmurs, rubs, gallops RESPIRATORY:  Clear to auscultation without rales, wheezing or rhonchi  ABDOMEN: Soft, non-tender, non-distended MUSCULOSKELETAL:  Trace edema; No deformity  SKIN: Warm and dry NEUROLOGIC:  Alert and oriented x  3 PSYCHIATRIC:  Normal affect   ASSESSMENT:    1. Coronary artery disease involving native coronary artery of native heart without angina pectoris   2. Essential hypertension   3. Palpitations   4. Hyperlipidemia, unspecified hyperlipidemia type   5. Aortic dilatation (HCC)     PLAN:    CAD: Reported atypical chest pain.  Echocardiogram 08/14/2020 showed EF 50 to 44%, grade 1 diastolic dysfunction, normal RV function, no significant valvular disease, mild dilatation of ascending aorta measuring 41 mm.  Coronary CTA on 08/15/2020 showed calcium score 0, probable total occlusion of mid LAD.  Cath on 08/26/2020 showed chronic total occlusion of mid LAD with collaterals filling mid to distal LAD, 60% LCx, 50% proximal to mid Lcx. -Started Imdur 30 mg daily, but did not tolerate due to headache.  Currently denies any chest pain -No beta-blocker given myasthenia gravis -As needed sublingual nitroglycerin, has not had to use -He stopped taking aspirin, recommend restarting aspirin 81 mg daily -Started crestor 10 mg daily, previously had been given referral to lipid clinic for PCSK9 inhibitor as unable to tolerate statins but is now tolerating Crestor.  He is having labs checked with PCP tomorrow, we will follow-up on results of lipid panel  Palpitations: Zio patch x 14 days on 10/09/20 showed 1 episode of NSVT 6 beats, 9 episodes of SVT longest lasting 12 beats.  No symptoms reported during episodes  Hypertension: Continue losartan 100 mg daily.  BP mildly elevated in clinic today, asked to check BP twice daily for next week and call us with results  Hyperlipidemia: LDL 122 on 08/04/2020.  Had previously been unable to tolerate statins but is tolerating rosuvastatin 10 mg daily.  Having lipid panel drawn with PCP tomorrow, we will follow-up results  T2DM: On insulin.  A1c 8.9% on 08/04/2020.  Recommend referral to endocrinology for diabetes management, but he declines at this time, wants to discuss  further with PCP  OSA: unable to tolerate CPAP  Spinal lesion: noted to have area of sclerosis measuring 18 mm in T8  vertebral body on coronary CTA 08/15/2020, does have history prostate cancer and concerning that this could represent metastatic disease.  Referred to oncology, PET scan was negative  Aortic dilatation: Mild ascending aortic dilatation measuring 40 mm on CTA 08/15/2020.  Plan repeat CTA chest in 1 year   RTC in 6 months   Medication Adjustments/Labs and Tests Ordered: Current medicines are reviewed at length with the patient today.  Concerns regarding medicines are outlined above.  Orders Placed This Encounter  Procedures   EKG 12-Lead      Meds ordered this encounter  Medications   aspirin EC 81 MG tablet    Sig: Take 1 tablet (81 mg total) by mouth daily. Swallow whole.    Dispense:  90 tablet    Refill:  3       Patient Instructions  Medication Instructions:  Start baby aspirin (81 mg) daily   *If you need a refill on your cardiac medications before your next appointment, please call your pharmacy*   Lab Work: Have your primary care send [place] blood work - fax number [phone])   Follow-Up: At [facility], you and your health needs are our priority.  As part of our continuing mission to provide you with exceptional heart care, we have created designated Provider Care Teams.  These Care Teams include your primary Cardiologist (physician) and Advanced Practice Providers (APPs -  Physician Assistants and Nurse Practitioners) who all work together to provide you with the care you need, when you need it.  We recommend signing up for the patient portal called "MyChart".  Sign up information is provided on this After Visit Summary.  MyChart is used to connect with patients for Virtual Visits (Telemedicine).  Patients are able to view lab/test results, encounter notes, upcoming appointments, etc.  Non-urgent messages can be sent to your provider as well.    To learn more about what you can do with MyChart, go to [email or web address].    Your next appointment:   6 month(s)  The format for your next appointment:   In Person  Provider:   Oswaldo Milian, MD    Other Instructions Take BP twice daily for one week, call us with those readings.      Signed, Donato Heinz, MD  02/09/2021 8:45 AM    [place]

## 2021-02-09 ENCOUNTER — Ambulatory Visit: Payer: PPO | Admitting: Cardiology

## 2021-02-09 ENCOUNTER — Other Ambulatory Visit: Payer: Self-pay

## 2021-02-09 VITALS — BP 140/90 | HR 61 | Ht 69.0 in | Wt 180.8 lb

## 2021-02-09 DIAGNOSIS — I77819 Aortic ectasia, unspecified site: Secondary | ICD-10-CM

## 2021-02-09 DIAGNOSIS — R002 Palpitations: Secondary | ICD-10-CM

## 2021-02-09 DIAGNOSIS — E785 Hyperlipidemia, unspecified: Secondary | ICD-10-CM | POA: Diagnosis not present

## 2021-02-09 DIAGNOSIS — I2510 Atherosclerotic heart disease of native coronary artery without angina pectoris: Secondary | ICD-10-CM | POA: Diagnosis not present

## 2021-02-09 DIAGNOSIS — I10 Essential (primary) hypertension: Secondary | ICD-10-CM | POA: Diagnosis not present

## 2021-02-09 MED ORDER — ASPIRIN EC 81 MG PO TBEC: 81.0000 mg | DELAYED_RELEASE_TABLET | Freq: Every day | ORAL | 3 refills | Status: DC

## 2021-02-09 NOTE — Patient Instructions (Signed)
Medication Instructions:  Start baby aspirin (81 mg) daily   *If you need a refill on your cardiac medications before your next appointment, please call your pharmacy*   Lab Work: Have your primary care send [place] blood work - fax number [phone])   Follow-Up: At [facility], you and your health needs are our priority.  As part of our continuing mission to provide you with exceptional heart care, we have created designated Provider Care Teams.  These Care Teams include your primary Cardiologist (physician) and Advanced Practice Providers (APPs -  Physician Assistants and Nurse Practitioners) who all work together to provide you with the care you need, when you need it.  We recommend signing up for the patient portal called "MyChart".  Sign up information is provided on this After Visit Summary.  MyChart is used to connect with patients for Virtual Visits (Telemedicine).  Patients are able to view lab/test results, encounter notes, upcoming appointments, etc.  Non-urgent messages can be sent to your provider as well.   To learn more about what you can do with MyChart, go to [email or web address].    Your next appointment:   6 month(s)  The format for your next appointment:   In Person  Provider:   Oswaldo Milian, MD    Other Instructions Take BP twice daily for one week, call us with those readings.

## 2021-02-10 DIAGNOSIS — I2510 Atherosclerotic heart disease of native coronary artery without angina pectoris: Secondary | ICD-10-CM | POA: Diagnosis not present

## 2021-02-10 DIAGNOSIS — E7800 Pure hypercholesterolemia, unspecified: Secondary | ICD-10-CM | POA: Diagnosis not present

## 2021-02-10 DIAGNOSIS — E890 Postprocedural hypothyroidism: Secondary | ICD-10-CM | POA: Diagnosis not present

## 2021-02-10 DIAGNOSIS — M1711 Unilateral primary osteoarthritis, right knee: Secondary | ICD-10-CM | POA: Diagnosis not present

## 2021-02-10 DIAGNOSIS — Z6827 Body mass index (BMI) 27.0-27.9, adult: Secondary | ICD-10-CM | POA: Diagnosis not present

## 2021-02-10 DIAGNOSIS — G7000 Myasthenia gravis without (acute) exacerbation: Secondary | ICD-10-CM | POA: Diagnosis not present

## 2021-02-10 DIAGNOSIS — I700 Atherosclerosis of aorta: Secondary | ICD-10-CM | POA: Diagnosis not present

## 2021-02-10 DIAGNOSIS — I10 Essential (primary) hypertension: Secondary | ICD-10-CM | POA: Diagnosis not present

## 2021-02-10 DIAGNOSIS — E1165 Type 2 diabetes mellitus with hyperglycemia: Secondary | ICD-10-CM | POA: Diagnosis not present

## 2021-03-06 DIAGNOSIS — I10 Essential (primary) hypertension: Secondary | ICD-10-CM | POA: Diagnosis not present

## 2021-03-06 DIAGNOSIS — Z6827 Body mass index (BMI) 27.0-27.9, adult: Secondary | ICD-10-CM | POA: Diagnosis not present

## 2021-03-06 DIAGNOSIS — E1149 Type 2 diabetes mellitus with other diabetic neurological complication: Secondary | ICD-10-CM | POA: Diagnosis not present

## 2021-03-18 DIAGNOSIS — I2510 Atherosclerotic heart disease of native coronary artery without angina pectoris: Secondary | ICD-10-CM | POA: Diagnosis not present

## 2021-03-18 DIAGNOSIS — R109 Unspecified abdominal pain: Secondary | ICD-10-CM | POA: Diagnosis not present

## 2021-03-18 DIAGNOSIS — K5900 Constipation, unspecified: Secondary | ICD-10-CM | POA: Diagnosis not present

## 2021-03-18 DIAGNOSIS — Z86010 Personal history of colonic polyps: Secondary | ICD-10-CM | POA: Diagnosis not present

## 2021-03-18 DIAGNOSIS — K625 Hemorrhage of anus and rectum: Secondary | ICD-10-CM | POA: Diagnosis not present

## 2021-03-18 DIAGNOSIS — K649 Unspecified hemorrhoids: Secondary | ICD-10-CM | POA: Diagnosis not present

## 2021-03-26 DIAGNOSIS — K5140 Inflammatory polyps of colon without complications: Secondary | ICD-10-CM | POA: Diagnosis not present

## 2021-03-26 DIAGNOSIS — K921 Melena: Secondary | ICD-10-CM | POA: Diagnosis not present

## 2021-03-26 DIAGNOSIS — K5730 Diverticulosis of large intestine without perforation or abscess without bleeding: Secondary | ICD-10-CM | POA: Diagnosis not present

## 2021-03-26 DIAGNOSIS — K649 Unspecified hemorrhoids: Secondary | ICD-10-CM | POA: Diagnosis not present

## 2021-03-26 DIAGNOSIS — Z86010 Personal history of colonic polyps: Secondary | ICD-10-CM | POA: Diagnosis not present

## 2021-03-31 DIAGNOSIS — K5140 Inflammatory polyps of colon without complications: Secondary | ICD-10-CM | POA: Diagnosis not present

## 2021-04-27 DIAGNOSIS — I10 Essential (primary) hypertension: Secondary | ICD-10-CM | POA: Diagnosis not present

## 2021-04-27 DIAGNOSIS — G7000 Myasthenia gravis without (acute) exacerbation: Secondary | ICD-10-CM | POA: Diagnosis not present

## 2021-04-27 DIAGNOSIS — E7800 Pure hypercholesterolemia, unspecified: Secondary | ICD-10-CM | POA: Diagnosis not present

## 2021-04-27 DIAGNOSIS — I700 Atherosclerosis of aorta: Secondary | ICD-10-CM | POA: Diagnosis not present

## 2021-04-27 DIAGNOSIS — E1149 Type 2 diabetes mellitus with other diabetic neurological complication: Secondary | ICD-10-CM | POA: Diagnosis not present

## 2021-04-27 DIAGNOSIS — G4733 Obstructive sleep apnea (adult) (pediatric): Secondary | ICD-10-CM | POA: Diagnosis not present

## 2021-04-27 DIAGNOSIS — E890 Postprocedural hypothyroidism: Secondary | ICD-10-CM | POA: Diagnosis not present

## 2021-04-27 DIAGNOSIS — I2510 Atherosclerotic heart disease of native coronary artery without angina pectoris: Secondary | ICD-10-CM | POA: Diagnosis not present

## 2021-04-27 DIAGNOSIS — M1711 Unilateral primary osteoarthritis, right knee: Secondary | ICD-10-CM | POA: Diagnosis not present

## 2021-05-04 ENCOUNTER — Telehealth: Payer: Self-pay | Admitting: Neurology

## 2021-05-04 DIAGNOSIS — G7000 Myasthenia gravis without (acute) exacerbation: Secondary | ICD-10-CM

## 2021-05-04 MED ORDER — PYRIDOSTIGMINE BROMIDE 60 MG PO TABS: 30.0000 mg | ORAL_TABLET | Freq: Three times a day (TID) | ORAL | 1 refills | Status: DC

## 2021-05-04 NOTE — Telephone Encounter (Signed)
Pt's wife, Criss Pallone (on [place]) called refill for pyridostigmine (MESTINON) 60 MG tablet. Called the pharmacy for a refill. Pharmacy said can only refilled for 30-day supply which cost $15. Would like a call from the nurse to discuss refill 90-day supply. ?

## 2021-05-04 NOTE — Telephone Encounter (Signed)
I spoke to the patient. She confirmed he is taking Mestinon '60mg'$ , 0.5 TID. She needs 90-day refill sent to pharmacy. Rx transmitted.  ?

## 2021-05-11 ENCOUNTER — Ambulatory Visit: Payer: PPO | Admitting: Neurology

## 2021-06-08 ENCOUNTER — Encounter: Payer: Self-pay | Admitting: Neurology

## 2021-06-08 ENCOUNTER — Ambulatory Visit: Payer: PPO | Admitting: Neurology

## 2021-06-08 VITALS — BP 142/78 | HR 60 | Ht 69.0 in | Wt 175.5 lb

## 2021-06-08 DIAGNOSIS — G7000 Myasthenia gravis without (acute) exacerbation: Secondary | ICD-10-CM | POA: Diagnosis not present

## 2021-06-08 DIAGNOSIS — R531 Weakness: Secondary | ICD-10-CM

## 2021-06-08 HISTORY — DX: Weakness: R53.1

## 2021-06-08 MED ORDER — AZATHIOPRINE 50 MG PO TABS: 100.0000 mg | ORAL_TABLET | Freq: Every day | ORAL | 11 refills | Status: DC

## 2021-06-08 NOTE — Progress Notes (Signed)
Chief Complaint  Patient presents with   Follow-up    Rm 14. Accompanied by wife, Tamela Oddi. Pt doing well, denies any new concerns.    ASSESSMENT AND PLAN  Mario Burns is a 76 y.o. male   Long history of myasthenia gravis  Moderate bulbar weakness, mild to moderate limb muscle weakness on examination,  He complains of intermittent chewing swallowing difficulty, dysarthria,  He would benefit better control of his myasthenia gravis symptoms, but very hesitant with medications, remains on low-dose Imuran 50 mg instead of the intended 100 mg twice a day, new prescription was called in, May take Mestinon 60 mg half tablets 3 times a day, he could not tolerate 60 mg due to GI side effect, Return to clinic with nurse practitioner in 6 months Suboptimal control of diabetes, A1c was 8.8,  Encouraged him to become more active, work closely with his primary care to have better control of his diabetes  DIAGNOSTIC DATA (LABS, IMAGING, TESTING) - I reviewed patient records, labs, notes, testing and imaging myself where available.   MEDICAL HISTORY:  Mario Burns is a 76 year old male, accompanied by his wife to follow-up seropositive generalized myasthenia gravis, he is accompanied by his wife at today's visit on January 08, 2021, his primary care is PA Cyndi Bender  I reviewed and summarized the referring note. PMHX HTN DM HLD CAD Thyroidectomy, on supplement History of prostate cancer, s/p proctectomy.  He was diagnosed with myasthenia gravis in 2008, presented with severe dysarthria, chewing swallowing difficulty, at is worse, he take more than 1 hour to finish a hamburger, he has been managed by Dr. Jannifer Franklin over the years, was treated with prednisone, Mestinon, and Imuran, for couple years, then his symptoms has improved so much, he stopped medication and  follow-up.  In summer of 2019, he began to experience weakness in his legs, problems chewing, swallowing, fatigue during the  male, he was started on prednisone 20 mg daily, Mestinon 30 mg 3 times a day, CellCept 500 mg twice daily in October 2019, he only took 1 CellCept complains of GI side effect, stopped taking medications, could not tolerate more than 30 mg twice a day of Mestinon due to diarrhea,  His symptoms again improved with treatment, had a second round of steroid, Decadron 2 mg daily at the end of 2019, was placed on Imuran 50 mg twice a day, also investigate the possibility of IVIG treatment, because of the high co-pay, he could not afford it  Over the last couple years, he continues to deal with variable degree of chewing difficulty, swallowing difficulty, mild slurred speech, fatigue, gait abnormality, his multiple joints pain also contributed  He is currently taking Imuran 50 mg twice a day, previous attempt of 50/100, he complains of GI side effect with 100 mg, Mestinon 30 mg twice a day, He was also recently diagnosed with coronary artery disease, he complains of difficulty swallowing, especially tough meat, doing well with mashed potato, even sandwiches, egg cause difficulty sometimes, he was diagnosed with obstructive sleep apnea more than 20 years ago, could not tolerate CPAP machine, has been sleeping in his recliner seems, also complains of frequent urination due to his prostate issues  CT chest in 2013, showed no evidence of thymus pathology, 5 cm hiatal hernia, previous total thyroidectomy  UPDATE June 08 2021: He continued on Imuran 50 mg twice a day instead of the intended 100 mg twice a day, worry about the GI side effect, stayed on Mestinon  60 mg half tablet twice a day,  He continues to have intermittent chewing and swallowing difficulties, has to chew food many times before attempts swallowing, generalized weakness, much less active  Laboratory evaluation showed suboptimal control of diabetes, A1c 8.8, previously 10.3, acetylcholine binding antibody 54.7, blocking antibody 56   PHYSICAL  EXAM:   Vitals:   06/08/21 0840  BP: (!) 142/78  Pulse: 60  Weight: 175 lb 8 oz (79.6 kg)  Height: '5\' 9"'$  (1.753 m)   Not recorded     Body mass index is 25.92 kg/m.  PHYSICAL EXAMNIATION:  Gen: NAD, conversant, well nourised, well groomed                     Cardiovascular: Regular rate rhythm, no peripheral edema, warm, nontender. Eyes: Conjunctivae clear without exudates or hemorrhage Neck: Supple, no carotid bruits. Pulmonary: Clear to auscultation bilaterally   NEUROLOGICAL EXAM:  MENTAL STATUS: Speech/cognition: Mildly slurred thick speech CRANIAL NERVES: CN II: Visual fields are full to confrontation. Pupils are round equal and briskly reactive to light. CN III, IV, VI: No ptosis, extraocular movement were normal CN V: Facial sensation is intact to light touch CN VII: Moderate eye closure, cheek puff weakness, CN VIII: Hearing is normal to causal conversation. CN IX, X: Phonation is normal. CN XI: Head turning and shoulder shrug are intact  MOTOR: Mild neck flexion weakness, mild to moderate moderate bilateral shoulder abduction, external rotation weakness, left side is also limited due to left shoulder pain, mild bilateral hip flexion weakness  REFLEXES: Reflexes are 1  and symmetric at the biceps, triceps, knees, and ankles. Plantar responses are flexor.  SENSORY: Intact to light touch,    COORDINATION: There is no trunk or limb dysmetria noted.  GAIT/STANCE: He could not get up from seated position arm crossed, needing pushing on chair arm, multiple attempts, mildly antalgic, cautious,  REVIEW OF SYSTEMS:  Full 14 system review of systems performed and notable only for as above All other review of systems were negative.   ALLERGIES: Allergies  Allergen Reactions   Sulfa Antibiotics Other (See Comments)    unknown    HOME MEDICATIONS: Current Outpatient Medications  Medication Sig Dispense Refill   acetaminophen (TYLENOL) 500 MG tablet Take  1,000 mg by mouth in the morning.     aspirin EC 81 MG tablet Take 1 tablet (81 mg total) by mouth daily. Swallow whole. 90 tablet 3   azaTHIOprine (IMURAN) 50 MG tablet Take 2 tablets (100 mg total) by mouth in the morning and at bedtime. 360 tablet 3   glimepiride (AMARYL) 4 MG tablet Take 4 mg by mouth 2 (two) times daily.     levothyroxine (SYNTHROID) 150 MCG tablet Take 150 mcg by mouth daily before breakfast.     losartan (COZAAR) 100 MG tablet Take 100 mg by mouth daily.     omeprazole (PRILOSEC) 20 MG capsule Take 20 mg by mouth every evening.      ONE TOUCH ULTRA TEST test strip      ONETOUCH DELICA LANCETS 40J MISC      pyridostigmine (MESTINON) 60 MG tablet Take 0.5 tablets (30 mg total) by mouth 3 (three) times daily. 135 tablet 1   rosuvastatin (CRESTOR) 10 MG tablet Take 10 mg by mouth daily.     TOUJEO MAX SOLOSTAR 300 UNIT/ML SOPN Inject 18 Units into the skin at bedtime.     nitroGLYCERIN (NITROSTAT) 0.4 MG SL tablet Place 1 tablet (0.4 mg  total) under the tongue every 5 (five) minutes as needed. 25 tablet 3   No current facility-administered medications for this visit.    PAST MEDICAL HISTORY: Past Medical History:  Diagnosis Date   Arthritis    Bladder neck contracture    Borderline hyperlipidemia    Claustrophobia    Complication of anesthesia    CLAUSTROPHOBIC   GERD (gastroesophageal reflux disease)    H/O hiatal hernia    History of prostate cancer    S/P PROSTATECTOMY   History of thyroid nodule    LARGE GOITER   Hypertension    Hypothyroidism, postsurgical    MG (myasthenia gravis) ([place])    DX 2013 --  NEUROLOGIST--  DR WILLIS/  TREATED W/ MEDICATION UNTIL SYMPTOMS RESOLVED -- NO REMISSION--  SEES DR WILLIS PRN   OSA (obstructive sleep apnea)    NON-COMPLIANT CPAP--  CLAUSTROPHOBIC   Shortness of breath    ALWAYS--HAD FOR YEARS - STATES HE CAN'T DO HARDLY ANYTHING WITHOUT GIVING OUT   Type 2 diabetes mellitus (HCC)    Weak urinary stream     PAST  SURGICAL HISTORY: Past Surgical History:  Procedure Laterality Date   CYSTOSCOPY N/A 07/30/2013   Procedure: CYSTOSCOPY WITH Anastasio Champion ;  Surgeon: Bernestine Amass, MD;  Location[facility];  Service: Urology;  Laterality: N/A;   CYSTOSCOPY WITH URETHRAL DILATATION N/A 08/02/2012   Procedure: CYSTOSCOPY WITH URETHRAL DILATATION, AN REMOVAL STAPLE FROM BLADDER NECK;  Surgeon: Bernestine Amass, MD;  Location: [facility];  Service: Urology;  Laterality: N/A;   HEMORRHOID SURGERY  1990's   INCISIONAL HERNIA REPAIR N/A 04/23/2013   Procedure: LAPAROSCOPIC INCISIONAL HERNIA;  Surgeon: [name], MD;  Location: [facility];  Service: General;  Laterality: N/A;   INSERTION OF MESH N/A 04/23/2013   Procedure: INSERTION OF MESH;  Surgeon: [name], MD;  Location: [facility];  Service: General;  Laterality: N/A;   LEFT HEART CATH AND CORONARY ANGIOGRAPHY N/A 08/26/2020   Procedure: LEFT HEART CATH AND CORONARY ANGIOGRAPHY;  Surgeon: Jettie Booze, MD;  Location: [facility];  Service: Cardiovascular;  Laterality: N/A;   ROBOT ASSISTED LAPAROSCOPIC RADICAL PROSTATECTOMY  12/15/2011   Procedure: ROBOTIC ASSISTED LAPAROSCOPIC RADICAL PROSTATECTOMY;  Surgeon: Bernestine Amass, MD;  Location: [facility];  Service: Urology;  Laterality: N/A;   TOTAL THYROIDECTOMY  05-24-2003    FAMILY HISTORY: Family History  Problem Relation Age of Onset   Heart disease Mother    Glaucoma Mother    Hypertension Mother    Colon cancer Father    Diabetes Sister    Diabetes Brother    Diabetes Brother    Cancer Brother     SOCIAL HISTORY: Social History   Socioeconomic History   Marital status: Married    Spouse name: Doris   Number of children: 1   Years of education: 10th   Highest education level: Not on file  Occupational History   Occupation: semi-retired  Tobacco Use   Smoking status: Never   Smokeless tobacco: Never  Vaping Use   Vaping Use: Never used   Substance and Sexual Activity   Alcohol use: No   Drug use: No   Sexual activity: Not on file  Other Topics Concern   Not on file  Social History Narrative   Lives at home with his wife.   Right-handed.   2-3 cups caffeine per day.   Social Determinants of Radio broadcast assistant  Strain: Not on file  Food Insecurity: Not on file  Transportation Needs: Not on file  Physical Activity: Not on file  Stress: Not on file  Social Connections: Not on file  Intimate Partner Violence: Not on file    Total time spent reviewing the chart, obtaining history, examined patient, ordering tests, documentation, consultations and family, care coordination was 56 minutes.    Marcial Pacas, M.D. Ph.D.  [facility] [address] [place], [place] [address] Ph: [phone] Fax: [phone]  CC:  Cyndi Bender, PA-C [address] [place],  [place] [address]  Cyndi Bender, PA-C

## 2021-08-06 DIAGNOSIS — G7000 Myasthenia gravis without (acute) exacerbation: Secondary | ICD-10-CM | POA: Diagnosis not present

## 2021-08-06 DIAGNOSIS — M1711 Unilateral primary osteoarthritis, right knee: Secondary | ICD-10-CM | POA: Diagnosis not present

## 2021-08-06 DIAGNOSIS — I700 Atherosclerosis of aorta: Secondary | ICD-10-CM | POA: Diagnosis not present

## 2021-08-06 DIAGNOSIS — E1149 Type 2 diabetes mellitus with other diabetic neurological complication: Secondary | ICD-10-CM | POA: Diagnosis not present

## 2021-08-06 DIAGNOSIS — R531 Weakness: Secondary | ICD-10-CM | POA: Diagnosis not present

## 2021-08-06 DIAGNOSIS — E890 Postprocedural hypothyroidism: Secondary | ICD-10-CM | POA: Diagnosis not present

## 2021-08-06 DIAGNOSIS — I10 Essential (primary) hypertension: Secondary | ICD-10-CM | POA: Diagnosis not present

## 2021-08-06 DIAGNOSIS — I2510 Atherosclerotic heart disease of native coronary artery without angina pectoris: Secondary | ICD-10-CM | POA: Diagnosis not present

## 2021-08-06 DIAGNOSIS — E7800 Pure hypercholesterolemia, unspecified: Secondary | ICD-10-CM | POA: Diagnosis not present

## 2021-11-07 ENCOUNTER — Other Ambulatory Visit: Payer: Self-pay | Admitting: Cardiology

## 2021-11-10 DIAGNOSIS — M1711 Unilateral primary osteoarthritis, right knee: Secondary | ICD-10-CM | POA: Diagnosis not present

## 2021-11-10 DIAGNOSIS — I2510 Atherosclerotic heart disease of native coronary artery without angina pectoris: Secondary | ICD-10-CM | POA: Diagnosis not present

## 2021-11-10 DIAGNOSIS — Z1331 Encounter for screening for depression: Secondary | ICD-10-CM | POA: Diagnosis not present

## 2021-11-10 DIAGNOSIS — G7000 Myasthenia gravis without (acute) exacerbation: Secondary | ICD-10-CM | POA: Diagnosis not present

## 2021-11-10 DIAGNOSIS — I10 Essential (primary) hypertension: Secondary | ICD-10-CM | POA: Diagnosis not present

## 2021-11-10 DIAGNOSIS — Z139 Encounter for screening, unspecified: Secondary | ICD-10-CM | POA: Diagnosis not present

## 2021-11-10 DIAGNOSIS — E1149 Type 2 diabetes mellitus with other diabetic neurological complication: Secondary | ICD-10-CM | POA: Diagnosis not present

## 2021-11-10 DIAGNOSIS — E7800 Pure hypercholesterolemia, unspecified: Secondary | ICD-10-CM | POA: Diagnosis not present

## 2021-11-10 DIAGNOSIS — Z9181 History of falling: Secondary | ICD-10-CM | POA: Diagnosis not present

## 2021-11-10 DIAGNOSIS — E890 Postprocedural hypothyroidism: Secondary | ICD-10-CM | POA: Diagnosis not present

## 2021-11-10 DIAGNOSIS — I700 Atherosclerosis of aorta: Secondary | ICD-10-CM | POA: Diagnosis not present

## 2021-12-15 ENCOUNTER — Ambulatory Visit: Payer: PPO | Admitting: Neurology

## 2021-12-15 ENCOUNTER — Encounter: Payer: Self-pay | Admitting: Neurology

## 2021-12-15 VITALS — BP 158/77 | HR 57 | Ht 69.0 in | Wt 186.0 lb

## 2021-12-15 DIAGNOSIS — G7000 Myasthenia gravis without (acute) exacerbation: Secondary | ICD-10-CM | POA: Diagnosis not present

## 2021-12-15 MED ORDER — PYRIDOSTIGMINE BROMIDE 60 MG PO TABS: 30.0000 mg | ORAL_TABLET | Freq: Three times a day (TID) | ORAL | 3 refills | Status: DC

## 2021-12-15 NOTE — Progress Notes (Signed)
Patient: Mario Burns Date of Birth: 1945/04/09  Reason for Visit: Follow up History from: Patient, wife Primary Neurologist: Krista Blue  ASSESSMENT AND PLAN 76 y.o. year old male   1.  Myasthenia gravis -Overall stable, continues with mild to moderate bulbar weakness, wishes to remain on lower dose Imuran, Mestinon due to reported side effects -Continue Imuran 50 mg twice daily, Mestinon 30 mg twice daily -Check routine labs -Continue working on strict control of diabetes, last A1c reported 8.2 -Monitor for worsening symptoms, follow-up.  6 months  HISTORY  Mario Burns is a 76 year old male, accompanied by his wife to follow-up seropositive generalized myasthenia gravis, he is accompanied by his wife at today's visit on January 08, 2021, his primary care is PA Cyndi Bender   I reviewed and summarized the referring note. PMHX HTN DM HLD CAD Thyroidectomy, on supplement History of prostate cancer, s/p proctectomy.   He was diagnosed with myasthenia gravis in 2008, presented with severe dysarthria, chewing swallowing difficulty, at is worse, he take more than 1 hour to finish a hamburger, he has been managed by Dr. Jannifer Franklin over the years, was treated with prednisone, Mestinon, and Imuran, for couple years, then his symptoms has improved so much, he stopped medication and  follow-up.  In summer of 2019, he began to experience weakness in his legs, problems chewing, swallowing, fatigue during the male, he was started on prednisone 20 mg daily, Mestinon 30 mg 3 times a day, CellCept 500 mg twice daily in October 2019, he only took 1 CellCept complains of GI side effect, stopped taking medications, could not tolerate more than 30 mg twice a day of Mestinon due to diarrhea,  His symptoms again improved with treatment, had a second round of steroid, Decadron 2 mg daily at the end of 2019, was placed on Imuran 50 mg twice a day, also investigate the possibility of IVIG treatment, because of  the high co-pay, he could not afford it  Over the last couple years, he continues to deal with variable degree of chewing difficulty, swallowing difficulty, mild slurred speech, fatigue, gait abnormality, his multiple joints pain also contributed  He is currently taking Imuran 50 mg twice a day, previous attempt of 50/100, he complains of GI side effect with 100 mg, Mestinon 30 mg twice a day, He was also recently diagnosed with coronary artery disease, he complains of difficulty swallowing, especially tough meat, doing well with mashed potato, even sandwiches, egg cause difficulty sometimes, he was diagnosed with obstructive sleep apnea more than 20 years ago, could not tolerate CPAP machine, has been sleeping in his recliner seems, also complains of frequent urination due to his prostate issues   CT chest in 2013, showed no evidence of thymus pathology, 5 cm hiatal hernia, previous total thyroidectomy   UPDATE June 08 2021: He continued on Imuran 50 mg twice a day instead of the intended 100 mg twice a day, worry about the GI side effect, stayed on Mestinon 60 mg half tablet twice a day,  He continues to have intermittent chewing and swallowing difficulties, has to chew food many times before attempts swallowing, generalized weakness, much less active  Laboratory evaluation showed suboptimal control of diabetes, A1c 8.8, previously 10.3, acetylcholine binding antibody 54.7, blocking antibody 56   Update December 15, 2021 SS: MG is about the same, continues with intermittent chewing trouble, has to over chew solid foods, takes his time. No choking. No significant muscle weakness other than aging. No falls. Last  A1C was 8.2, trying to do better with diet. No new problems or concerns.  Remains on Imuran 50 mg twice a day, Mestinon 30 mg twice daily. Here with his wife.   REVIEW OF SYSTEMS: Out of a complete 14 system review of symptoms, the patient complains only of the following symptoms, and all  other reviewed systems are negative.  See HPI  ALLERGIES: Allergies  Allergen Reactions   Sulfa Antibiotics Other (See Comments)    unknown    HOME MEDICATIONS: Outpatient Medications Prior to Visit  Medication Sig Dispense Refill   acetaminophen (TYLENOL) 500 MG tablet Take 1,000 mg by mouth in the morning.     aspirin EC 81 MG tablet Take 1 tablet (81 mg total) by mouth daily. Swallow whole. 90 tablet 3   azaTHIOprine (IMURAN) 50 MG tablet Take 2 tablets (100 mg total) by mouth daily. 120 tablet 11   glimepiride (AMARYL) 4 MG tablet Take 4 mg by mouth 2 (two) times daily.     levothyroxine (SYNTHROID) 150 MCG tablet Take 150 mcg by mouth daily before breakfast.     losartan (COZAAR) 100 MG tablet Take 100 mg by mouth daily.     omeprazole (PRILOSEC) 20 MG capsule Take 20 mg by mouth every evening.      ONE TOUCH ULTRA TEST test strip      ONETOUCH DELICA LANCETS 41L MISC      rosuvastatin (CRESTOR) 10 MG tablet Take 1 tablet by mouth once daily 90 tablet 3   TOUJEO MAX SOLOSTAR 300 UNIT/ML SOPN Inject 18 Units into the skin at bedtime.     pyridostigmine (MESTINON) 60 MG tablet Take 0.5 tablets (30 mg total) by mouth 3 (three) times daily. 135 tablet 1   nitroGLYCERIN (NITROSTAT) 0.4 MG SL tablet Place 1 tablet (0.4 mg total) under the tongue every 5 (five) minutes as needed. 25 tablet 3   No facility-administered medications prior to visit.    PAST MEDICAL HISTORY: Past Medical History:  Diagnosis Date   Arthritis    Bladder neck contracture    Borderline hyperlipidemia    Claustrophobia    Complication of anesthesia    CLAUSTROPHOBIC   GERD (gastroesophageal reflux disease)    H/O hiatal hernia    History of prostate cancer    S/P PROSTATECTOMY   History of thyroid nodule    LARGE GOITER   Hypertension    Hypothyroidism, postsurgical    MG (myasthenia gravis) ([place])    DX 2013 --  NEUROLOGIST--  DR WILLIS/  TREATED W/ MEDICATION UNTIL SYMPTOMS RESOLVED -- NO  REMISSION--  SEES DR WILLIS PRN   OSA (obstructive sleep apnea)    NON-COMPLIANT CPAP--  CLAUSTROPHOBIC   Shortness of breath    ALWAYS--HAD FOR YEARS - STATES HE CAN'T DO HARDLY ANYTHING WITHOUT GIVING OUT   Type 2 diabetes mellitus (HCC)    Weak urinary stream     PAST SURGICAL HISTORY: Past Surgical History:  Procedure Laterality Date   CYSTOSCOPY N/A 07/30/2013   Procedure: CYSTOSCOPY WITH Anastasio Champion ;  Surgeon: Bernestine Amass, MD;  Location: [facility];  Service: Urology;  Laterality: N/A;   CYSTOSCOPY WITH URETHRAL DILATATION N/A 08/02/2012   Procedure: CYSTOSCOPY WITH URETHRAL DILATATION, AN REMOVAL STAPLE FROM BLADDER NECK;  Surgeon: Bernestine Amass, MD;  [facility];  Service: Urology;  Laterality: N/A;   HEMORRHOID SURGERY  1990's   INCISIONAL HERNIA REPAIR N/A 04/23/2013   Procedure: LAPAROSCOPIC INCISIONAL HERNIA;  Surgeon: [name], MD;  Location: [facility];  Service: General;  Laterality: N/A;   INSERTION OF MESH N/A 04/23/2013   Procedure: INSERTION OF MESH;  Surgeon: [name], MD;  Location: [facility];  Service: General;  Laterality: N/A;   LEFT HEART CATH AND CORONARY ANGIOGRAPHY N/A 08/26/2020   Procedure: LEFT HEART CATH AND CORONARY ANGIOGRAPHY;  Surgeon: Jettie Booze, MD;  Location: [facility] [facility];  Service: Cardiovascular;  Laterality: N/A;   ROBOT ASSISTED LAPAROSCOPIC RADICAL PROSTATECTOMY  12/15/2011   Procedure: ROBOTIC ASSISTED LAPAROSCOPIC RADICAL PROSTATECTOMY;  Surgeon: Bernestine Amass, MD;  Location: [facility];  Service: Urology;  Laterality: N/A;   TOTAL THYROIDECTOMY  05-24-2003    FAMILY HISTORY: Family History  Problem Relation Age of Onset   Heart disease Mother    Glaucoma Mother    Hypertension Mother    Colon cancer Father    Diabetes Sister    Diabetes Brother    Diabetes Brother    Cancer Brother     SOCIAL HISTORY: Social History   Socioeconomic History   Marital status:  Married    Spouse name: Doris   Number of children: 1   Years of education: 10th   Highest education level: Not on file  Occupational History   Occupation: semi-retired  Tobacco Use   Smoking status: Never   Smokeless tobacco: Never  Vaping Use   Vaping Use: Never used  Substance and Sexual Activity   Alcohol use: No   Drug use: No   Sexual activity: Not on file  Other Topics Concern   Not on file  Social History Narrative   Lives at home with his wife.   Right-handed.   2-3 cups caffeine per day.   Social Determinants of Health   Financial Resource Strain: Not on file  Food Insecurity: Not on file  Transportation Needs: Not on file  Physical Activity: Not on file  Stress: Not on file  Social Connections: Not on file  Intimate Partner Violence: Not on file    PHYSICAL EXAM  Vitals:   12/15/21 0914  BP: (!) 158/77  Pulse: (!) 57  Weight: 186 lb (84.4 kg)  Height: '5\' 9"'$  (1.753 m)   Body mass index is 27.47 kg/m.  Generalized: Well developed, in no acute distress  Neurological examination  Mentation: Alert oriented to time, place, history taking. Follows all commands speech and language fluent Cranial nerve II-XII: Pupils were equal round reactive to light. Extraocular movements were full, visual field were full on confrontational test. Facial sensation and strength were normal.  Head turning and shoulder shrug  were normal and symmetric.  Mild-moderate cheek puff and eye open weakness noted. Motor:  4/5 left arm due to shoulder weakness, 4/5 bilateral hip flexion weakness Sensory: Sensory testing is intact to soft touch on all 4 extremities. No evidence of extinction is noted.  Coordination: Cerebellar testing reveals good finger-nose-finger and heel-to-shin bilaterally.  Gait and station: Not able to push off from seated position with arms crossed, gait is slightly wide-based, but steady and independent Reflexes: Deep tendon reflexes are symmetric and normal  bilaterally.   DIAGNOSTIC DATA (LABS, IMAGING, TESTING) - I reviewed patient records, labs, notes, testing and imaging myself where available.  Lab Results  Component Value Date   WBC 6.2 09/03/2020   HGB 13.9 09/03/2020   HCT 40.9 09/03/2020   MCV 91.5 09/03/2020   PLT 249 09/03/2020      Component Value Date/Time  NA 137 09/03/2020 1426   NA 134 08/21/2020 1052   K 4.1 09/03/2020 1426   CL 105 09/03/2020 1426   CO2 23 09/03/2020 1426   GLUCOSE 135 (H) 09/03/2020 1426   BUN 19 09/03/2020 1426   BUN 15 08/21/2020 1052   CREATININE 0.86 09/03/2020 1426   CALCIUM 9.7 09/03/2020 1426   PROT 7.2 09/03/2020 1426   PROT 6.2 05/26/2020 0940   ALBUMIN 3.9 09/03/2020 1426   ALBUMIN 3.9 05/26/2020 0940   AST 23 09/03/2020 1426   ALT 32 09/03/2020 1426   ALKPHOS 88 09/03/2020 1426   BILITOT 0.9 09/03/2020 1426   GFRNONAA >60 09/03/2020 1426   GFRAA 102 11/26/2019 0916   No results found for: "CHOL", "HDL", "LDLCALC", "LDLDIRECT", "TRIG", "CHOLHDL" Lab Results  Component Value Date   HGBA1C 8.8 (H) 01/08/2021   No results found for: "VITAMINB12" Lab Results  Component Value Date   TSH 0.747 09/03/2020    Butler Denmark, AGNP-C, DNP 12/15/2021, 9:35 AM [facility] [address], [place], [place] [address] [phone]

## 2021-12-16 ENCOUNTER — Telehealth: Payer: Self-pay | Admitting: Neurology

## 2021-12-16 LAB — CBC WITH DIFFERENTIAL/PLATELET
Basophils Absolute: 0.1 10*3/uL (ref 0.0–0.2)
Basos: 1 %
EOS (ABSOLUTE): 0.2 10*3/uL (ref 0.0–0.4)
Eos: 4 %
Hematocrit: 36.5 % — ABNORMAL LOW (ref 37.5–51.0)
Hemoglobin: 12.4 g/dL — ABNORMAL LOW (ref 13.0–17.7)
Immature Grans (Abs): 0.1 10*3/uL (ref 0.0–0.1)
Immature Granulocytes: 1 %
Lymphocytes Absolute: 1.2 10*3/uL (ref 0.7–3.1)
Lymphs: 22 %
MCH: 31.7 pg (ref 26.6–33.0)
MCHC: 34 g/dL (ref 31.5–35.7)
MCV: 93 fL (ref 79–97)
Monocytes Absolute: 0.6 10*3/uL (ref 0.1–0.9)
Monocytes: 11 %
Neutrophils Absolute: 3.4 10*3/uL (ref 1.4–7.0)
Neutrophils: 61 %
Platelets: 215 10*3/uL (ref 150–450)
RBC: 3.91 x10E6/uL — ABNORMAL LOW (ref 4.14–5.80)
RDW: 12.7 % (ref 11.6–15.4)
WBC: 5.5 10*3/uL (ref 3.4–10.8)

## 2021-12-16 LAB — COMPREHENSIVE METABOLIC PANEL
ALT: 24 IU/L (ref 0–44)
AST: 25 IU/L (ref 0–40)
Albumin/Globulin Ratio: 1.5 (ref 1.2–2.2)
Albumin: 3.6 g/dL — ABNORMAL LOW (ref 3.8–4.8)
Alkaline Phosphatase: 78 IU/L (ref 44–121)
BUN/Creatinine Ratio: 16 (ref 10–24)
BUN: 15 mg/dL (ref 8–27)
Bilirubin Total: 0.8 mg/dL (ref 0.0–1.2)
CO2: 23 mmol/L (ref 20–29)
Calcium: 9 mg/dL (ref 8.6–10.2)
Chloride: 104 mmol/L (ref 96–106)
Creatinine, Ser: 0.92 mg/dL (ref 0.76–1.27)
Globulin, Total: 2.4 g/dL (ref 1.5–4.5)
Glucose: 209 mg/dL — ABNORMAL HIGH (ref 70–99)
Potassium: 4.9 mmol/L (ref 3.5–5.2)
Sodium: 138 mmol/L (ref 134–144)
Total Protein: 6 g/dL (ref 6.0–8.5)
eGFR: 86 mL/min/{1.73_m2} (ref >59)

## 2021-12-16 NOTE — Telephone Encounter (Signed)
-----   Message from Suzzanne Cloud, NP sent at 12/16/2021  5:46 AM EST ----- Please call, CBC and CMP are overall unremarkable, there is slightly low Hgb at 12.4, can monitor for any blood loss.  Random glucose was elevated at 209.  No medication changes.  Thanks.

## 2021-12-16 NOTE — Telephone Encounter (Signed)
Called the patient and his wife answered. Advised the pt's wife of the lab findings. She verbalized understanding. She states he has a PCP apt coming up in couple months and will make sure they monitor this. Pt's wife verbalized understanding. Pt's wife had no questions at this time but was encouraged to call back if questions arise.

## 2022-02-09 DIAGNOSIS — I2510 Atherosclerotic heart disease of native coronary artery without angina pectoris: Secondary | ICD-10-CM | POA: Diagnosis not present

## 2022-02-09 DIAGNOSIS — C61 Malignant neoplasm of prostate: Secondary | ICD-10-CM | POA: Diagnosis not present

## 2022-02-09 DIAGNOSIS — E7800 Pure hypercholesterolemia, unspecified: Secondary | ICD-10-CM | POA: Diagnosis not present

## 2022-02-09 DIAGNOSIS — E890 Postprocedural hypothyroidism: Secondary | ICD-10-CM | POA: Diagnosis not present

## 2022-02-09 DIAGNOSIS — E1149 Type 2 diabetes mellitus with other diabetic neurological complication: Secondary | ICD-10-CM | POA: Diagnosis not present

## 2022-02-18 DIAGNOSIS — R0602 Shortness of breath: Secondary | ICD-10-CM | POA: Diagnosis not present

## 2022-02-18 DIAGNOSIS — E890 Postprocedural hypothyroidism: Secondary | ICD-10-CM | POA: Diagnosis not present

## 2022-02-18 DIAGNOSIS — E1149 Type 2 diabetes mellitus with other diabetic neurological complication: Secondary | ICD-10-CM | POA: Diagnosis not present

## 2022-02-18 DIAGNOSIS — M1711 Unilateral primary osteoarthritis, right knee: Secondary | ICD-10-CM | POA: Diagnosis not present

## 2022-02-18 DIAGNOSIS — I2510 Atherosclerotic heart disease of native coronary artery without angina pectoris: Secondary | ICD-10-CM | POA: Diagnosis not present

## 2022-02-18 DIAGNOSIS — G7000 Myasthenia gravis without (acute) exacerbation: Secondary | ICD-10-CM | POA: Diagnosis not present

## 2022-02-18 DIAGNOSIS — I700 Atherosclerosis of aorta: Secondary | ICD-10-CM | POA: Diagnosis not present

## 2022-02-18 DIAGNOSIS — I10 Essential (primary) hypertension: Secondary | ICD-10-CM | POA: Diagnosis not present

## 2022-02-18 DIAGNOSIS — E7800 Pure hypercholesterolemia, unspecified: Secondary | ICD-10-CM | POA: Diagnosis not present

## 2022-02-18 DIAGNOSIS — R809 Proteinuria, unspecified: Secondary | ICD-10-CM | POA: Diagnosis not present

## 2022-03-03 ENCOUNTER — Encounter: Payer: Self-pay | Admitting: *Deleted

## 2022-03-03 ENCOUNTER — Encounter: Payer: Self-pay | Admitting: Cardiology

## 2022-03-03 DIAGNOSIS — G4733 Obstructive sleep apnea (adult) (pediatric): Secondary | ICD-10-CM

## 2022-03-03 DIAGNOSIS — R079 Chest pain, unspecified: Secondary | ICD-10-CM

## 2022-03-03 DIAGNOSIS — E538 Deficiency of other specified B group vitamins: Secondary | ICD-10-CM | POA: Insufficient documentation

## 2022-03-03 DIAGNOSIS — R42 Dizziness and giddiness: Secondary | ICD-10-CM

## 2022-03-03 DIAGNOSIS — K219 Gastro-esophageal reflux disease without esophagitis: Secondary | ICD-10-CM

## 2022-03-03 DIAGNOSIS — N529 Male erectile dysfunction, unspecified: Secondary | ICD-10-CM | POA: Insufficient documentation

## 2022-03-03 DIAGNOSIS — N400 Enlarged prostate without lower urinary tract symptoms: Secondary | ICD-10-CM

## 2022-03-03 DIAGNOSIS — L989 Disorder of the skin and subcutaneous tissue, unspecified: Secondary | ICD-10-CM

## 2022-03-03 DIAGNOSIS — M62838 Other muscle spasm: Secondary | ICD-10-CM

## 2022-03-03 DIAGNOSIS — M25569 Pain in unspecified knee: Secondary | ICD-10-CM

## 2022-03-03 DIAGNOSIS — R351 Nocturia: Secondary | ICD-10-CM

## 2022-03-03 DIAGNOSIS — Z9181 History of falling: Secondary | ICD-10-CM

## 2022-03-03 DIAGNOSIS — N410 Acute prostatitis: Secondary | ICD-10-CM

## 2022-03-03 DIAGNOSIS — Z860100 Personal history of colon polyps, unspecified: Secondary | ICD-10-CM

## 2022-03-03 DIAGNOSIS — R109 Unspecified abdominal pain: Secondary | ICD-10-CM

## 2022-03-03 DIAGNOSIS — E349 Endocrine disorder, unspecified: Secondary | ICD-10-CM

## 2022-03-03 DIAGNOSIS — R0681 Apnea, not elsewhere classified: Secondary | ICD-10-CM

## 2022-03-03 DIAGNOSIS — M2550 Pain in unspecified joint: Secondary | ICD-10-CM

## 2022-03-03 DIAGNOSIS — Z6829 Body mass index (BMI) 29.0-29.9, adult: Secondary | ICD-10-CM

## 2022-03-03 DIAGNOSIS — C61 Malignant neoplasm of prostate: Secondary | ICD-10-CM

## 2022-03-03 DIAGNOSIS — R29810 Facial weakness: Secondary | ICD-10-CM | POA: Insufficient documentation

## 2022-03-03 HISTORY — DX: Deficiency of other specified B group vitamins: E53.8

## 2022-03-03 HISTORY — DX: Obstructive sleep apnea (adult) (pediatric): G47.33

## 2022-03-03 HISTORY — DX: Other muscle spasm: M62.838

## 2022-03-03 HISTORY — DX: History of falling: Z91.81

## 2022-03-03 HISTORY — DX: Acute prostatitis: N41.0

## 2022-03-03 HISTORY — DX: Male erectile dysfunction, unspecified: N52.9

## 2022-03-03 HISTORY — DX: Disorder of the skin and subcutaneous tissue, unspecified: L98.9

## 2022-03-03 HISTORY — DX: Personal history of colon polyps, unspecified: Z86.0100

## 2022-03-03 HISTORY — DX: Endocrine disorder, unspecified: E34.9

## 2022-03-03 HISTORY — DX: Unspecified abdominal pain: R10.9

## 2022-03-03 HISTORY — DX: Nocturia: R35.1

## 2022-03-03 HISTORY — DX: Pain in unspecified joint: M25.50

## 2022-03-03 HISTORY — DX: Gastro-esophageal reflux disease without esophagitis: K21.9

## 2022-03-03 HISTORY — DX: Chest pain, unspecified: R07.9

## 2022-03-03 HISTORY — DX: Body mass index (BMI) 29.0-29.9, adult: Z68.29

## 2022-03-03 HISTORY — DX: Dizziness and giddiness: R42

## 2022-03-03 HISTORY — DX: Malignant neoplasm of prostate: C61

## 2022-03-03 HISTORY — DX: Apnea, not elsewhere classified: R06.81

## 2022-03-03 HISTORY — DX: Benign prostatic hyperplasia without lower urinary tract symptoms: N40.0

## 2022-03-03 HISTORY — DX: Pain in unspecified knee: M25.569

## 2022-03-27 NOTE — Progress Notes (Unsigned)
Cardiology Office Note:    Date:  03/29/2022   ID:  Mario Burns, DOB 1945-04-29, MRN [id number]  PCP:  Cyndi Bender, PA-C  Cardiologist:  Shirlee More, MD    Referring MD: Cyndi Bender, PA-C    ASSESSMENT:    1. Coronary artery disease involving native coronary artery of native heart without angina pectoris   2. Palpitations   3. Essential hypertension   4. Hyperlipidemia, unspecified hyperlipidemia type   5. Aortic dilatation (HCC)   6. Myasthenia gravis ([place])    PLAN:    In order of problems listed above:  He is having episodes of angina with and without activity on good medical therapy including aspirin he is taking 81 mg daily not on rate limiting calcium channel blocker with resting bradycardia and lipid-lowering with his statin.  Will add oral mononitrate's if symptoms do not improve or progress he need repeat angiography performed.  I encouraged him to take nitroglycerin as needed Event monitor background history of SVT For now continue current treatment including ARB we will reassess blood pressures next office visit Continue statin lipids are in good clinical target Stable mild enlargement of ascending aorta with consider imaging in future Stable   Next appointment: 3 months   Medication Adjustments/Labs and Tests Ordered: Current medicines are reviewed at length with the patient today.  Concerns regarding medicines are outlined above.  Orders Placed This Encounter  Procedures   EKG 12-Lead   No orders of the defined types were placed in this encounter.   Chief complaint I been having episodes of chest pain   History of Present Illness:    Mario Burns is a 77 y.o. male with a hx of hypertension hyperlipidemia brief SVT on previous event monitor and very mild enlargement ascending aorta 40 mm type 2 diabetes myasthenia gravis CAD with chronic total occlusion mid LAD low normal to mildly reduced ejection fraction 50 to 55% last seen  02/09/2021.  Compliance with diet, lifestyle and medications: Yes  He is here with his wife. He said he has been having spells with and without activity he gets discomfort in the left chest pressure that radiates to the left shoulder and so far has been relieved with rest.  He thought about but is never taken nitroglycerin they occur infrequently the pattern is stable He is not having exertional chest pain shortness of breath or syncope He has a background history of previous SVT and has areas where he feels that his heart jolts are races and will apply an event monitor today Past Medical History:  Diagnosis Date   Abnormal cardiac CT angiography    Acute prostatitis 03/03/2022   Adverse reaction to statin medication    Aortic atherosclerosis (HCC)    Arthralgia of multiple joints 03/03/2022   Arthritis    B12 deficiency 03/03/2022   Benign enlargement of prostate 03/03/2022   Bladder neck contracture    Borderline hyperlipidemia    Breathlessness on exertion 03/03/2022   CA of prostate ([place]) 03/03/2022   urologist Dr. Risa Grill, s/p radical prostatectomy   CAD (coronary artery disease)    Chronic right-sided low back pain with right-sided sciatica 04/26/2016   Claustrophobia    Complication of anesthesia    CLAUSTROPHOBIC   Diabetes mellitus ([place]) 12/16/2011   Dizziness 03/03/2022   ED (erectile dysfunction) of organic origin 03/03/2022   Esophageal reflux 03/03/2022   Essential hypertension, benign 06/26/2014   GERD (gastroesophageal reflux disease)    H/O hiatal hernia  History of thyroid nodule    LARGE GOITER   Hypercholesterolemia    Hyperlipidemia 06/26/2014   Hypothyroidism, postsurgical    Incisional hernia with obstruction 04/23/2013   Incisional hernia, without obstruction or gangrene 01/30/2013   MG (myasthenia gravis) ([place])    DX 2013 --  NEUROLOGIST--  DR WILLIS/  TREATED W/ MEDICATION UNTIL SYMPTOMS RESOLVED -- NO REMISSION--  SEES DR WILLIS PRN   Muscle  spasms of neck 03/03/2022   Obstructive sleep apnea 03/03/2022   intolerant of CPAP   Shortness of breath    ALWAYS--HAD FOR YEARS - STATES HE CAN'T DO HARDLY ANYTHING WITHOUT GIVING OUT   Skin lesion 03/03/2022   Type 2 diabetes mellitus (HCC)    Weak urinary stream     Past Surgical History:  Procedure Laterality Date   CATARACT EXTRACTION W/ INTRAOCULAR LENS IMPLANT Right 11/14/2018   CYSTOSCOPY N/A 07/30/2013   Procedure: CYSTOSCOPY WITH Anastasio Champion ;  Surgeon: Bernestine Amass, MD;  Location: [facility];  Service: Urology;  Laterality: N/A;   CYSTOSCOPY WITH URETHRAL DILATATION N/A 08/02/2012   Procedure: CYSTOSCOPY WITH URETHRAL DILATATION, AN REMOVAL STAPLE FROM BLADDER NECK;  Surgeon: Bernestine Amass, MD;  Location: [facility];  Service: Urology;  Laterality: N/A;   HEMORRHOID SURGERY  09/04/1988   INCISIONAL HERNIA REPAIR N/A 04/23/2013   Procedure: LAPAROSCOPIC INCISIONAL HERNIA;  Surgeon: [name], MD;  Location: [facility];  Service: General;  Laterality: N/A;   INSERTION OF MESH N/A 04/23/2013   Procedure: INSERTION OF MESH;  Surgeon: [name], MD;  Location: [facility];  Service: General;  Laterality: N/A;   LEFT HEART CATH AND CORONARY ANGIOGRAPHY N/A 08/26/2020   Procedure: LEFT HEART CATH AND CORONARY ANGIOGRAPHY;  Surgeon: Jettie Booze, MD;  Location: [facility];  Service: Cardiovascular;  Laterality: N/A;   ROBOT ASSISTED LAPAROSCOPIC RADICAL PROSTATECTOMY  12/15/2011   Procedure: ROBOTIC ASSISTED LAPAROSCOPIC RADICAL PROSTATECTOMY;  Surgeon: Bernestine Amass, MD;  Location: [facility];  Service: Urology;  Laterality: N/A;   TOTAL THYROIDECTOMY  05/24/2003    Current Medications: Current Meds  Medication Sig   acetaminophen (TYLENOL) 500 MG tablet Take 500 mg by mouth every 6 (six) hours as needed for mild pain or moderate pain.   amLODipine (NORVASC) 10 MG tablet Take 10 mg by mouth daily. Take 0.5 tablet (5 mg)  daily   azaTHIOprine (IMURAN) 50 MG tablet Take 50 mg by mouth 2 (two) times daily.   glimepiride (AMARYL) 4 MG tablet Take 4 mg by mouth 2 (two) times daily.   insulin lispro (HUMALOG KWIKPEN) 100 UNIT/ML KwikPen Inject 5 mLs into the skin 2 (two) times daily with a meal.   levothyroxine (SYNTHROID) 150 MCG tablet Take 150 mcg by mouth daily before breakfast.   losartan (COZAAR) 100 MG tablet Take 100 mg by mouth daily.   naproxen sodium (ALEVE) 220 MG tablet Take 440 mg by mouth daily.   nitroGLYCERIN (NITROSTAT) 0.4 MG SL tablet Place 1 tablet (0.4 mg total) under the tongue every 5 (five) minutes as needed.   omeprazole (PRILOSEC) 20 MG capsule Take 20 mg by mouth every evening.    OVER THE COUNTER MEDICATION Take 1 tablet by mouth daily. Total Beets Supplement   pyridostigmine (MESTINON) 60 MG tablet Take 0.5 tablets (30 mg total) by mouth 3 (three) times daily.   rosuvastatin (CRESTOR) 10 MG tablet Take 5 mg by mouth daily.   TOUJEO MAX SOLOSTAR 300  UNIT/ML SOPN Inject 18 Units into the skin at bedtime.     Allergies:   Bydureon [exenatide], Colestid [colestipol hcl], Hydrochlorothiazide, Invokana [canagliflozin], Januvia [sitagliptin], Levemir [insulin detemir], Lisinopril, Lovastatin, Metformin and related, Pioglitazone, Pravastatin sodium, Sulfa antibiotics, Toujeo max solostar [insulin glargine], and Victoza [liraglutide]   Social History   Socioeconomic History   Marital status: Married    Spouse name: Doris   Number of children: 1   Years of education: 10th   Highest education level: Not on file  Occupational History   Occupation: semi-retired  Tobacco Use   Smoking status: Never   Smokeless tobacco: Never  Vaping Use   Vaping Use: Never used  Substance and Sexual Activity   Alcohol use: No   Drug use: No   Sexual activity: Not on file  Other Topics Concern   Not on file  Social History Narrative   Lives at home with his wife.   Right-handed.   2-3 cups caffeine  per day.   Social Determinants of Health   Financial Resource Strain: Not on file  Food Insecurity: Not on file  Transportation Needs: Not on file  Physical Activity: Not on file  Stress: Not on file  Social Connections: Not on file     Family History: The patient's family history includes Cancer in his brother; Colon cancer in his father; Diabetes in his brother, brother, and sister; Glaucoma in his mother; Heart disease in his mother; Hypertension in his mother. ROS:   Please see the history of present illness.    All other systems reviewed and are negative.  EKGs/Labs/Other Studies Reviewed:    The following studies were reviewed today:  Cardiac Studies & Procedures   CARDIAC CATHETERIZATION  CARDIAC CATHETERIZATION 08/26/2020  Narrative   Mid Cx lesion is 60% stenosed.   Prox Cx to Mid Cx lesion is 50% stenosed.   Mid LAD lesion is 100% stenosed.   Dist LAD lesion is 100% stenosed.   The left ventricular systolic function is normal.   LV end diastolic pressure is normal.   The left ventricular ejection fraction is 55-65% by visual estimate.   There is no aortic valve stenosis.  Chronic total occlusion of the mid LAD.  There may be a distal LAD occlusion as well.  Collaterals fill the mid to distal LAD.  Medical therapy.  Sclerotic spine lesion currently under investigation.  Findings Coronary Findings Diagnostic  Dominance: Right  Left Anterior Descending Collaterals Dist LAD filled by collaterals from RPDA.  Collaterals Dist LAD filled by collaterals from RPDA.  Mid LAD lesion is 100% stenosed. The lesion is chronically occluded with right-to-left and left-to-left collateral flow. Dist LAD lesion is 100% stenosed. The lesion is chronically occluded.  Second Septal Branch Collaterals 2nd Sept filled by collaterals from 1st Sept.  Left Circumflex Prox Cx to Mid Cx lesion is 50% stenosed. Mid Cx lesion is 60% stenosed.  Right Coronary Artery Vessel is  large. The vessel exhibits minimal luminal irregularities.  Intervention  No interventions have been documented.     ECHOCARDIOGRAM  ECHOCARDIOGRAM COMPLETE 08/14/2020  Narrative ECHOCARDIOGRAM REPORT    Patient Name:   Mario Burns Date of Exam: 08/14/2020 Medical Rec #:  [id number]       Height:       69.0 in Accession #:    [id number]      Weight:       176.6 lb Date of Birth:  May 01, 1945        BSA:  1.960 m Patient Age:    41 years        BP:           162/98 mmHg Patient Gender: M               HR:           61 bpm. Exam Location:  [place]  Procedure: 2D Echo, Cardiac Doppler and Color Doppler  Indications:    R06.00 Dyspnea  History:        Patient has no prior history of Echocardiogram examinations. Risk Factors:Hypertension and Diabetes. Obstructive sleep apnea. Shortness of breath.  Sonographer:    Wilford Sports Rodgers-Jones RDCS Referring Phys: [id number] [place]   1. Left ventricular ejection fraction, by estimation, is 50 to 55%. The left ventricle has low normal function. The left ventricle has no regional wall motion abnormalities. Left ventricular diastolic parameters are consistent with Grade I diastolic dysfunction (impaired relaxation). 2. Right ventricular systolic function is normal. The right ventricular size is normal. 3. The mitral valve is normal in structure. Trivial mitral valve regurgitation. No evidence of mitral stenosis. 4. The aortic valve is tricuspid. Aortic valve regurgitation is trivial. Mild aortic valve sclerosis is present, with no evidence of aortic valve stenosis. 5. Aortic dilatation noted. There is mild dilatation of the ascending aorta, measuring 41 mm.  FINDINGS Left Ventricle: Left ventricular ejection fraction, by estimation, is 50 to 55%. The left ventricle has low normal function. The left ventricle has no regional wall motion abnormalities. The left ventricular internal cavity size was  normal in size. There is no left ventricular hypertrophy. Left ventricular diastolic parameters are consistent with Grade I diastolic dysfunction (impaired relaxation).  Right Ventricle: The right ventricular size is normal. Right ventricular systolic function is normal.  Left Atrium: Left atrial size was normal in size.  Right Atrium: Right atrial size was normal in size.  Pericardium: There is no evidence of pericardial effusion.  Mitral Valve: The mitral valve is normal in structure. Trivial mitral valve regurgitation. No evidence of mitral valve stenosis.  Tricuspid Valve: The tricuspid valve is normal in structure. Tricuspid valve regurgitation is mild . No evidence of tricuspid stenosis.  Aortic Valve: The aortic valve is tricuspid. Aortic valve regurgitation is trivial. Mild aortic valve sclerosis is present, with no evidence of aortic valve stenosis.  Pulmonic Valve: The pulmonic valve was normal in structure. Pulmonic valve regurgitation is trivial. No evidence of pulmonic stenosis.  Aorta: Aortic dilatation noted. There is mild dilatation of the ascending aorta, measuring 41 mm.  Venous: The inferior vena cava was not well visualized.  IAS/Shunts: The interatrial septum appears to be lipomatous. No atrial level shunt detected by color flow Doppler.   LEFT VENTRICLE PLAX 2D LVIDd:         4.10 cm  Diastology LVIDs:         2.90 cm  LV e' medial:    6.42 cm/s LV PW:         0.90 cm  LV E/e' medial:  10.5 LV IVS:        0.90 cm  LV e' lateral:   6.31 cm/s LVOT diam:     1.90 cm  LV E/e' lateral: 10.7 LV SV:         66 LV SV Index:   33 LVOT Area:     2.84 cm   RIGHT VENTRICLE RV Basal diam:  4.60 cm RV S prime:  13.40 cm/s TAPSE (M-mode): 2.1 cm  LEFT ATRIUM             Index       RIGHT ATRIUM           Index LA diam:        4.00 cm 2.04 cm/m  RA Area:     14.20 cm LA Vol (A2C):   53.8 ml 27.45 ml/m RA Volume:   39.40 ml  20.11 ml/m LA Vol (A4C):   41.9  ml 21.38 ml/m LA Biplane Vol: 48.4 ml 24.70 ml/m AORTIC VALVE LVOT Vmax:   111.50 cm/s LVOT Vmean:  71.700 cm/s LVOT VTI:    0.232 m  AORTA Ao Root diam: 3.80 cm Ao Asc diam:  4.30 cm  MITRAL VALVE               TRICUSPID VALVE MV Area (PHT): 2.91 cm    TR Peak grad:   24.6 mmHg MV Decel Time: 261 msec    TR Vmax:        248.00 cm/s MV E velocity: 67.70 cm/s MV A velocity: 93.80 cm/s  SHUNTS MV E/A ratio:  0.72        Systemic VTI:  0.23 m Systemic Diam: 1.90 cm  Kirk Ruths MD Electronically signed by Kirk Ruths MD Signature Date/Time: 08/14/2020/12:42:43 PM    Final    MONITORS  LONG TERM MONITOR (3-14 DAYS) 10/23/2020  Narrative  1 episode of NSVT lasting 6 beats  9 episodes of SVT, longest lasting 12 beats  Second Degree AV Block-Mobitz I was present   Patch Wear Time:  13 days and 23 hours (2022-09-17T18:00:23-0400 to 2022-10-01T17:56:13-0400)  Patient had a min HR of 26 bpm, max HR of 231 bpm, and avg HR of 73 bpm. Predominant underlying rhythm was Sinus Rhythm. 1 run of Ventricular Tachycardia occurred lasting 6 beats with a max rate of 231 bpm (avg 209 bpm). 9 Supraventricular Tachycardia runs occurred, the run with the fastest interval lasting 7 beats with a max rate of 135 bpm, the longest lasting 12 beats with an avg rate of 107 bpm. Second Degree AV Block-Mobitz I (Wenckebach) was present. Isolated SVEs were rare (<1.0%), SVE Couplets were rare (<1.0%), and SVE Triplets were rare (<1.0%). Isolated VEs were rare (<1.0%), and no VE Couplets or VE Triplets were present.  No patient triggered events   CT SCANS  CT CORONARY MORPH W/CTA COR W/SCORE 08/15/2020  Addendum 08/15/2020  2:34 PM ADDENDUM REPORT: 08/15/2020 14:32  HISTORY: Chest pain, nonspecific  EXAM: Cardiac/Coronary  CT  TECHNIQUE: The patient was scanned on a [employer].  PROTOCOL: A 120 kV prospective scan was triggered in the descending thoracic aorta at 111 HU's.  Axial non-contrast 3 mm slices were carried out through the heart. The data set was analyzed on a dedicated work station and scored using the Agatston method. Gantry rotation speed was 250 msecs and collimation was .6 mm. Beta blockade and 0.8 mg of sl NTG was given. The 3D data set was reconstructed in 5% intervals of the 35-75 % of the R-R cycle. Systolic and diastolic phases were analyzed on a dedicated work station using MPR, MIP and VRT modes. The patient received 11mL OMNIPAQUE IOHEXOL 350 MG/ML SOLN contrast.  FINDINGS: Image quality: Good.  Noise artifact is: Mild misregistration.  Coronary calcium score is 0.  Coronary arteries: Normal coronary origins.  Right dominance.  Right Coronary Artery: Minimal atherosclerotic plaque in the mid RCA, <25% stenosis.  Left Main Coronary Artery: No detectable plaque or stenosis.  Left Anterior Descending Coronary Artery: Probable total occlusion of the mid LAD just beyond the branch point of the first septal perforator and first diagonal artery, 100% stenosis with atherosclerotic plaque. Possible channel of flow with perfusion of distal LAD vs collateral flow from left to left collaterals.  Left Circumflex Artery: Minimal atherosclerotic plaque in the proximal L circumflex, <25% stenosis.  Aorta:  Measurements made in double oblique technique:  Sinus of Valsalva (sinus to sinus):  R-L: 40 mm  L-Non: 40 mm  R-Non: 38 mm  ST junction: 32 mm  Mid ascending aorta (at PA bifurcation): 38 mm  Mid descending aorta (at level of pulmonary veins): 27 mm  Distal descending aorta (just proximal to aortic hiatus): 25 mm  Aortic Valve: No calcifications.  Other findings:  Normal pulmonary vein drainage into the left atrium.  Normal left atrial appendage without a thrombus.  Normal size of the pulmonary artery.  IMPRESSION: 1. Severe CAD with probable total occlusion of the mid LAD, CADRADS = 5.  2. Coronary calcium  score of 0.  3. Normal coronary origin with right dominance.  4.  Mild ascending aorta dilation, 40 mm at sinus of Valsalva  RECOMMENDATIONS Consider cardiac catheterization for mid LAD occlusion.   Electronically Signed By: Cherlynn Kaiser M.D. On: 08/15/2020 14:32  Narrative EXAM: OVER-READ INTERPRETATION  CT CHEST  The following report is an over-read performed by radiologist Dr. Aletta Edouard of [facility], [place] on 08/15/2020. This over-read does not include interpretation of cardiac or coronary anatomy or pathology. The coronary CTA interpretation by the cardiologist is attached.  COMPARISON:  CT of the chest on 03/05/2011  FINDINGS: Vascular: No significant noncardiac vascular findings.  Mediastinum/Nodes: Visualized mediastinum and hilar regions demonstrate no lymphadenopathy or masses. Small hiatal hernia present.  Lungs/Pleura: Visualized lungs show no evidence of pulmonary edema, consolidation, pneumothorax, nodule or pleural fluid.  Upper Abdomen: No acute abnormality.  Musculoskeletal: Rounded area of sclerosis measuring up to 18 mm is identified in the T8 vertebral body just to the right of midline and was not definitely present on the 2013 chest CT. This is not associated with fracture. No other sclerotic or lytic bone lesions identified in visualized bony structures.  IMPRESSION: 1. Rounded area of sclerosis measuring roughly 1.8 cm in greatest diameter within the T8 vertebral body which was not present on a 2013 chest CT. The patient does have a history of prior prostate carcinoma and this may represent a focus of metastatic disease. It is unclear whether this is active metastatic disease and correlation suggested initially with PSA level. Referral to the patient's urologist is recommended. 2. Small hiatal hernia.  Electronically Signed: By: Aletta Edouard M.D. On: 08/15/2020 12:30          EKG:  EKG ordered today and personally  reviewed.  The ekg ordered today demonstrates sinus rhythm 61 bpm first-degree AV block otherwise normal EKG  Recent Labs: 02/10/2012 cholesterol 145 LDL 80 A1c 8.9% hemoglobin 12.6 creatinine 0.98 potassium 4.1  Physical Exam:    VS:  BP (!) 158/84 (BP Location: Right Arm, Patient Position: Sitting)   Pulse 61   Ht 5\' 9"  (1.753 m)   Wt 184 lb (83.5 kg)   SpO2 98%   BMI 27.17 kg/m     Wt Readings from Last 3 Encounters:  03/29/22 184 lb (83.5 kg)  02/18/22 183 lb (83 kg)  12/15/21 186 lb (84.4 kg)  GEN:  Well nourished, well developed in no acute distress HEENT: Normal NECK: No JVD; No carotid bruits LYMPHATICS: No lymphadenopathy CARDIAC: RRR, no murmurs, rubs, gallops RESPIRATORY:  Clear to auscultation without rales, wheezing or rhonchi  ABDOMEN: Soft, non-tender, non-distended MUSCULOSKELETAL:  No edema; No deformity  SKIN: Warm and dry NEUROLOGIC:  Alert and oriented x 3 PSYCHIATRIC:  Normal affect    Signed, Shirlee More, MD  03/29/2022 12:11 PM    [facility]

## 2022-03-29 ENCOUNTER — Ambulatory Visit: Payer: PPO | Attending: Cardiology

## 2022-03-29 ENCOUNTER — Ambulatory Visit: Payer: PPO | Attending: Cardiology | Admitting: Cardiology

## 2022-03-29 ENCOUNTER — Other Ambulatory Visit: Payer: Self-pay

## 2022-03-29 ENCOUNTER — Encounter: Payer: Self-pay | Admitting: Cardiology

## 2022-03-29 VITALS — BP 158/84 | HR 61 | Ht 69.0 in | Wt 184.0 lb

## 2022-03-29 DIAGNOSIS — I10 Essential (primary) hypertension: Secondary | ICD-10-CM

## 2022-03-29 DIAGNOSIS — R002 Palpitations: Secondary | ICD-10-CM | POA: Diagnosis not present

## 2022-03-29 DIAGNOSIS — I2510 Atherosclerotic heart disease of native coronary artery without angina pectoris: Secondary | ICD-10-CM

## 2022-03-29 DIAGNOSIS — G7000 Myasthenia gravis without (acute) exacerbation: Secondary | ICD-10-CM

## 2022-03-29 DIAGNOSIS — E785 Hyperlipidemia, unspecified: Secondary | ICD-10-CM

## 2022-03-29 DIAGNOSIS — I77819 Aortic ectasia, unspecified site: Secondary | ICD-10-CM | POA: Diagnosis not present

## 2022-03-29 MED ORDER — ASPIRIN 81 MG PO TBEC: 81.0000 mg | DELAYED_RELEASE_TABLET | Freq: Every day | ORAL | 3 refills | Status: DC

## 2022-03-29 MED ORDER — ISOSORBIDE MONONITRATE ER 30 MG PO TB24: 30.0000 mg | ORAL_TABLET | Freq: Every day | ORAL | 3 refills | Status: DC

## 2022-03-29 NOTE — Patient Instructions (Signed)
Medication Instructions:  Your physician has recommended you make the following change in your medication:   START: Aspirin 81 mg daily START: Imdur 30 mg daily  *If you need a refill on your cardiac medications before your next appointment, please call your pharmacy*   Lab Work: None If you have labs (blood work) drawn today and your tests are completely normal, you will receive your results only by: [place] (if you have MyChart) OR A paper copy in the mail If you have any lab test that is abnormal or we need to change your treatment, we will call you to review the results.   Testing/Procedures: A zio monitor was ordered today. It will remain on for 14 days. You will then return monitor and event diary in provided box. It takes 1-2 weeks for report to be downloaded and returned to [place]. We will call you with the results. If monitor falls off or has orange flashing light, please call [employer] for further instructions.     Follow-Up: At [facility], you and your health needs are our priority.  As part of our continuing mission to provide you with exceptional heart care, we have created designated Provider Care Teams.  These Care Teams include your primary Cardiologist (physician) and Advanced Practice Providers (APPs -  Physician Assistants and Nurse Practitioners) who all work together to provide you with the care you need, when you need it.  We recommend signing up for the patient portal called "MyChart".  Sign up information is provided on this After Visit Summary.  MyChart is used to connect with patients for Virtual Visits (Telemedicine).  Patients are able to view lab/test results, encounter notes, upcoming appointments, etc.  Non-urgent messages can be sent to your provider as well.   To learn more about what you can do with MyChart, go to [email or web address].    Your next appointment:   3 month(s)  Provider:   Shirlee More, MD    Other Instructions None

## 2022-04-19 DIAGNOSIS — R002 Palpitations: Secondary | ICD-10-CM | POA: Diagnosis not present

## 2022-05-10 ENCOUNTER — Other Ambulatory Visit: Payer: Self-pay | Admitting: Neurology

## 2022-05-10 ENCOUNTER — Other Ambulatory Visit: Payer: Self-pay

## 2022-05-10 ENCOUNTER — Telehealth: Payer: Self-pay

## 2022-05-10 MED ORDER — AZATHIOPRINE 50 MG PO TABS: 50.0000 mg | ORAL_TABLET | Freq: Two times a day (BID) | ORAL | 1 refills | Status: DC

## 2022-05-10 NOTE — Telephone Encounter (Signed)
Received refill requests from pharmacy, Imuran 50mg  2 tablets 2x daily. Call to wife Tyler Aas, confirmed what patient has been taking as refill is different than last prescribed. Wife states patient is taking 50 mg in the morning and 50 mg in the evening.advised that is correct and refill as prescribed will be sent to pharmacy. Wife appreciative of call.

## 2022-05-14 DIAGNOSIS — E890 Postprocedural hypothyroidism: Secondary | ICD-10-CM | POA: Diagnosis not present

## 2022-05-14 DIAGNOSIS — E1149 Type 2 diabetes mellitus with other diabetic neurological complication: Secondary | ICD-10-CM | POA: Diagnosis not present

## 2022-05-14 DIAGNOSIS — E7800 Pure hypercholesterolemia, unspecified: Secondary | ICD-10-CM | POA: Diagnosis not present

## 2022-05-14 DIAGNOSIS — I2510 Atherosclerotic heart disease of native coronary artery without angina pectoris: Secondary | ICD-10-CM | POA: Diagnosis not present

## 2022-05-20 DIAGNOSIS — I700 Atherosclerosis of aorta: Secondary | ICD-10-CM | POA: Diagnosis not present

## 2022-05-20 DIAGNOSIS — M5431 Sciatica, right side: Secondary | ICD-10-CM | POA: Diagnosis not present

## 2022-05-20 DIAGNOSIS — E7800 Pure hypercholesterolemia, unspecified: Secondary | ICD-10-CM | POA: Diagnosis not present

## 2022-05-20 DIAGNOSIS — G7000 Myasthenia gravis without (acute) exacerbation: Secondary | ICD-10-CM | POA: Diagnosis not present

## 2022-05-20 DIAGNOSIS — M1711 Unilateral primary osteoarthritis, right knee: Secondary | ICD-10-CM | POA: Diagnosis not present

## 2022-05-20 DIAGNOSIS — E890 Postprocedural hypothyroidism: Secondary | ICD-10-CM | POA: Diagnosis not present

## 2022-05-20 DIAGNOSIS — I2510 Atherosclerotic heart disease of native coronary artery without angina pectoris: Secondary | ICD-10-CM | POA: Diagnosis not present

## 2022-05-20 DIAGNOSIS — I10 Essential (primary) hypertension: Secondary | ICD-10-CM | POA: Diagnosis not present

## 2022-05-20 DIAGNOSIS — E1149 Type 2 diabetes mellitus with other diabetic neurological complication: Secondary | ICD-10-CM | POA: Diagnosis not present

## 2022-05-20 DIAGNOSIS — R809 Proteinuria, unspecified: Secondary | ICD-10-CM | POA: Diagnosis not present

## 2022-06-04 DIAGNOSIS — G7000 Myasthenia gravis without (acute) exacerbation: Secondary | ICD-10-CM | POA: Diagnosis not present

## 2022-06-04 DIAGNOSIS — I129 Hypertensive chronic kidney disease with stage 1 through stage 4 chronic kidney disease, or unspecified chronic kidney disease: Secondary | ICD-10-CM | POA: Diagnosis not present

## 2022-06-04 DIAGNOSIS — C61 Malignant neoplasm of prostate: Secondary | ICD-10-CM | POA: Diagnosis not present

## 2022-06-04 DIAGNOSIS — N182 Chronic kidney disease, stage 2 (mild): Secondary | ICD-10-CM | POA: Diagnosis not present

## 2022-06-04 DIAGNOSIS — E1122 Type 2 diabetes mellitus with diabetic chronic kidney disease: Secondary | ICD-10-CM | POA: Diagnosis not present

## 2022-06-04 DIAGNOSIS — R809 Proteinuria, unspecified: Secondary | ICD-10-CM | POA: Diagnosis not present

## 2022-06-10 ENCOUNTER — Other Ambulatory Visit: Payer: Self-pay | Admitting: Nephrology

## 2022-06-10 DIAGNOSIS — R809 Proteinuria, unspecified: Secondary | ICD-10-CM

## 2022-06-17 ENCOUNTER — Ambulatory Visit: Payer: PPO | Admitting: Neurology

## 2022-06-29 DIAGNOSIS — I2510 Atherosclerotic heart disease of native coronary artery without angina pectoris: Secondary | ICD-10-CM | POA: Insufficient documentation

## 2022-06-29 DIAGNOSIS — E039 Hypothyroidism, unspecified: Secondary | ICD-10-CM

## 2022-06-29 DIAGNOSIS — K5901 Slow transit constipation: Secondary | ICD-10-CM

## 2022-06-29 DIAGNOSIS — R634 Abnormal weight loss: Secondary | ICD-10-CM

## 2022-06-29 DIAGNOSIS — K449 Diaphragmatic hernia without obstruction or gangrene: Secondary | ICD-10-CM | POA: Insufficient documentation

## 2022-06-29 DIAGNOSIS — K2100 Gastro-esophageal reflux disease with esophagitis, without bleeding: Secondary | ICD-10-CM | POA: Insufficient documentation

## 2022-06-29 DIAGNOSIS — K921 Melena: Secondary | ICD-10-CM

## 2022-06-29 DIAGNOSIS — K649 Unspecified hemorrhoids: Secondary | ICD-10-CM

## 2022-06-29 DIAGNOSIS — K625 Hemorrhage of anus and rectum: Secondary | ICD-10-CM | POA: Insufficient documentation

## 2022-06-29 HISTORY — DX: Gastro-esophageal reflux disease with esophagitis, without bleeding: K21.00

## 2022-06-29 HISTORY — DX: Abnormal weight loss: R63.4

## 2022-06-29 HISTORY — DX: Slow transit constipation: K59.01

## 2022-06-29 HISTORY — DX: Diaphragmatic hernia without obstruction or gangrene: K44.9

## 2022-06-29 HISTORY — DX: Hypothyroidism, unspecified: E03.9

## 2022-06-29 HISTORY — DX: Unspecified hemorrhoids: K64.9

## 2022-06-29 HISTORY — DX: Atherosclerotic heart disease of native coronary artery without angina pectoris: I25.10

## 2022-06-29 HISTORY — DX: Melena: K92.1

## 2022-06-29 HISTORY — DX: Hemorrhage of anus and rectum: K62.5

## 2022-07-01 NOTE — Progress Notes (Signed)
Cardiology Office Note:    Date:  07/02/2022   ID:  Mario Burns, DOB 1945/02/01, MRN [id number]  PCP:  Lonie Peak, PA-C  Cardiologist:  Norman Herrlich, MD    Referring MD: Lonie Peak, PA-C    ASSESSMENT:    1. Coronary artery disease involving native coronary artery of native heart without angina pectoris   2. Ascending aorta enlargement (HCC)   3. Essential hypertension   4. Hyperlipidemia, unspecified hyperlipidemia type   5. Myasthenia gravis (HCC)    PLAN:    In order of problems listed above:  Complex visit with a lot of medication issues and when asking to go back on amlodipine reduced dose he is not taking Imdur and continue his other medical therapy not having angina at this time I do not think he needs a repeat heart catheterization. Will check CT of the chest no contrast for progression Resume low-dose amlodipine continue his diuretic and his ARB With muscle weakness withdrawal his statin for a brief period of time 4 weeks to look for improvement if he is improved transition to PCSK9 inhibitor   Next appointment: 6 months   Medication Adjustments/Labs and Tests Ordered: Current medicines are reviewed at length with the patient today.  Concerns regarding medicines are outlined above.  No orders of the defined types were placed in this encounter.  No orders of the defined types were placed in this encounter.    History of Present Illness:    Mario Burns is a 77 y.o. male with a hx of CAD with chronic total occlusion of the mid left anterior descending coronary artery and low normal to mildly reduced ejection fraction 50 to 55%, brief SVT on previous event monitor, mildly enlarged ascending aorta 40 mm myasthenia gravis hypertension hyperlipidemia and type 2 diabetes last seen 03/29/2022.  He had an event my Nadear 04/21/2022 showing rare supraventricular and rare ventricular arrhythmia.  He had Mobitz 1 second-degree AV block noted nocturnally 12 brief  events of SVT longest 14 complexes.  Sinus rhythm throughout.  Compliance with diet, lifestyle and medications: No  After meeting with patient becomes obvious he is stopped taking Imdur because of headaches and he had been on amlodipine and had stopped it on his own. Recently his blood pressure is 140 and 160 systolic and I asked him to start a low-dose 5 mg every other day discussed good technique to avoid variability record his home blood pressures and when he comes back to drop off a list in a month tell me if his muscle weakness is improved He has myasthenia gravis and he thinks he has increased weakness he is taking a high intensity statin he will withdraw for 1 month if improved we could use a PCSK9 inhibitor He asked me how he would know if his coronary disease is worse and I told him in his case I think the only test we could do is coronary angiography and at this point in time I would not do it with stable CAD having no anginal discomfort Past Medical History:  Diagnosis Date   Abnormal cardiac CT angiography    Acute prostatitis 03/03/2022   Adverse reaction to statin medication    Aortic atherosclerosis (HCC)    Arthralgia of multiple joints 03/03/2022   Arthritis    B12 deficiency 03/03/2022   Benign enlargement of prostate 03/03/2022   Bladder neck contracture    Borderline hyperlipidemia    Breathlessness on exertion 03/03/2022   CA of prostate (HCC)  03/03/2022   urologist Dr. Isabel Caprice, s/p radical prostatectomy   CAD (coronary artery disease)    Chronic right-sided low back pain with right-sided sciatica 04/26/2016   Claustrophobia    Complication of anesthesia    CLAUSTROPHOBIC   Diabetes mellitus (HCC) 12/16/2011   Dizziness 03/03/2022   ED (erectile dysfunction) of organic origin 03/03/2022   Esophageal reflux 03/03/2022   Essential hypertension, benign 06/26/2014   GERD (gastroesophageal reflux disease)    H/O hiatal hernia    History of thyroid nodule    LARGE  GOITER   Hypercholesterolemia    Hyperlipidemia 06/26/2014   Hypothyroidism, postsurgical    Incisional hernia with obstruction 04/23/2013   Incisional hernia, without obstruction or gangrene 01/30/2013   MG (myasthenia gravis) (HCC)    DX 2013 --  NEUROLOGIST--  DR WILLIS/  TREATED W/ MEDICATION UNTIL SYMPTOMS RESOLVED -- NO REMISSION--  SEES DR WILLIS PRN   Muscle spasms of neck 03/03/2022   Obstructive sleep apnea 03/03/2022   intolerant of CPAP   Shortness of breath    ALWAYS--HAD FOR YEARS - STATES HE CAN'T DO HARDLY ANYTHING WITHOUT GIVING OUT   Skin lesion 03/03/2022   Type 2 diabetes mellitus (HCC)    Weak urinary stream     Past Surgical History:  Procedure Laterality Date   CATARACT EXTRACTION W/ INTRAOCULAR LENS IMPLANT Right 11/14/2018   CYSTOSCOPY N/A 07/30/2013   Procedure: CYSTOSCOPY WITH Eustaquio Burns ;  Surgeon: Valetta Fuller, MD;  Location: [facility];  Service: Urology;  Laterality: N/A;   CYSTOSCOPY WITH URETHRAL DILATATION N/A 08/02/2012   Procedure: CYSTOSCOPY WITH URETHRAL DILATATION, AN REMOVAL STAPLE FROM BLADDER NECK;  Surgeon: Valetta Fuller, MD;  Location: [facility];  Service: Urology;  Laterality: N/A;   HEMORRHOID SURGERY  09/04/1988   INCISIONAL HERNIA REPAIR N/A 04/23/2013   Procedure: LAPAROSCOPIC INCISIONAL HERNIA;  Surgeon: Ernestene Mention, MD;  Location: [facility];  Service: General;  Laterality: N/A;   INSERTION OF MESH N/A 04/23/2013   Procedure: INSERTION OF MESH;  Surgeon: Ernestene Mention, MD;  Location: [facility];  Service: General;  Laterality: N/A;   LEFT HEART CATH AND CORONARY ANGIOGRAPHY N/A 08/26/2020   Procedure: LEFT HEART CATH AND CORONARY ANGIOGRAPHY;  Surgeon: Corky Crafts, MD;  Location[facility];  Service: Cardiovascular;  Laterality: N/A;   ROBOT ASSISTED LAPAROSCOPIC RADICAL PROSTATECTOMY  12/15/2011   Procedure: ROBOTIC ASSISTED LAPAROSCOPIC RADICAL PROSTATECTOMY;  Surgeon:  Valetta Fuller, MD;  Location: [facility];  Service: Urology;  Laterality: N/A;   TOTAL THYROIDECTOMY  05/24/2003    Current Medications: Current Meds  Medication Sig   acetaminophen (TYLENOL) 500 MG tablet Take 500 mg by mouth every 6 (six) hours as needed for mild pain or moderate pain.   aspirin EC 81 MG tablet Take 1 tablet (81 mg total) by mouth daily. Swallow whole.   azaTHIOprine (IMURAN) 50 MG tablet Take 1 tablet (50 mg total) by mouth 2 (two) times daily.   glimepiride (AMARYL) 4 MG tablet Take 4 mg by mouth 2 (two) times daily.   insulin lispro (HUMALOG KWIKPEN) 100 UNIT/ML KwikPen Inject 5 mLs into the skin 2 (two) times daily with a meal.   isosorbide mononitrate (IMDUR) 30 MG 24 hr tablet Take 1 tablet (30 mg total) by mouth daily.   levothyroxine (SYNTHROID) 150 MCG tablet Take 150 mcg by mouth daily before breakfast.   losartan (COZAAR) 100 MG tablet Take 100 mg by mouth  daily.   naproxen sodium (ALEVE) 220 MG tablet Take 440 mg by mouth daily.   omeprazole (PRILOSEC) 20 MG capsule Take 20 mg by mouth every evening.    OVER THE COUNTER MEDICATION Take 1 tablet by mouth daily. Total Beets Supplement   pyridostigmine (MESTINON) 60 MG tablet Take 0.5 tablets (30 mg total) by mouth 3 (three) times daily.   TOUJEO MAX SOLOSTAR 300 UNIT/ML SOPN Inject 18 Units into the skin at bedtime.   [DISCONTINUED] amLODipine (NORVASC) 10 MG tablet Take 10 mg by mouth daily. Take 0.5 tablet (5 mg) daily   [DISCONTINUED] rosuvastatin (CRESTOR) 10 MG tablet Take 5 mg by mouth daily.     Allergies:   Bydureon [exenatide], Colestid [colestipol hcl], Hydrochlorothiazide, Invokana [canagliflozin], Januvia [sitagliptin], Levemir [insulin detemir], Lisinopril, Lovastatin, Metformin and related, Pioglitazone, Pravastatin sodium, Sulfa antibiotics, Toujeo max solostar [insulin glargine], and Victoza [liraglutide]   EKGs/Labs/Other Studies Reviewed:    The following studies were reviewed today:  Cardiac  Studies & Procedures   CARDIAC CATHETERIZATION  CARDIAC CATHETERIZATION 08/26/2020  Narrative   Mid Cx lesion is 60% stenosed.   Prox Cx to Mid Cx lesion is 50% stenosed.   Mid LAD lesion is 100% stenosed.   Dist LAD lesion is 100% stenosed.   The left ventricular systolic function is normal.   LV end diastolic pressure is normal.   The left ventricular ejection fraction is 55-65% by visual estimate.   There is no aortic valve stenosis.  Chronic total occlusion of the mid LAD.  There may be a distal LAD occlusion as well.  Collaterals fill the mid to distal LAD.  Medical therapy.  Sclerotic spine lesion currently under investigation.  Findings Coronary Findings Diagnostic  Dominance: Right  Left Anterior Descending Collaterals Dist LAD filled by collaterals from RPDA.  Collaterals Dist LAD filled by collaterals from RPDA.  Mid LAD lesion is 100% stenosed. The lesion is chronically occluded with right-to-left and left-to-left collateral flow. Dist LAD lesion is 100% stenosed. The lesion is chronically occluded.  Second Septal Branch Collaterals 2nd Sept filled by collaterals from 1st Sept.  Left Circumflex Prox Cx to Mid Cx lesion is 50% stenosed. Mid Cx lesion is 60% stenosed.  Right Coronary Artery Vessel is large. The vessel exhibits minimal luminal irregularities.  Intervention  No interventions have been documented.     ECHOCARDIOGRAM  ECHOCARDIOGRAM COMPLETE 08/14/2020  Narrative ECHOCARDIOGRAM REPORT    Patient Name:   MONTRELL LUSIGNAN Date of Exam: 08/14/2020 Medical Rec #:  [id number]       Height:       69.0 in Accession #:    [id number]      Weight:       176.6 lb Date of Birth:  1945/12/26        BSA:          1.960 m Patient Age:    75 years        BP:           162/98 mmHg Patient Gender: M               HR:           61 bpm. Exam Location:  [address]  Procedure: 2D Echo, Cardiac Doppler and Color Doppler  Indications:    R06.00  Dyspnea  History:        Patient has no prior history of Echocardiogram examinations. Risk Factors:Hypertension and Diabetes. Obstructive sleep apnea. Shortness of breath.  Sonographer:  NaTashia Rodgers-Jones RDCS Referring Phys: [id number] CHRISTOPHER L SCHUMANN  IMPRESSIONS   1. Left ventricular ejection fraction, by estimation, is 50 to 55%. The left ventricle has low normal function. The left ventricle has no regional wall motion abnormalities. Left ventricular diastolic parameters are consistent with Grade I diastolic dysfunction (impaired relaxation). 2. Right ventricular systolic function is normal. The right ventricular size is normal. 3. The mitral valve is normal in structure. Trivial mitral valve regurgitation. No evidence of mitral stenosis. 4. The aortic valve is tricuspid. Aortic valve regurgitation is trivial. Mild aortic valve sclerosis is present, with no evidence of aortic valve stenosis. 5. Aortic dilatation noted. There is mild dilatation of the ascending aorta, measuring 41 mm.  FINDINGS Left Ventricle: Left ventricular ejection fraction, by estimation, is 50 to 55%. The left ventricle has low normal function. The left ventricle has no regional wall motion abnormalities. The left ventricular internal cavity size was normal in size. There is no left ventricular hypertrophy. Left ventricular diastolic parameters are consistent with Grade I diastolic dysfunction (impaired relaxation).  Right Ventricle: The right ventricular size is normal. Right ventricular systolic function is normal.  Left Atrium: Left atrial size was normal in size.  Right Atrium: Right atrial size was normal in size.  Pericardium: There is no evidence of pericardial effusion.  Mitral Valve: The mitral valve is normal in structure. Trivial mitral valve regurgitation. No evidence of mitral valve stenosis.  Tricuspid Valve: The tricuspid valve is normal in structure. Tricuspid valve regurgitation  is mild . No evidence of tricuspid stenosis.  Aortic Valve: The aortic valve is tricuspid. Aortic valve regurgitation is trivial. Mild aortic valve sclerosis is present, with no evidence of aortic valve stenosis.  Pulmonic Valve: The pulmonic valve was normal in structure. Pulmonic valve regurgitation is trivial. No evidence of pulmonic stenosis.  Aorta: Aortic dilatation noted. There is mild dilatation of the ascending aorta, measuring 41 mm.  Venous: The inferior vena cava was not well visualized.  IAS/Shunts: The interatrial septum appears to be lipomatous. No atrial level shunt detected by color flow Doppler.   LEFT VENTRICLE PLAX 2D LVIDd:         4.10 cm  Diastology LVIDs:         2.90 cm  LV e' medial:    6.42 cm/s LV PW:         0.90 cm  LV E/e' medial:  10.5 LV IVS:        0.90 cm  LV e' lateral:   6.31 cm/s LVOT diam:     1.90 cm  LV E/e' lateral: 10.7 LV SV:         66 LV SV Index:   33 LVOT Area:     2.84 cm   RIGHT VENTRICLE RV Basal diam:  4.60 cm RV S prime:     13.40 cm/s TAPSE (M-mode): 2.1 cm  LEFT ATRIUM             Index       RIGHT ATRIUM           Index LA diam:        4.00 cm 2.04 cm/m  RA Area:     14.20 cm LA Vol (A2C):   53.8 ml 27.45 ml/m RA Volume:   39.40 ml  20.11 ml/m LA Vol (A4C):   41.9 ml 21.38 ml/m LA Biplane Vol: 48.4 ml 24.70 ml/m AORTIC VALVE LVOT Vmax:   111.50 cm/s LVOT Vmean:  71.700 cm/s LVOT VTI:  0.232 m  AORTA Ao Root diam: 3.80 cm Ao Asc diam:  4.30 cm  MITRAL VALVE               TRICUSPID VALVE MV Area (PHT): 2.91 cm    TR Peak grad:   24.6 mmHg MV Decel Time: 261 msec    TR Vmax:        248.00 cm/s MV E velocity: 67.70 cm/s MV A velocity: 93.80 cm/s  SHUNTS MV E/A ratio:  0.72        Systemic VTI:  0.23 m Systemic Diam: 1.90 cm  Olga Millers MD Electronically signed by Olga Millers MD Signature Date/Time: 08/14/2020/12:42:43 PM    Final    MONITORS  LONG TERM MONITOR (3-14 DAYS)  04/25/2022  Narrative Patch Wear Time:  13 days and 20 hours (2024-03-25T11:38:49-0400 to 2024-04-08T08:00:11-398)  Patient had a min HR of 22 bpm, max HR of 143 bpm, and avg HR of 66 bpm. Predominant underlying rhythm was Sinus Rhythm.  Nighttime sinus bradycardia rate of 40 to 45 bpm was noted.  There were 51 triggered events all sinus rhythm frequently associated with PVCs more than APCs and rarely with frequent PVCs.  12 Supraventricular Tachycardia runs occurred, the run with the fastest interval lasting 4 beats with a max rate of 143 bpm, the longest lasting 14 beats with an avg rate of 95 bpm.  Nocturnal second Degree AV Block-Mobitz I (Wenckebach) was present.  There were no pauses of 3 seconds or greater and no episodes of higher degree AV block.  Isolated SVEs were rare (<1.0%), SVE Couplets were rare (<1.0%), and SVE Triplets were rare (<1.0%).  There were no episodes of atrial fibrillation or flutter.  Isolated VEs were rare (<1.0%), VE Couplets were rare (<1.0%), and no VE Triplets were present. Ventricular Trigeminy was present.  There is 1 7 beat run of PVCs noted at a slow rate.   CT SCANS  CT CORONARY MORPH W/CTA COR W/SCORE 08/15/2020  Addendum 08/15/2020  2:34 PM ADDENDUM REPORT: 08/15/2020 14:32  HISTORY: Chest pain, nonspecific  EXAM: Cardiac/Coronary  CT  TECHNIQUE: The patient was scanned on a [employer].  PROTOCOL: A 120 kV prospective scan was triggered in the descending thoracic aorta at 111 HU's. Axial non-contrast 3 mm slices were carried out through the heart. The data set was analyzed on a dedicated work station and scored using the Agatston method. Gantry rotation speed was 250 msecs and collimation was .6 mm. Beta blockade and 0.8 mg of sl NTG was given. The 3D data set was reconstructed in 5% intervals of the 35-75 % of the R-R cycle. Systolic and diastolic phases were analyzed on a dedicated work station using MPR, MIP and VRT  modes. The patient received OMNIPAQUE IOHEXOL 350 MG/ML SOLN contrast.  FINDINGS: Image quality: Good.  Noise artifact is: Mild misregistration.  Coronary calcium score is 0.  Coronary arteries: Normal coronary origins.  Right dominance.  Right Coronary Artery: Minimal atherosclerotic plaque in the mid RCA, <25% stenosis.  Left Main Coronary Artery: No detectable plaque or stenosis.  Left Anterior Descending Coronary Artery: Probable total occlusion of the mid LAD just beyond the branch point of the first septal perforator and first diagonal artery, 100% stenosis with atherosclerotic plaque. Possible channel of flow with perfusion of distal LAD vs collateral flow from left to left collaterals.  Left Circumflex Artery: Minimal atherosclerotic plaque in the proximal L circumflex, <25% stenosis.  Aorta:  Measurements made in double  oblique technique:  Sinus of Valsalva (sinus to sinus):  R-L: 40 mm  L-Non: 40 mm  R-Non: 38 mm  ST junction: 32 mm  Mid ascending aorta (at PA bifurcation): 38 mm  Mid descending aorta (at level of pulmonary veins): 27 mm  Distal descending aorta (just proximal to aortic hiatus): 25 mm  Aortic Valve: No calcifications.  Other findings:  Normal pulmonary vein drainage into the left atrium.  Normal left atrial appendage without a thrombus.  Normal size of the pulmonary artery.  IMPRESSION: 1. Severe CAD with probable total occlusion of the mid LAD, CADRADS = 5.  2. Coronary calcium score of 0.  3. Normal coronary origin with right dominance.  4.  Mild ascending aorta dilation, 40 mm at sinus of Valsalva  RECOMMENDATIONS Consider cardiac catheterization for mid LAD occlusion.   Electronically Signed By: Weston Brass M.D. On: 08/15/2020 14:32  Narrative EXAM: OVER-READ INTERPRETATION  CT CHEST  The following report is an over-read performed by radiologist Dr. Irish Lack of [facility] on  08/15/2020. This over-read does not include interpretation of cardiac or coronary anatomy or pathology. The coronary CTA interpretation by the cardiologist is attached.  COMPARISON:  CT of the chest on 03/05/2011  FINDINGS: Vascular: No significant noncardiac vascular findings.  Mediastinum/Nodes: Visualized mediastinum and hilar regions demonstrate no lymphadenopathy or masses. Small hiatal hernia present.  Lungs/Pleura: Visualized lungs show no evidence of pulmonary edema, consolidation, pneumothorax, nodule or pleural fluid.  Upper Abdomen: No acute abnormality.  Musculoskeletal: Rounded area of sclerosis measuring up to 18 mm is identified in the T8 vertebral body just to the right of midline and was not definitely present on the 2013 chest CT. This is not associated with fracture. No other sclerotic or lytic bone lesions identified in visualized bony structures.  IMPRESSION: 1. Rounded area of sclerosis measuring roughly 1.8 cm in greatest diameter within the T8 vertebral body which was not present on a 2013 chest CT. The patient does have a history of prior prostate carcinoma and this may represent a focus of metastatic disease. It is unclear whether this is active metastatic disease and correlation suggested initially with PSA level. Referral to the patient's urologist is recommended. 2. Small hiatal hernia.  Electronically Signed: By: Irish Lack M.D. On: 08/15/2020 12:30              Recent Labs: 12/15/2021: ALT 24; BUN 15; Creatinine, Ser 0.92; Hemoglobin 12.4; Platelets 215; Potassium 4.9; Sodium 138  Recent Lipid Panel 05/14/2022 cholesterol 141 LDL 76  Physical Exam:    VS:  BP (!) 158/82 (BP Location: Right Arm, Patient Position: Sitting)   Pulse 84   Ht 5\' 7"  (1.702 m)   Wt 180 lb 9.6 oz (81.9 kg)   SpO2 96%   BMI 28.29 kg/m     Wt Readings from Last 3 Encounters:  07/02/22 180 lb 9.6 oz (81.9 kg)  03/29/22 184 lb (83.5 kg)  02/18/22 183  lb (83 kg)     GEN:  Well nourished, well developed in no acute distress HEENT: Normal NECK: No JVD; No carotid bruits LYMPHATICS: No lymphadenopathy CARDIAC: RRR, no murmurs, rubs, gallops RESPIRATORY:  Clear to auscultation without rales, wheezing or rhonchi  ABDOMEN: Soft, non-tender, non-distended MUSCULOSKELETAL:  No edema; No deformity  SKIN: Warm and dry NEUROLOGIC:  Alert and oriented x 3 PSYCHIATRIC:  Normal affect    Signed, Norman Herrlich, MD  07/02/2022 9:47 AM    [facility]

## 2022-07-02 ENCOUNTER — Ambulatory Visit: Payer: PPO | Attending: Cardiology | Admitting: Cardiology

## 2022-07-02 ENCOUNTER — Encounter: Payer: Self-pay | Admitting: Cardiology

## 2022-07-02 VITALS — BP 158/82 | HR 84 | Ht 67.0 in | Wt 180.6 lb

## 2022-07-02 DIAGNOSIS — I7789 Other specified disorders of arteries and arterioles: Secondary | ICD-10-CM

## 2022-07-02 DIAGNOSIS — I10 Essential (primary) hypertension: Secondary | ICD-10-CM | POA: Diagnosis not present

## 2022-07-02 DIAGNOSIS — I2510 Atherosclerotic heart disease of native coronary artery without angina pectoris: Secondary | ICD-10-CM | POA: Diagnosis not present

## 2022-07-02 DIAGNOSIS — E785 Hyperlipidemia, unspecified: Secondary | ICD-10-CM

## 2022-07-02 DIAGNOSIS — G7000 Myasthenia gravis without (acute) exacerbation: Secondary | ICD-10-CM | POA: Diagnosis not present

## 2022-07-02 MED ORDER — AMLODIPINE BESYLATE 5 MG PO TABS: 5.0000 mg | ORAL_TABLET | ORAL | 3 refills | Status: DC

## 2022-07-02 NOTE — Patient Instructions (Addendum)
Medication Instructions:  Your physician has recommended you make the following change in your medication:   START: Amlodipine 5 mg every other day STOP: Rosuvastatin STOP: Imdur  *If you need a refill on your cardiac medications before your next appointment, please call your pharmacy*   Lab Work: None If you have labs (blood work) drawn today and your tests are completely normal, you will receive your results only by: MyChart Message (if you have MyChart) OR A paper copy in the mail If you have any lab test that is abnormal or we need to change your treatment, we will call you to review the results.   Testing/Procedures: Non-Cardiac CT scanning, (CAT scanning), is a noninvasive, special x-ray that produces cross-sectional images of the body using x-rays and a computer. CT scans help physicians diagnose and treat medical conditions. For some CT exams, a contrast material is used to enhance visibility in the area of the body being studied. CT scans provide greater clarity and reveal more details than regular x-ray exams.    Follow-Up: At [facility], you and your health needs are our priority.  As part of our continuing mission to provide you with exceptional heart care, we have created designated Provider Care Teams.  These Care Teams include your primary Cardiologist (physician) and Advanced Practice Providers (APPs -  Physician Assistants and Nurse Practitioners) who all work together to provide you with the care you need, when you need it.  We recommend signing up for the patient portal called "MyChart".  Sign up information is provided on this After Visit Summary.  MyChart is used to connect with patients for Virtual Visits (Telemedicine).  Patients are able to view lab/test results, encounter notes, upcoming appointments, etc.  Non-urgent messages can be sent to your provider as well.   To learn more about what you can do with MyChart, go to [email or web address].    Your  next appointment:   6 month(s)  Provider:   Norman Herrlich, MD    Other Instructions Check blood pressures daily at midday and bring a list to the office in 4 weeks.  Stop taking your statin medication and let Dr. Dulce Sellar know if you feel better in 4 weeks.

## 2022-08-02 ENCOUNTER — Encounter: Payer: Self-pay | Admitting: Neurology

## 2022-08-02 ENCOUNTER — Ambulatory Visit: Payer: PPO | Admitting: Neurology

## 2022-08-02 VITALS — BP 131/72 | HR 56 | Ht 67.0 in | Wt 181.5 lb

## 2022-08-02 DIAGNOSIS — G7000 Myasthenia gravis without (acute) exacerbation: Secondary | ICD-10-CM

## 2022-08-02 MED ORDER — PYRIDOSTIGMINE BROMIDE 60 MG PO TABS: 30.0000 mg | ORAL_TABLET | Freq: Three times a day (TID) | ORAL | 3 refills | Status: DC

## 2022-08-02 MED ORDER — AZATHIOPRINE 50 MG PO TABS: 75.0000 mg | ORAL_TABLET | Freq: Two times a day (BID) | ORAL | 1 refills | Status: DC

## 2022-08-02 NOTE — Progress Notes (Signed)
Patient: Mario Burns Date of Birth: [birth year]  Reason for Visit: Follow up History from: Patient, wife Primary Neurologist: Mario Burns  ASSESSMENT AND PLAN 77 y.o. year old male   1.  Myasthenia gravis -Continues to report bulbar weakness, generalized weakness.  Generalized weakness could be multifactorial (DM, A1C 8's, aging, orthopedic wear-and-tear) -Will try slight increase of Imuran 75 mg twice daily (currently taking 50 mg twice daily), reports 100 mg twice daily had headache -Continue Mestinon 60 mg 1/2 tablet up to 3 times daily  -Check CBC, CMP -Follow-up in 6 months with Dr. Terrace Burns for [facility] recheck  HISTORY  Mario Burns is a 77 year old male, accompanied by his wife to follow-up seropositive generalized myasthenia gravis, he is accompanied by his wife at today's visit on January 08, 2021, his primary care is PA Mario Burns   I reviewed and summarized the referring note. PMHX HTN DM HLD CAD Thyroidectomy, on supplement History of prostate cancer, s/p proctectomy.   He was diagnosed with myasthenia gravis in 2008, presented with severe dysarthria, chewing swallowing difficulty, at is worse, he take more than 1 hour to finish a hamburger, he has been managed by Dr. Anne Hahn over the years, was treated with prednisone, Mestinon, and Imuran, for couple years, then his symptoms has improved so much, he stopped medication and  follow-up.  In summer of 2019, he began to experience weakness in his legs, problems chewing, swallowing, fatigue during the male, he was started on prednisone 20 mg daily, Mestinon 30 mg 3 times a day, CellCept 500 mg twice daily in October 2019, he only took 1 CellCept complains of GI side effect, stopped taking medications, could not tolerate more than 30 mg twice a day of Mestinon due to diarrhea,  His symptoms again improved with treatment, had a second round of steroid, Decadron 2 mg daily at the end of 2019, was placed on Imuran 50 mg twice a day, also  investigate the possibility of IVIG treatment, because of the high co-pay, he could not afford it  Over the last couple years, he continues to deal with variable degree of chewing difficulty, swallowing difficulty, mild slurred speech, fatigue, gait abnormality, his multiple joints pain also contributed  He is currently taking Imuran 50 mg twice a day, previous attempt of 50/100, he complains of GI side effect with 100 mg, Mestinon 30 mg twice a day, He was also recently diagnosed with coronary artery disease, he complains of difficulty swallowing, especially tough meat, doing well with mashed potato, even sandwiches, egg cause difficulty sometimes, he was diagnosed with obstructive sleep apnea more than 20 years ago, could not tolerate CPAP machine, has been sleeping in his recliner seems, also complains of frequent urination due to his prostate issues   CT chest in 2013, showed no evidence of thymus pathology, 5 cm hiatal hernia, previous total thyroidectomy   UPDATE June 08 2021: He continued on Imuran 50 mg twice a day instead of the intended 100 mg twice a day, worry about the GI side effect, stayed on Mestinon 60 mg half tablet twice a day,  He continues to have intermittent chewing and swallowing difficulties, has to chew food many times before attempts swallowing, generalized weakness, much less active  Laboratory evaluation showed suboptimal control of diabetes, A1c 8.8, previously 10.3, acetylcholine binding antibody 54.7, blocking antibody 56   Update December 15, 2021 SS: MG is about the same, continues with intermittent chewing trouble, has to over chew solid foods, takes his time.  No choking. No significant muscle weakness other than aging. No falls. Last A1C was 8.2, trying to do better with diet. No new problems or concerns.  Remains on Imuran 50 mg twice a day, Mestinon 30 mg twice daily. Here with his wife.   Update August 02, 2022 SS: has to be careful with chewing, swallowing, has  to take his time. His hip hurts, knees hurt, walking far distance wears him out, poor balance. Tinkers in work shop all day. He doesn't have energy to do much. On Mestinon 30 mg BID, forgets to take TID. On Imuran 50 mg BID. A1C 8.4. saw cardiology, stopped statin to see if contributing to muscle weakness, hasn't seen any change.   REVIEW OF SYSTEMS: Out of a complete 14 system review of symptoms, the patient complains only of the following symptoms, and all other reviewed systems are negative.  See HPI  ALLERGIES: Allergies  Allergen Reactions   Bydureon [Exenatide] Nausea Only    Headache & constipation   Colestid [Colestipol Hcl] Other (See Comments)    Bloating and constipation   Hydrochlorothiazide Other (See Comments)    Headache   Invokana [Canagliflozin] Other (See Comments)    Polyuria   Januvia [Sitagliptin] Other (See Comments)    Raised blood sugar   Levemir [Insulin Detemir] Other (See Comments)    Raised blood sugar   Lisinopril Other (See Comments)    Headache   Lovastatin Other (See Comments)    Blood in stool   Metformin And Related Nausea Only    Headache   Pioglitazone Other (See Comments)    Blood in stool   Pravastatin Sodium Other (See Comments)    Sore throat and Headache, felt like he couldn't breathe   Sulfa Antibiotics Other (See Comments)    unknown  Other Reaction(s): stomach upset   Toujeo Max Solostar [Insulin Glargine] Itching    Headaches and chest pain   Victoza [Liraglutide] Nausea Only    Headache and constipation    HOME MEDICATIONS: Outpatient Medications Prior to Visit  Medication Sig Dispense Refill   acetaminophen (TYLENOL) 500 MG tablet Take 500 mg by mouth every 6 (six) hours as needed for mild pain or moderate pain.     amLODipine (NORVASC) 5 MG tablet Take 1 tablet (5 mg total) by mouth every other day. (Patient taking differently: Take 5 mg by mouth daily.) 45 tablet 3   aspirin EC 81 MG tablet Take 1 tablet (81 mg total) by  mouth daily. Swallow whole. 90 tablet 3   azaTHIOprine (IMURAN) 50 MG tablet Take 1 tablet (50 mg total) by mouth 2 (two) times daily. 90 tablet 1   glimepiride (AMARYL) 4 MG tablet Take 4 mg by mouth 2 (two) times daily.     levothyroxine (SYNTHROID) 175 MCG tablet Take 175 mcg by mouth daily before breakfast.     losartan (COZAAR) 100 MG tablet Take 100 mg by mouth daily.     naproxen sodium (ALEVE) 220 MG tablet Take 220 mg by mouth daily.     omeprazole (PRILOSEC) 20 MG capsule Take 20 mg by mouth every evening.      pyridostigmine (MESTINON) 60 MG tablet Take 0.5 tablets (30 mg total) by mouth 3 (three) times daily. 135 tablet 3   TOUJEO MAX SOLOSTAR 300 UNIT/ML SOPN Inject 18 Units into the skin at bedtime.     nitroGLYCERIN (NITROSTAT) 0.4 MG SL tablet Place 1 tablet (0.4 mg total) under the tongue every 5 (five) minutes as  needed. (Patient not taking: Reported on 08/02/2022) 25 tablet 3   insulin lispro (HUMALOG KWIKPEN) 100 UNIT/ML KwikPen Inject 5 mLs into the skin 2 (two) times daily with a meal.     OVER THE COUNTER MEDICATION Take 1 tablet by mouth daily. Total Beets Supplement     No facility-administered medications prior to visit.    PAST MEDICAL HISTORY: Past Medical History:  Diagnosis Date   Abnormal cardiac CT angiography    Acute prostatitis 03/03/2022   Adverse reaction to statin medication    Aortic atherosclerosis (HCC)    Arthralgia of multiple joints 03/03/2022   Arthritis    B12 deficiency 03/03/2022   Benign enlargement of prostate 03/03/2022   Bladder neck contracture    Borderline hyperlipidemia    Breathlessness on exertion 03/03/2022   CA of prostate (HCC) 03/03/2022   urologist Dr. Isabel Caprice, s/p radical prostatectomy   CAD (coronary artery disease)    Chronic right-sided low back pain with right-sided sciatica 04/26/2016   Claustrophobia    Complication of anesthesia    CLAUSTROPHOBIC   Diabetes mellitus (HCC) 12/16/2011   Dizziness 03/03/2022   ED  (erectile dysfunction) of organic origin 03/03/2022   Esophageal reflux 03/03/2022   Essential hypertension, benign 06/26/2014   GERD (gastroesophageal reflux disease)    H/O hiatal hernia    History of thyroid nodule    LARGE GOITER   Hypercholesterolemia    Hyperlipidemia 06/26/2014   Hypothyroidism, postsurgical    Incisional hernia with obstruction 04/23/2013   Incisional hernia, without obstruction or gangrene 01/30/2013   MG (myasthenia gravis) (HCC)    DX 2013 --  NEUROLOGIST--  DR WILLIS/  TREATED W/ MEDICATION UNTIL SYMPTOMS RESOLVED -- NO REMISSION--  SEES DR WILLIS PRN   Muscle spasms of neck 03/03/2022   Obstructive sleep apnea 03/03/2022   intolerant of CPAP   Shortness of breath    ALWAYS--HAD FOR YEARS - STATES HE CAN'T DO HARDLY ANYTHING WITHOUT GIVING OUT   Skin lesion 03/03/2022   Type 2 diabetes mellitus (HCC)    Weak urinary stream     PAST SURGICAL HISTORY: Past Surgical History:  Procedure Laterality Date   CATARACT EXTRACTION W/ INTRAOCULAR LENS IMPLANT Right 11/14/2018   CYSTOSCOPY N/A 07/30/2013   Procedure: CYSTOSCOPY WITH Eustaquio Boyden ;  Surgeon: Valetta Fuller, MD;  Location: [facility];  Service: Urology;  Laterality: N/A;   CYSTOSCOPY WITH URETHRAL DILATATION N/A 08/02/2012   Procedure: CYSTOSCOPY WITH URETHRAL DILATATION, AN REMOVAL STAPLE FROM BLADDER NECK;  Surgeon: Valetta Fuller, MD;  Location: [facility];  Service: Urology;  Laterality: N/A;   HEMORRHOID SURGERY  09/04/1988   INCISIONAL HERNIA REPAIR N/A 04/23/2013   Procedure: LAPAROSCOPIC INCISIONAL HERNIA;  Surgeon: Ernestene Mention, MD;  Location: [facility];  Service: General;  Laterality: N/A;   INSERTION OF MESH N/A 04/23/2013   Procedure: INSERTION OF MESH;  Surgeon: Ernestene Mention, MD;  Location: [facility];  Service: General;  Laterality: N/A;   LEFT HEART CATH AND CORONARY ANGIOGRAPHY N/A 08/26/2020   Procedure: LEFT HEART CATH AND CORONARY ANGIOGRAPHY;   Surgeon: Corky Crafts, MD;  Location[facility] INVASIVE CV LAB;  Service: Cardiovascular;  Laterality: N/A;   ROBOT ASSISTED LAPAROSCOPIC RADICAL PROSTATECTOMY  12/15/2011   Procedure: ROBOTIC ASSISTED LAPAROSCOPIC RADICAL PROSTATECTOMY;  Surgeon: Valetta Fuller, MD;  Location: [facility];  Service: Urology;  Laterality: N/A;   TOTAL THYROIDECTOMY  05/24/2003    FAMILY HISTORY: Family History  Problem  Relation Age of Onset   Heart disease Mother    Glaucoma Mother    Hypertension Mother    Colon cancer Father    Diabetes Sister    Diabetes Brother    Diabetes Brother    Cancer Brother     SOCIAL HISTORY: Social History   Socioeconomic History   Marital status: Married    Spouse name: Doris   Number of children: 1   Years of education: 10th   Highest education level: Not on file  Occupational History   Occupation: semi-retired  Tobacco Use   Smoking status: Never   Smokeless tobacco: Never  Vaping Use   Vaping status: Never Used  Substance and Sexual Activity   Alcohol use: No   Drug use: No   Sexual activity: Not on file  Other Topics Concern   Not on file  Social History Narrative   Lives at home with his wife.   Right-handed.   2-3 cups caffeine per day.   Social Determinants of Health   Financial Resource Strain: Not on file  Food Insecurity: Not on file  Transportation Needs: Not on file  Physical Activity: Not on file  Stress: Not on file  Social Connections: Not on file  Intimate Partner Violence: Not on file    PHYSICAL EXAM  Vitals:   08/02/22 0904  BP: 131/72  Pulse: (!) 56  Weight: 181 lb 8 oz (82.3 kg)  Height: 5\' 7"  (1.702 m)    Body mass index is 28.43 kg/m.  Generalized: Well developed, in no acute distress  Neurological examination  Mentation: Alert oriented to time, place, history taking. Follows all commands speech and language fluent Cranial nerve II-XII: Pupils were equal round reactive to light. Extraocular movements were  full, visual field were full on confrontational test. Facial sensation and strength were normal.  Head turning and shoulder shrug  were normal and symmetric.  Mild-moderate cheek puff and eye open weakness noted. Motor:  4/5 left arm due to shoulder weakness, 4/5 bilateral hip flexion weakness, left knee pain Sensory: Sensory testing is intact to soft touch on all 4 extremities. No evidence of extinction is noted.  Coordination: Cerebellar testing reveals good finger-nose-finger and heel-to-shin bilaterally.  Gait and station: Not able to push off from seated position with arms crossed, gait is slightly wide-based, but steady and independent Reflexes: Deep tendon reflexes are symmetric and normal bilaterally.   DIAGNOSTIC DATA (LABS, IMAGING, TESTING) - I reviewed patient records, labs, notes, testing and imaging myself where available.  Lab Results  Component Value Date   WBC 5.5 12/15/2021   HGB 12.4 (L) 12/15/2021   HCT 36.5 (L) 12/15/2021   MCV 93 12/15/2021   PLT 215 12/15/2021      Component Value Date/Time   NA 138 12/15/2021 0929   K 4.9 12/15/2021 0929   CL 104 12/15/2021 0929   CO2 23 12/15/2021 0929   GLUCOSE 209 (H) 12/15/2021 0929   GLUCOSE 135 (H) 09/03/2020 1426   BUN 15 12/15/2021 0929   CREATININE 0.92 12/15/2021 0929   CREATININE 0.86 09/03/2020 1426   CALCIUM 9.0 12/15/2021 0929   PROT 6.0 12/15/2021 0929   ALBUMIN 3.6 (L) 12/15/2021 0929   AST 25 12/15/2021 0929   AST 23 09/03/2020 1426   ALT 24 12/15/2021 0929   ALT 32 09/03/2020 1426   ALKPHOS 78 12/15/2021 0929   BILITOT 0.8 12/15/2021 0929   BILITOT 0.9 09/03/2020 1426   GFRNONAA >60 09/03/2020 1426  GFRAA 102 11/26/2019 0916   No results found for: "CHOL", "HDL", "LDLCALC", "LDLDIRECT", "TRIG", "CHOLHDL" Lab Results  Component Value Date   HGBA1C 8.8 (H) 01/08/2021   No results found for: "VITAMINB12" Lab Results  Component Value Date   TSH 0.747 09/03/2020     Margie Ege, AGNP-C, DNP  08/02/2022, 9:42 AM [facility] [address], [address] [place], [place] [address] [phone]

## 2022-08-02 NOTE — Patient Instructions (Signed)
Increase Imuran 75 mg twice daily, monitor for any side effect Continue Mestinon up to 3 times daily as needed Checks labs today  Follow up in 6 months

## 2022-08-25 DIAGNOSIS — I2510 Atherosclerotic heart disease of native coronary artery without angina pectoris: Secondary | ICD-10-CM | POA: Diagnosis not present

## 2022-08-25 DIAGNOSIS — G7000 Myasthenia gravis without (acute) exacerbation: Secondary | ICD-10-CM | POA: Diagnosis not present

## 2022-08-25 DIAGNOSIS — I700 Atherosclerosis of aorta: Secondary | ICD-10-CM | POA: Diagnosis not present

## 2022-08-25 DIAGNOSIS — E890 Postprocedural hypothyroidism: Secondary | ICD-10-CM | POA: Diagnosis not present

## 2022-08-25 DIAGNOSIS — E7800 Pure hypercholesterolemia, unspecified: Secondary | ICD-10-CM | POA: Diagnosis not present

## 2022-08-25 DIAGNOSIS — E1149 Type 2 diabetes mellitus with other diabetic neurological complication: Secondary | ICD-10-CM | POA: Diagnosis not present

## 2022-08-25 DIAGNOSIS — I10 Essential (primary) hypertension: Secondary | ICD-10-CM | POA: Diagnosis not present

## 2022-09-29 DIAGNOSIS — E113293 Type 2 diabetes mellitus with mild nonproliferative diabetic retinopathy without macular edema, bilateral: Secondary | ICD-10-CM | POA: Diagnosis not present

## 2022-09-29 DIAGNOSIS — H26493 Other secondary cataract, bilateral: Secondary | ICD-10-CM | POA: Diagnosis not present

## 2022-10-14 DIAGNOSIS — S0181XA Laceration without foreign body of other part of head, initial encounter: Secondary | ICD-10-CM | POA: Diagnosis not present

## 2022-10-14 DIAGNOSIS — S022XXA Fracture of nasal bones, initial encounter for closed fracture: Secondary | ICD-10-CM | POA: Diagnosis not present

## 2022-10-14 DIAGNOSIS — R609 Edema, unspecified: Secondary | ICD-10-CM | POA: Diagnosis not present

## 2022-10-14 DIAGNOSIS — S199XXA Unspecified injury of neck, initial encounter: Secondary | ICD-10-CM | POA: Diagnosis not present

## 2022-10-14 DIAGNOSIS — I10 Essential (primary) hypertension: Secondary | ICD-10-CM | POA: Diagnosis not present

## 2022-10-14 DIAGNOSIS — S022XXB Fracture of nasal bones, initial encounter for open fracture: Secondary | ICD-10-CM | POA: Diagnosis not present

## 2022-10-14 DIAGNOSIS — Z043 Encounter for examination and observation following other accident: Secondary | ICD-10-CM | POA: Diagnosis not present

## 2022-10-14 DIAGNOSIS — S0003XA Contusion of scalp, initial encounter: Secondary | ICD-10-CM | POA: Diagnosis not present

## 2022-10-14 DIAGNOSIS — R58 Hemorrhage, not elsewhere classified: Secondary | ICD-10-CM | POA: Diagnosis not present

## 2022-10-14 DIAGNOSIS — J342 Deviated nasal septum: Secondary | ICD-10-CM | POA: Diagnosis not present

## 2022-10-14 DIAGNOSIS — S06330A Contusion and laceration of cerebrum, unspecified, without loss of consciousness, initial encounter: Secondary | ICD-10-CM | POA: Diagnosis not present

## 2022-10-14 DIAGNOSIS — I672 Cerebral atherosclerosis: Secondary | ICD-10-CM | POA: Diagnosis not present

## 2022-10-14 DIAGNOSIS — Z23 Encounter for immunization: Secondary | ICD-10-CM | POA: Diagnosis not present

## 2022-10-14 DIAGNOSIS — W19XXXA Unspecified fall, initial encounter: Secondary | ICD-10-CM | POA: Diagnosis not present

## 2022-10-25 DIAGNOSIS — S022XXA Fracture of nasal bones, initial encounter for closed fracture: Secondary | ICD-10-CM | POA: Diagnosis not present

## 2022-10-25 DIAGNOSIS — M47812 Spondylosis without myelopathy or radiculopathy, cervical region: Secondary | ICD-10-CM | POA: Diagnosis not present

## 2022-10-25 DIAGNOSIS — J342 Deviated nasal septum: Secondary | ICD-10-CM | POA: Diagnosis not present

## 2022-10-25 DIAGNOSIS — S0990XA Unspecified injury of head, initial encounter: Secondary | ICD-10-CM | POA: Diagnosis not present

## 2022-10-25 DIAGNOSIS — S0191XA Laceration without foreign body of unspecified part of head, initial encounter: Secondary | ICD-10-CM | POA: Diagnosis not present

## 2022-10-25 DIAGNOSIS — H6121 Impacted cerumen, right ear: Secondary | ICD-10-CM | POA: Diagnosis not present

## 2022-11-05 DIAGNOSIS — R202 Paresthesia of skin: Secondary | ICD-10-CM | POA: Insufficient documentation

## 2022-11-05 HISTORY — DX: Paresthesia of skin: R20.2

## 2022-11-19 DIAGNOSIS — S61411A Laceration without foreign body of right hand, initial encounter: Secondary | ICD-10-CM | POA: Diagnosis not present

## 2022-11-23 DIAGNOSIS — S61411D Laceration without foreign body of right hand, subsequent encounter: Secondary | ICD-10-CM | POA: Diagnosis not present

## 2022-12-06 DIAGNOSIS — E890 Postprocedural hypothyroidism: Secondary | ICD-10-CM | POA: Diagnosis not present

## 2022-12-06 DIAGNOSIS — E7800 Pure hypercholesterolemia, unspecified: Secondary | ICD-10-CM | POA: Diagnosis not present

## 2022-12-06 DIAGNOSIS — E1149 Type 2 diabetes mellitus with other diabetic neurological complication: Secondary | ICD-10-CM | POA: Diagnosis not present

## 2022-12-06 DIAGNOSIS — I2510 Atherosclerotic heart disease of native coronary artery without angina pectoris: Secondary | ICD-10-CM | POA: Diagnosis not present

## 2022-12-08 DIAGNOSIS — I2510 Atherosclerotic heart disease of native coronary artery without angina pectoris: Secondary | ICD-10-CM | POA: Diagnosis not present

## 2022-12-08 DIAGNOSIS — E1149 Type 2 diabetes mellitus with other diabetic neurological complication: Secondary | ICD-10-CM | POA: Diagnosis not present

## 2022-12-08 DIAGNOSIS — Z9181 History of falling: Secondary | ICD-10-CM | POA: Diagnosis not present

## 2022-12-08 DIAGNOSIS — E890 Postprocedural hypothyroidism: Secondary | ICD-10-CM | POA: Diagnosis not present

## 2022-12-08 DIAGNOSIS — R809 Proteinuria, unspecified: Secondary | ICD-10-CM | POA: Diagnosis not present

## 2022-12-08 DIAGNOSIS — B3749 Other urogenital candidiasis: Secondary | ICD-10-CM | POA: Diagnosis not present

## 2022-12-08 DIAGNOSIS — I700 Atherosclerosis of aorta: Secondary | ICD-10-CM | POA: Diagnosis not present

## 2022-12-08 DIAGNOSIS — I10 Essential (primary) hypertension: Secondary | ICD-10-CM | POA: Diagnosis not present

## 2022-12-08 DIAGNOSIS — G7000 Myasthenia gravis without (acute) exacerbation: Secondary | ICD-10-CM | POA: Diagnosis not present

## 2022-12-08 DIAGNOSIS — E7800 Pure hypercholesterolemia, unspecified: Secondary | ICD-10-CM | POA: Diagnosis not present

## 2022-12-08 DIAGNOSIS — Z1331 Encounter for screening for depression: Secondary | ICD-10-CM | POA: Diagnosis not present

## 2022-12-22 DIAGNOSIS — G44309 Post-traumatic headache, unspecified, not intractable: Secondary | ICD-10-CM | POA: Diagnosis not present

## 2022-12-22 DIAGNOSIS — J019 Acute sinusitis, unspecified: Secondary | ICD-10-CM | POA: Diagnosis not present

## 2022-12-22 DIAGNOSIS — R6889 Other general symptoms and signs: Secondary | ICD-10-CM | POA: Diagnosis not present

## 2022-12-22 DIAGNOSIS — L03113 Cellulitis of right upper limb: Secondary | ICD-10-CM | POA: Diagnosis not present

## 2023-01-10 DIAGNOSIS — L03113 Cellulitis of right upper limb: Secondary | ICD-10-CM | POA: Diagnosis not present

## 2023-01-10 DIAGNOSIS — L089 Local infection of the skin and subcutaneous tissue, unspecified: Secondary | ICD-10-CM | POA: Diagnosis not present

## 2023-01-10 DIAGNOSIS — K219 Gastro-esophageal reflux disease without esophagitis: Secondary | ICD-10-CM | POA: Diagnosis not present

## 2023-01-10 DIAGNOSIS — T148XXA Other injury of unspecified body region, initial encounter: Secondary | ICD-10-CM | POA: Diagnosis not present

## 2023-01-31 ENCOUNTER — Ambulatory Visit: Payer: PPO | Admitting: Neurology

## 2023-01-31 ENCOUNTER — Encounter: Payer: Self-pay | Admitting: Neurology

## 2023-01-31 VITALS — BP 152/84 | HR 64 | Ht 70.0 in | Wt 189.0 lb

## 2023-01-31 DIAGNOSIS — G7000 Myasthenia gravis without (acute) exacerbation: Secondary | ICD-10-CM | POA: Diagnosis not present

## 2023-01-31 DIAGNOSIS — R531 Weakness: Secondary | ICD-10-CM

## 2023-01-31 MED ORDER — MYCOPHENOLATE MOFETIL 500 MG PO TABS: 1000.0000 mg | ORAL_TABLET | Freq: Two times a day (BID) | ORAL | 11 refills | Status: DC

## 2023-01-31 NOTE — Progress Notes (Signed)
Chief Complaint  Patient presents with   Myasthenia Gravis    Rm14, wife present, [id number] well no concerns    ASSESSMENT AND PLAN  Mario Burns is a 78 y.o. male   Long history of myasthenia gravis  Moderate bulbar weakness, mild to moderate limb muscle weakness on examination,  He complains of intermittent chewing swallowing difficulty, dysarthria,even SOB with exertion  He would benefit better control of his myasthenia gravis symptoms, but very hesitant with medications, for many years, despite multiple effort of changes, he remains on low-dose Imuran 50 mg bid, mestinon 30mg  bid,  Discussed with patient and his wife, agree to try CellCept 500 mg 2 tablets twice a day, will hold Imuran, also Mestinon to decrease the GI side effect return to clinic in 3 months   DIAGNOSTIC DATA (LABS, IMAGING, TESTING) - I reviewed patient records, labs, notes, testing and imaging myself where available.   MEDICAL HISTORY:  Mario Burns is a 78 year old male, accompanied by his wife to follow-up seropositive generalized myasthenia gravis, he is accompanied by his wife at today's visit on January 08, 2021, his primary care is PA Lonie Peak  I reviewed and summarized the referring note. PMHX HTN DM HLD CAD Thyroidectomy, on supplement History of prostate cancer, s/p proctectomy.  He was diagnosed with myasthenia gravis in 2008, presented with severe dysarthria, chewing swallowing difficulty, at is worse, he take more than 1 hour to finish a hamburger, he has been managed by Dr. Anne Hahn over the years, was treated with prednisone, Mestinon, and Imuran, for couple years, then his symptoms has improved so much, he stopped medication and  follow-up.  In summer of 2019, he began to experience weakness in his legs, problems chewing, swallowing, fatigue during the male, he was started on prednisone 20 mg daily, Mestinon 30 mg 3 times a day, CellCept 500 mg twice daily in October 2019, he only  took 1 CellCept complains of GI side effect, stopped taking medications, could not tolerate more than 30 mg twice a day of Mestinon due to diarrhea,  His symptoms again improved with treatment, had a second round of steroid, Decadron 2 mg daily at the end of 2019, was placed on Imuran 50 mg twice a day, also investigate the possibility of IVIG treatment, because of the high co-pay, he could not afford it  Over the last couple years, he continues to deal with variable degree of chewing difficulty, swallowing difficulty, mild slurred speech, fatigue, gait abnormality, his multiple joints pain also contributed  He is currently taking Imuran 50 mg twice a day, previous attempt of 50/100, he complains of GI side effect with 100 mg, Mestinon 30 mg twice a day, He was also recently diagnosed with coronary artery disease, he complains of difficulty swallowing, especially tough meat, doing well with mashed potato, even sandwiches, egg cause difficulty sometimes, he was diagnosed with obstructive sleep apnea more than 20 years ago, could not tolerate CPAP machine, has been sleeping in his recliner seems, also complains of frequent urination due to his prostate issues  CT chest in 2013, showed no evidence of thymus pathology, 5 cm hiatal hernia, previous total thyroidectomy  UPDATE June 08 2021: He continued on Imuran 50 mg twice a day instead of the intended 100 mg twice a day, worry about the GI side effect, stayed on Mestinon 60 mg half tablet twice a day,  He continues to have intermittent chewing and swallowing difficulties, has to chew food many times before attempts  swallowing, generalized weakness, much less active  Laboratory evaluation showed suboptimal control of diabetes, A1c 8.8, previously 10.3, acetylcholine binding antibody 54.7, blocking antibody 56  UPDATE Jan 27th 2025: Was seen by Huntley Dec over past few months, continue complains of bulbar weakness, trouble chewing meat, even hamburger,  difficulty swallowing, no longer active, continued on low-dose Imuran 50 mg twice daily, Mestinon 30 mg twice daily  Over the past couple years, we have multiple attempt to try to adjusting his medications, it is very difficult to carry through, tends to complain GI side effect with only few tries,  PHYSICAL EXAM:   Vitals:   01/31/23 0849  BP: (!) 152/84  Pulse: 64  Weight: 189 lb (85.7 kg)  Height: 5\' 10"  (1.778 m)   Body mass index is 27.12 kg/m.  PHYSICAL EXAMNIATION:  Gen: NAD, conversant, well nourised, well groomed                     Cardiovascular: Regular rate rhythm, no peripheral edema, warm, nontender. Eyes: Conjunctivae clear without exudates or hemorrhage Neck: Supple, no carotid bruits. Pulmonary: Clear to auscultation bilaterally   NEUROLOGICAL EXAM:  MENTAL STATUS: Speech/cognition: Mildly slurred thick speech CRANIAL NERVES: CN II: Visual fields are full to confrontation. Pupils are round equal and briskly reactive to light. CN III, IV, VI: No ptosis, extraocular movement were normal CN V: Facial sensation is intact to light touch CN VII: Moderate eye closure, cheek puff weakness, CN VIII: Hearing is normal to causal conversation. CN IX, X: Phonation is normal. CN XI: Head turning and shoulder shrug are intact  MOTOR: Mild neck flexion weakness, mild to moderate moderate bilateral shoulder abduction, external rotation weakness, left side is also limited due to left shoulder pain, mild bilateral hip flexion weakness  REFLEXES: Reflexes are 1  and symmetric at the biceps, triceps, knees, and ankles. Plantar responses are flexor.  SENSORY: Intact to light touch,    COORDINATION: There is no trunk or limb dysmetria noted.  GAIT/STANCE: He could not get up from seated position arm crossed, needing pushing on chair arm, multiple attempts, mildly antalgic, cautious,  REVIEW OF SYSTEMS:  Full 14 system review of systems performed and notable only for as  above All other review of systems were negative.   ALLERGIES: Allergies  Allergen Reactions   Bydureon [Exenatide] Nausea Only    Headache & constipation   Colestid [Colestipol Hcl] Other (See Comments)    Bloating and constipation   Hydrochlorothiazide Other (See Comments)    Headache   Invokana [Canagliflozin] Other (See Comments)    Polyuria   Januvia [Sitagliptin] Other (See Comments)    Raised blood sugar   Levemir [Insulin Detemir] Other (See Comments)    Raised blood sugar   Lisinopril Other (See Comments)    Headache   Lovastatin Other (See Comments)    Blood in stool   Metformin And Related Nausea Only    Headache   Pioglitazone Other (See Comments)    Blood in stool   Pravastatin Sodium Other (See Comments)    Sore throat and Headache, felt like he couldn't breathe   Sulfa Antibiotics Other (See Comments)    unknown  Other Reaction(s): stomach upset   Toujeo Max Solostar [Insulin Glargine] Itching    Headaches and chest pain   Victoza [Liraglutide] Nausea Only    Headache and constipation    HOME MEDICATIONS: Current Outpatient Medications  Medication Sig Dispense Refill   acetaminophen (TYLENOL) 500 MG tablet Take  500 mg by mouth every 6 (six) hours as needed for mild pain or moderate pain.     amLODipine (NORVASC) 5 MG tablet Take 1 tablet (5 mg total) by mouth every other day. (Patient taking differently: Take 5 mg by mouth daily.) 45 tablet 3   aspirin EC 81 MG tablet Take 1 tablet (81 mg total) by mouth daily. Swallow whole. 90 tablet 3   azaTHIOprine (IMURAN) 50 MG tablet Take 1.5 tablets (75 mg total) by mouth 2 (two) times daily. 270 tablet 1   glimepiride (AMARYL) 4 MG tablet Take 4 mg by mouth 2 (two) times daily.     levothyroxine (SYNTHROID) 175 MCG tablet Take 175 mcg by mouth daily before breakfast.     losartan (COZAAR) 100 MG tablet Take 100 mg by mouth daily.     naproxen sodium (ALEVE) 220 MG tablet Take 220 mg by mouth daily.      nitroGLYCERIN (NITROSTAT) 0.4 MG SL tablet Place 1 tablet (0.4 mg total) under the tongue every 5 (five) minutes as needed. 25 tablet 3   omeprazole (PRILOSEC) 20 MG capsule Take 20 mg by mouth every evening.      pyridostigmine (MESTINON) 60 MG tablet Take 0.5 tablets (30 mg total) by mouth 3 (three) times daily. 135 tablet 3   TOUJEO MAX SOLOSTAR 300 UNIT/ML SOPN Inject 18 Units into the skin at bedtime.     No current facility-administered medications for this visit.    PAST MEDICAL HISTORY: Past Medical History:  Diagnosis Date   Abnormal cardiac CT angiography    Acute prostatitis 03/03/2022   Adverse reaction to statin medication    Aortic atherosclerosis (HCC)    Arthralgia of multiple joints 03/03/2022   Arthritis    B12 deficiency 03/03/2022   Benign enlargement of prostate 03/03/2022   Bladder neck contracture    Borderline hyperlipidemia    Breathlessness on exertion 03/03/2022   CA of prostate (HCC) 03/03/2022   urologist Dr. Isabel Caprice, s/p radical prostatectomy   CAD (coronary artery disease)    Chronic right-sided low back pain with right-sided sciatica 04/26/2016   Claustrophobia    Complication of anesthesia    CLAUSTROPHOBIC   Diabetes mellitus (HCC) 12/16/2011   Dizziness 03/03/2022   ED (erectile dysfunction) of organic origin 03/03/2022   Esophageal reflux 03/03/2022   Essential hypertension, benign 06/26/2014   GERD (gastroesophageal reflux disease)    H/O hiatal hernia    History of thyroid nodule    LARGE GOITER   Hypercholesterolemia    Hyperlipidemia 06/26/2014   Hypothyroidism, postsurgical    Incisional hernia with obstruction 04/23/2013   Incisional hernia, without obstruction or gangrene 01/30/2013   MG (myasthenia gravis) (HCC)    DX 2013 --  NEUROLOGIST--  DR WILLIS/  TREATED W/ MEDICATION UNTIL SYMPTOMS RESOLVED -- NO REMISSION--  SEES DR WILLIS PRN   Muscle spasms of neck 03/03/2022   Obstructive sleep apnea 03/03/2022   intolerant of CPAP    Shortness of breath    ALWAYS--HAD FOR YEARS - STATES HE CAN'T DO HARDLY ANYTHING WITHOUT GIVING OUT   Skin lesion 03/03/2022   Type 2 diabetes mellitus (HCC)    Weak urinary stream     PAST SURGICAL HISTORY: Past Surgical History:  Procedure Laterality Date   CATARACT EXTRACTION W/ INTRAOCULAR LENS IMPLANT Right 11/14/2018   CYSTOSCOPY N/A 07/30/2013   Procedure: CYSTOSCOPY WITH Eustaquio Boyden ;  Surgeon: Valetta Fuller, MD;  [facility];  Service: Urology;  Laterality: N/A;   CYSTOSCOPY WITH URETHRAL DILATATION N/A 08/02/2012   Procedure: CYSTOSCOPY WITH URETHRAL DILATATION, AN REMOVAL STAPLE FROM BLADDER NECK;  Surgeon: Valetta Fuller, MD;  Location: [facility];  Service: Urology;  Laterality: N/A;   HEMORRHOID SURGERY  09/04/1988   INCISIONAL HERNIA REPAIR N/A 04/23/2013   Procedure: LAPAROSCOPIC INCISIONAL HERNIA;  Surgeon: Ernestene Mention, MD;  Location: [facility];  Service: General;  Laterality: N/A;   INSERTION OF MESH N/A 04/23/2013   Procedure: INSERTION OF MESH;  Surgeon: Ernestene Mention, MD;  Location: [facility];  Service: General;  Laterality: N/A;   LEFT HEART CATH AND CORONARY ANGIOGRAPHY N/A 08/26/2020   Procedure: LEFT HEART CATH AND CORONARY ANGIOGRAPHY;  Surgeon: Corky Crafts, MD;  Location: [facility] CV [facility];  Service: Cardiovascular;  Laterality: N/A;   ROBOT ASSISTED LAPAROSCOPIC RADICAL PROSTATECTOMY  12/15/2011   Procedure: ROBOTIC ASSISTED LAPAROSCOPIC RADICAL PROSTATECTOMY;  Surgeon: Valetta Fuller, MD;  Location: [facility];  Service: Urology;  Laterality: N/A;   TOTAL THYROIDECTOMY  05/24/2003    FAMILY HISTORY: Family History  Problem Relation Age of Onset   Heart disease Mother    Glaucoma Mother    Hypertension Mother    Colon cancer Father    Diabetes Sister    Diabetes Brother    Diabetes Brother    Cancer Brother     SOCIAL HISTORY: Social History   Socioeconomic History   Marital status:  Married    Spouse name: Doris   Number of children: 1   Years of education: 10th   Highest education level: Not on file  Occupational History   Occupation: semi-retired  Tobacco Use   Smoking status: Never   Smokeless tobacco: Never  Vaping Use   Vaping status: Never Used  Substance and Sexual Activity   Alcohol use: No   Drug use: No   Sexual activity: Not on file  Other Topics Concern   Not on file  Social History Narrative   Lives at home with his wife.   Right-handed.   2-3 cups caffeine per day.   Social Drivers of Corporate investment banker Strain: Not on file  Food Insecurity: Not on file  Transportation Needs: Not on file  Physical Activity: Not on file  Stress: Not on file  Social Connections: Not on file  Intimate Partner Violence: Not on file    Levert Feinstein, M.D. Ph.D.  [facility] [address], [address] [place], [place] [address] Ph: [phone] Fax: [phone]  CC:  Lonie Peak, PA-C [address] [place],  [place] [address]  Lonie Peak, PA-C

## 2023-02-22 DIAGNOSIS — Z139 Encounter for screening, unspecified: Secondary | ICD-10-CM | POA: Diagnosis not present

## 2023-02-22 DIAGNOSIS — R609 Edema, unspecified: Secondary | ICD-10-CM | POA: Diagnosis not present

## 2023-02-22 DIAGNOSIS — R0781 Pleurodynia: Secondary | ICD-10-CM | POA: Diagnosis not present

## 2023-02-22 DIAGNOSIS — R202 Paresthesia of skin: Secondary | ICD-10-CM | POA: Diagnosis not present

## 2023-02-22 DIAGNOSIS — E1149 Type 2 diabetes mellitus with other diabetic neurological complication: Secondary | ICD-10-CM | POA: Diagnosis not present

## 2023-03-10 DIAGNOSIS — I2510 Atherosclerotic heart disease of native coronary artery without angina pectoris: Secondary | ICD-10-CM | POA: Diagnosis not present

## 2023-03-10 DIAGNOSIS — E1149 Type 2 diabetes mellitus with other diabetic neurological complication: Secondary | ICD-10-CM | POA: Diagnosis not present

## 2023-03-10 DIAGNOSIS — Z79899 Other long term (current) drug therapy: Secondary | ICD-10-CM | POA: Diagnosis not present

## 2023-03-10 DIAGNOSIS — E7800 Pure hypercholesterolemia, unspecified: Secondary | ICD-10-CM | POA: Diagnosis not present

## 2023-03-10 DIAGNOSIS — E890 Postprocedural hypothyroidism: Secondary | ICD-10-CM | POA: Diagnosis not present

## 2023-03-10 DIAGNOSIS — C61 Malignant neoplasm of prostate: Secondary | ICD-10-CM | POA: Diagnosis not present

## 2023-03-22 DIAGNOSIS — R809 Proteinuria, unspecified: Secondary | ICD-10-CM | POA: Diagnosis not present

## 2023-03-22 DIAGNOSIS — M170 Bilateral primary osteoarthritis of knee: Secondary | ICD-10-CM | POA: Diagnosis not present

## 2023-03-22 DIAGNOSIS — G7000 Myasthenia gravis without (acute) exacerbation: Secondary | ICD-10-CM | POA: Diagnosis not present

## 2023-03-22 DIAGNOSIS — I10 Essential (primary) hypertension: Secondary | ICD-10-CM | POA: Diagnosis not present

## 2023-03-22 DIAGNOSIS — E890 Postprocedural hypothyroidism: Secondary | ICD-10-CM | POA: Diagnosis not present

## 2023-03-22 DIAGNOSIS — E1149 Type 2 diabetes mellitus with other diabetic neurological complication: Secondary | ICD-10-CM | POA: Diagnosis not present

## 2023-03-22 DIAGNOSIS — E7800 Pure hypercholesterolemia, unspecified: Secondary | ICD-10-CM | POA: Diagnosis not present

## 2023-03-22 DIAGNOSIS — I2510 Atherosclerotic heart disease of native coronary artery without angina pectoris: Secondary | ICD-10-CM | POA: Diagnosis not present

## 2023-04-01 IMAGING — CT CT HEART MORP W/ CTA COR W/ SCORE W/ CA W/CM &/OR W/O CM
1 series · 1 of 1 positions shown, 2 images · IV contrast (omnipaque)
Comparison: CT of the chest on 03/05/2011
COMPARISON: CT of the chest on 03/05/2011

Addendum:
EXAM:
OVER-READ INTERPRETATION  CT CHEST

The following report is an over-read performed by radiologist Dr.
Oddone Altana [REDACTED] on 08/15/2020. This
over-read does not include interpretation of cardiac or coronary
anatomy or pathology. The coronary CTA interpretation by the
cardiologist is attached.
HISTORY: Chest pain, nonspecific
Cardiac/Coronary  CT
TECHNIQUE: The patient was scanned on a Siemens Force scanner.
PROTOCOL: A 120 kV prospective scan was triggered in the descending thoracic
aorta at 111 HU's. Axial non-contrast 3 mm slices were carried out
through the heart. The data set was analyzed on a dedicated work
station and scored using the Agatston method. Gantry rotation speed
was 250 msecs and collimation was .6 mm. Beta blockade and 0.8 mg of
sl NTG was given. The 3D data set was reconstructed in 5% intervals
of the 35-75 % of the R-R cycle. Systolic and diastolic phases were
analyzed on a dedicated work station using MPR, MIP and VRT modes.
The patient received 100mL OMNIPAQUE IOHEXOL 350 MG/ML SOLN
contrast.

[Series 1483: aorta measurements · 0.07mm/px · 1 of 1 slices shown, 2 images]
[im 1/1  vessel]
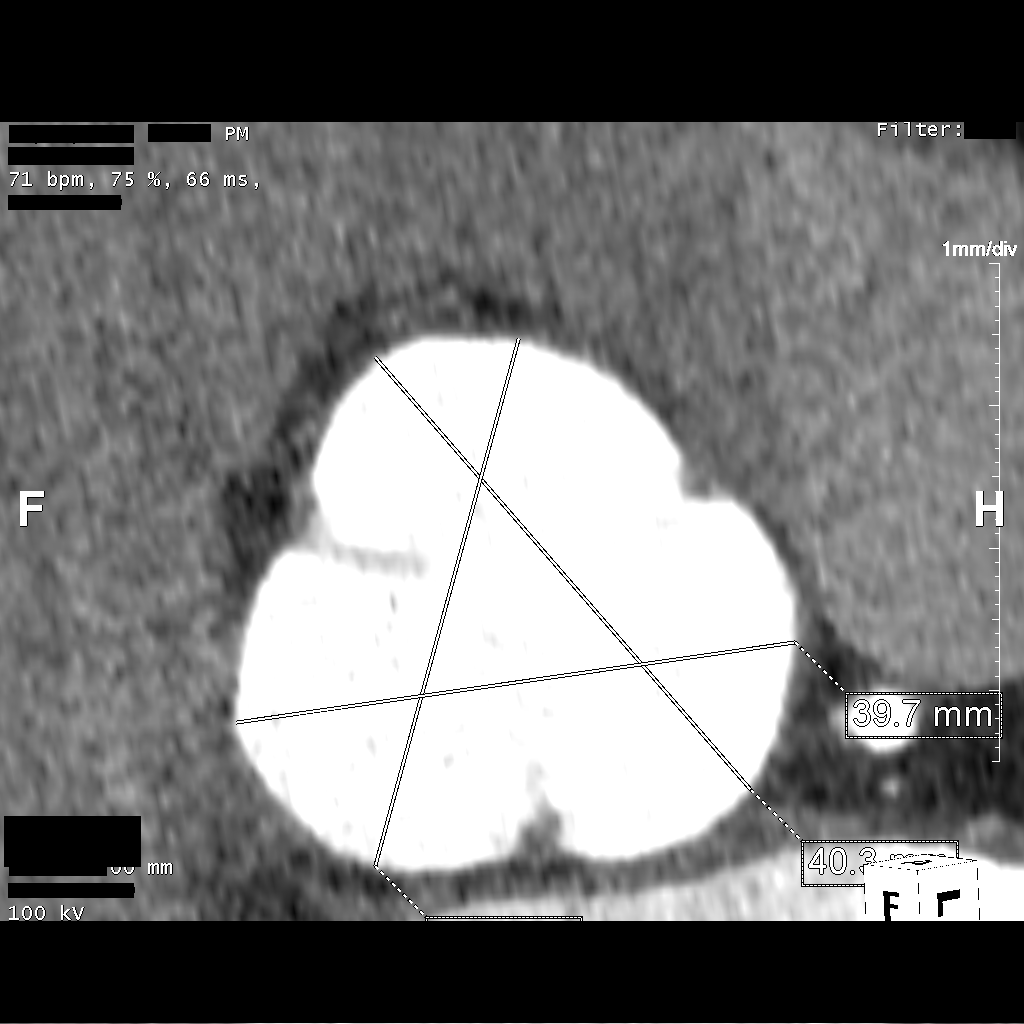
[im 1/1  lung]
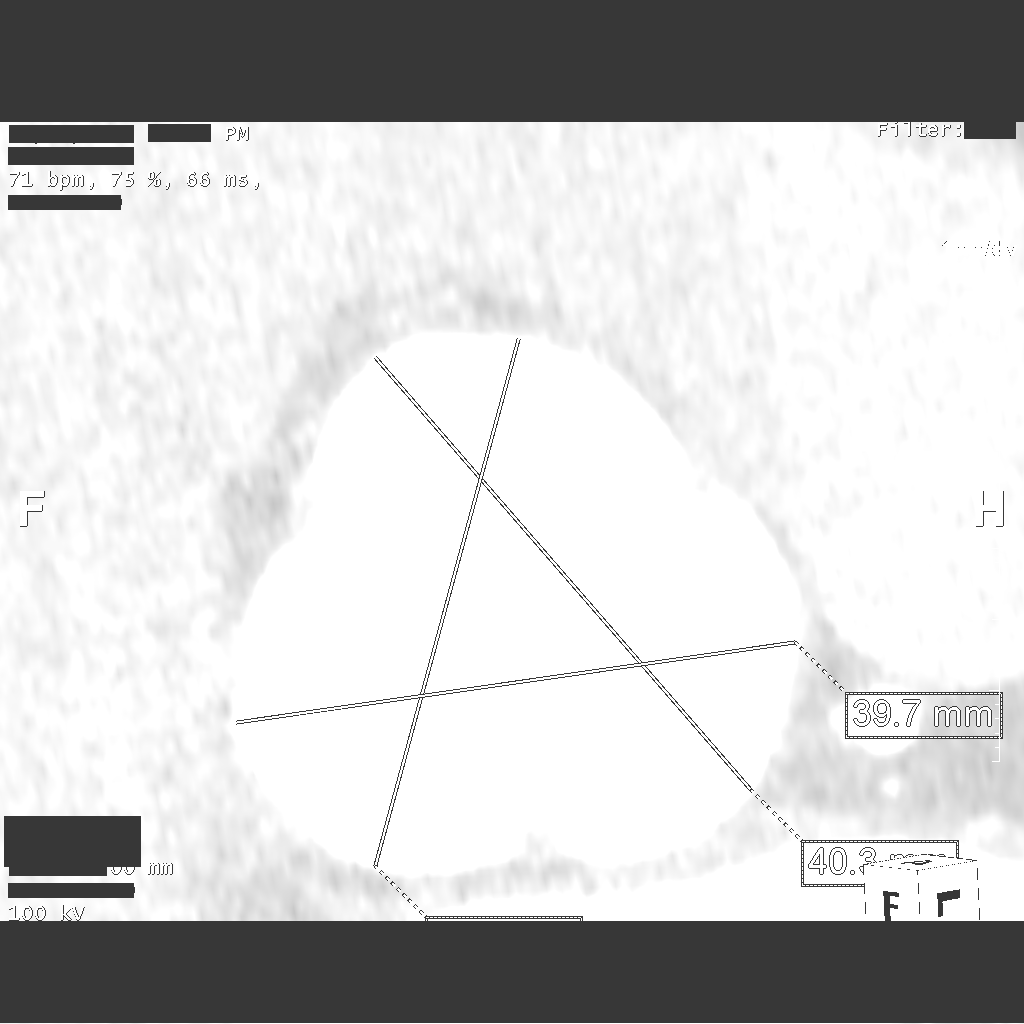

[1 of 1 positions shown; findings below may reference images not displayed]

FINDINGS: Vascular: No significant noncardiac vascular findings.

Mediastinum/Nodes: Visualized mediastinum and hilar regions
demonstrate no lymphadenopathy or masses. Small hiatal hernia
present.

Lungs/Pleura: Visualized lungs show no evidence of pulmonary edema,
consolidation, pneumothorax, nodule or pleural fluid.

Upper Abdomen: No acute abnormality.

Musculoskeletal: Rounded area of sclerosis measuring up to 18 mm is
identified in the T8 vertebral body just to the right of midline and
was not definitely present on the 3415 chest CT. This is not
associated with fracture. No other sclerotic or lytic bone lesions
identified in visualized bony structures.
IMPRESSION: 1. Rounded area of sclerosis measuring roughly 1.8 cm in greatest
diameter within the T8 vertebral body which was not present on a
3415 chest CT. The patient does have a history of prior prostate
carcinoma and this may represent a focus of metastatic disease. It
is unclear whether this is active metastatic disease and correlation
suggested initially with PSA level. Referral to the patient's
urologist is recommended.
2. Small hiatal hernia.
FINDINGS: Image quality: Good.

Noise artifact is: Mild misregistration.

Coronary calcium score is 0.

Coronary arteries: Normal coronary origins.  Right dominance.

Right Coronary Artery: Minimal atherosclerotic plaque in the mid
RCA, <25% stenosis.

Left Main Coronary Artery: No detectable plaque or stenosis.

Left Anterior Descending Coronary Artery: Probable total occlusion
of the mid LAD just beyond the branch point of the first septal
perforator and first diagonal artery, 100% stenosis with
atherosclerotic plaque. Possible channel of flow with perfusion of
distal LAD vs collateral flow from left to left collaterals.

Left Circumflex Artery: Minimal atherosclerotic plaque in the
proximal L circumflex, <25% stenosis.

Aorta:

Measurements made in double oblique technique:

Sinus of Valsalva (sinus to sinus):

R-L: 40 mm

L-Non: 40 mm

R-Non: 38 mm

ST junction: 32 mm

Mid ascending aorta (at PA bifurcation): 38 mm

Mid descending aorta (at level of pulmonary veins): 27 mm

Distal descending aorta (just proximal to aortic hiatus): 25 mm

Aortic Valve: No calcifications.

Other findings:

Normal pulmonary vein drainage into the left atrium.

Normal left atrial appendage without a thrombus.

Normal size of the pulmonary artery.
IMPRESSION: 1. Severe CAD with probable total occlusion of the mid LAD, CADRADS
= 5.

2. Coronary calcium score of 0.

3. Normal coronary origin with right dominance.

4.  Mild ascending aorta dilation, 40 mm at sinus of Valsalva

RECOMMENDATIONS
Consider cardiac catheterization for mid LAD occlusion.

*** End of Addendum ***
EXAM:
OVER-READ INTERPRETATION  CT CHEST

The following report is an over-read performed by radiologist Dr.
Oddone Altana [REDACTED] on 08/15/2020. This
over-read does not include interpretation of cardiac or coronary
anatomy or pathology. The coronary CTA interpretation by the
cardiologist is attached.
FINDINGS: Vascular: No significant noncardiac vascular findings.

Mediastinum/Nodes: Visualized mediastinum and hilar regions
demonstrate no lymphadenopathy or masses. Small hiatal hernia
present.

Lungs/Pleura: Visualized lungs show no evidence of pulmonary edema,
consolidation, pneumothorax, nodule or pleural fluid.

Upper Abdomen: No acute abnormality.

Musculoskeletal: Rounded area of sclerosis measuring up to 18 mm is
identified in the T8 vertebral body just to the right of midline and
was not definitely present on the 3415 chest CT. This is not
associated with fracture. No other sclerotic or lytic bone lesions
identified in visualized bony structures.
IMPRESSION: 1. Rounded area of sclerosis measuring roughly 1.8 cm in greatest
diameter within the T8 vertebral body which was not present on a
3415 chest CT. The patient does have a history of prior prostate
carcinoma and this may represent a focus of metastatic disease. It
is unclear whether this is active metastatic disease and correlation
suggested initially with PSA level. Referral to the patient's
urologist is recommended.
2. Small hiatal hernia.

## 2023-04-15 ENCOUNTER — Other Ambulatory Visit: Payer: Self-pay | Admitting: Neurology

## 2023-04-18 MED ORDER — AZATHIOPRINE 50 MG PO TABS: 75.0000 mg | ORAL_TABLET | Freq: Two times a day (BID) | ORAL | 1 refills | Status: DC

## 2023-04-18 NOTE — Telephone Encounter (Signed)
 Pt's wife called wanting to know why his medication is not getting approved for a refill. Please advise.

## 2023-04-18 NOTE — Addendum Note (Signed)
 Addended by: Allanna Bresee on: 04/18/2023 05:41 PM   Modules accepted: Orders

## 2023-04-18 NOTE — Telephone Encounter (Signed)
 Please call patient, to update his medication list, based on last visit, he should stop Imuran, taking CellCept  ASSESSMENT AND PLAN   Mario Burns is a 78 y.o. male   Long history of myasthenia gravis             Moderate bulbar weakness, mild to moderate limb muscle weakness on examination,             He complains of intermittent chewing swallowing difficulty, dysarthria,even SOB with exertion             He would benefit better control of his myasthenia gravis symptoms, but very hesitant with medications, for many years, despite multiple effort of changes, he remains on low-dose Imuran 50 mg bid, mestinon 30mg  bid,  Discussed with patient and his wife, agree to try CellCept 500 mg 2 tablets twice a day, will hold Imuran, also Mestinon to decrease the GI side effect return to clinic in 3 months

## 2023-04-18 NOTE — Telephone Encounter (Signed)
 Called and spoke to pt wife and she stated that she hasn't been able to get him to take cellcept since cost prohibitive and that he stayed on the imuran and would like a refill

## 2023-04-18 NOTE — Telephone Encounter (Signed)
 Dr. Gracie Lav,  Based on last note I wanted to make sure is we refuse or not. Please advise:  Discussed with patient and his wife, agree to try CellCept 500 mg 2 tablets twice a day, will hold Imuran, also Mestinon to decrease the GI side effect return to clinic in 3 months

## 2023-04-18 NOTE — Telephone Encounter (Signed)
 Meds ordered this encounter  Medications   azaTHIOprine (IMURAN) 50 MG tablet    Sig: Take 1.5 tablets (75 mg total) by mouth 2 (two) times daily.    Dispense:  270 tablet    Refill:  1

## 2023-04-19 DIAGNOSIS — M1712 Unilateral primary osteoarthritis, left knee: Secondary | ICD-10-CM | POA: Diagnosis not present

## 2023-04-19 DIAGNOSIS — E1149 Type 2 diabetes mellitus with other diabetic neurological complication: Secondary | ICD-10-CM | POA: Diagnosis not present

## 2023-04-26 DIAGNOSIS — E1149 Type 2 diabetes mellitus with other diabetic neurological complication: Secondary | ICD-10-CM | POA: Diagnosis not present

## 2023-04-26 DIAGNOSIS — M1711 Unilateral primary osteoarthritis, right knee: Secondary | ICD-10-CM | POA: Diagnosis not present

## 2023-04-28 ENCOUNTER — Telehealth: Payer: Self-pay | Admitting: Neurology

## 2023-04-28 NOTE — Telephone Encounter (Signed)
 Wife request to cancel appointment, not needed at this time

## 2023-05-02 ENCOUNTER — Ambulatory Visit: Payer: PPO | Admitting: Neurology

## 2023-06-08 DIAGNOSIS — M25562 Pain in left knee: Secondary | ICD-10-CM

## 2023-06-08 DIAGNOSIS — M25561 Pain in right knee: Secondary | ICD-10-CM

## 2023-06-08 HISTORY — DX: Pain in left knee: M25.562

## 2023-06-08 HISTORY — DX: Pain in right knee: M25.561

## 2023-06-10 DIAGNOSIS — M25561 Pain in right knee: Secondary | ICD-10-CM | POA: Diagnosis not present

## 2023-06-10 DIAGNOSIS — M25562 Pain in left knee: Secondary | ICD-10-CM | POA: Diagnosis not present

## 2023-07-18 DIAGNOSIS — E890 Postprocedural hypothyroidism: Secondary | ICD-10-CM | POA: Diagnosis not present

## 2023-07-18 DIAGNOSIS — I10 Essential (primary) hypertension: Secondary | ICD-10-CM | POA: Diagnosis not present

## 2023-07-18 DIAGNOSIS — I2510 Atherosclerotic heart disease of native coronary artery without angina pectoris: Secondary | ICD-10-CM | POA: Diagnosis not present

## 2023-07-18 DIAGNOSIS — G7000 Myasthenia gravis without (acute) exacerbation: Secondary | ICD-10-CM | POA: Diagnosis not present

## 2023-07-18 DIAGNOSIS — R809 Proteinuria, unspecified: Secondary | ICD-10-CM | POA: Diagnosis not present

## 2023-07-18 DIAGNOSIS — E7800 Pure hypercholesterolemia, unspecified: Secondary | ICD-10-CM | POA: Diagnosis not present

## 2023-07-18 DIAGNOSIS — E1149 Type 2 diabetes mellitus with other diabetic neurological complication: Secondary | ICD-10-CM | POA: Diagnosis not present

## 2023-07-18 DIAGNOSIS — M170 Bilateral primary osteoarthritis of knee: Secondary | ICD-10-CM | POA: Diagnosis not present

## 2023-07-28 ENCOUNTER — Encounter: Payer: Self-pay | Admitting: Cardiology

## 2023-07-28 ENCOUNTER — Encounter: Payer: Self-pay | Admitting: *Deleted

## 2023-08-09 DIAGNOSIS — M47812 Spondylosis without myelopathy or radiculopathy, cervical region: Secondary | ICD-10-CM | POA: Insufficient documentation

## 2023-08-09 DIAGNOSIS — K219 Gastro-esophageal reflux disease without esophagitis: Secondary | ICD-10-CM | POA: Insufficient documentation

## 2023-08-09 DIAGNOSIS — F40240 Claustrophobia: Secondary | ICD-10-CM | POA: Insufficient documentation

## 2023-08-09 DIAGNOSIS — T466X5A Adverse effect of antihyperlipidemic and antiarteriosclerotic drugs, initial encounter: Secondary | ICD-10-CM | POA: Insufficient documentation

## 2023-08-09 DIAGNOSIS — T8859XA Other complications of anesthesia, initial encounter: Secondary | ICD-10-CM | POA: Insufficient documentation

## 2023-08-09 DIAGNOSIS — E119 Type 2 diabetes mellitus without complications: Secondary | ICD-10-CM | POA: Insufficient documentation

## 2023-08-09 DIAGNOSIS — M1990 Unspecified osteoarthritis, unspecified site: Secondary | ICD-10-CM | POA: Insufficient documentation

## 2023-08-09 DIAGNOSIS — M179 Osteoarthritis of knee, unspecified: Secondary | ICD-10-CM | POA: Insufficient documentation

## 2023-08-09 DIAGNOSIS — N3946 Mixed incontinence: Secondary | ICD-10-CM | POA: Insufficient documentation

## 2023-08-09 DIAGNOSIS — R0602 Shortness of breath: Secondary | ICD-10-CM | POA: Insufficient documentation

## 2023-08-09 DIAGNOSIS — E785 Hyperlipidemia, unspecified: Secondary | ICD-10-CM | POA: Insufficient documentation

## 2023-08-09 DIAGNOSIS — F418 Other specified anxiety disorders: Secondary | ICD-10-CM | POA: Insufficient documentation

## 2023-08-09 DIAGNOSIS — E7800 Pure hypercholesterolemia, unspecified: Secondary | ICD-10-CM | POA: Insufficient documentation

## 2023-08-09 DIAGNOSIS — R3912 Poor urinary stream: Secondary | ICD-10-CM | POA: Insufficient documentation

## 2023-08-09 DIAGNOSIS — Z8719 Personal history of other diseases of the digestive system: Secondary | ICD-10-CM | POA: Insufficient documentation

## 2023-08-09 DIAGNOSIS — Z8639 Personal history of other endocrine, nutritional and metabolic disease: Secondary | ICD-10-CM | POA: Insufficient documentation

## 2023-08-09 DIAGNOSIS — Z789 Other specified health status: Secondary | ICD-10-CM | POA: Insufficient documentation

## 2023-08-09 DIAGNOSIS — E1149 Type 2 diabetes mellitus with other diabetic neurological complication: Secondary | ICD-10-CM | POA: Insufficient documentation

## 2023-08-09 DIAGNOSIS — E890 Postprocedural hypothyroidism: Secondary | ICD-10-CM | POA: Insufficient documentation

## 2023-08-09 DIAGNOSIS — M1711 Unilateral primary osteoarthritis, right knee: Secondary | ICD-10-CM | POA: Insufficient documentation

## 2023-08-09 DIAGNOSIS — I700 Atherosclerosis of aorta: Secondary | ICD-10-CM | POA: Insufficient documentation

## 2023-08-09 DIAGNOSIS — E349 Endocrine disorder, unspecified: Secondary | ICD-10-CM | POA: Insufficient documentation

## 2023-08-09 NOTE — Progress Notes (Signed)
 " Cardiology Office Note   Date:  08/10/2023  ID:  Mario Burns, DOB Apr 22, 1945, MRN [id number] PCP: Montey Lot, PA-C  [facility] Providers Cardiologist:  Redell Leiter, MD Cardiology APP:  Carlin Delon BROCKS, NP     History of Present Illness Mario Burns is a 78 y.o. male with a past medical history of CAD with known CTO of the mid LAD, SVT, myasthenia gravis.  04/25/2022 monitor average heart rate 66 bpm, nighttime sinus bradycardia 40 to 45 bpm, 51 triggered events associated with sinus rhythm PVCs and APCs, 12 episodes of SVT 09/16/2020 monitor second-degree AV block type I, 9 episodes of SVT, 1 episode of NSVT 11/23/2020 echo EF 50 to 55%, grade 1 DD, mild aortic valve sclerosis, mild dilatation of the ascending aorta at 41 mm 08/26/2020 cardiac cath CTO of the mid LAD 08/15/2020 coronary CTA calcium  score of 0 for severe CAD with probable total occlusion of the mid LAD  He established with [facility] in 2022 for the evaluation of chest pain.  He underwent a coronary CTA revealing probable total occlusion of the mid LAD >> underwent left heart cath soon after revealing CTO of the mid LAD however filled by collateral circulation with recommendations for medical therapy.  In 2022 MRI monitor revealing episodes of SVT as well as second-degree AV block type I during sleeping hours.  2024 another monitor was arranged which was similar to results in 2022.  Most recently he was evaluated by Dr. Leiter on 07/02/2022, Several concerns related to different medications and he has multiple intolerances, suggested that he take a holiday from his statin to see if this helps with his symptoms, plans were made to follow-up in 6 months.  He presents today accompanied by his wife for follow-up of his CAD.  He a few episodes during the night a month ago where he woke up with dizziness, some vague symptoms but possibly some chest pain.  He mentioned this when he was evaluated by his PCP and they  suggest he follow back up with cardiology.  He states this was just during a specific week during July and it has not happened since.  We discussed proceeding with an ischemic evaluation, he does not think it is warranted at this time.  Previously been on isosorbide  however was not able to tolerate this secondary to headaches.  He is also bothered by dizziness at time.  We discussed increasing water  intake.  He also mentions he has frequent falls, this is not necessarily new for him but stumbles very easily, he feels he is not picking his feet up all the way.  Today, he denies chest pain, palpitations, dyspnea, pnd, orthopnea, n, v, dizziness, syncope, edema, weight gain, or early satiety.   ROS: Review of Systems  Cardiovascular:  Positive for chest pain.  Musculoskeletal:  Positive for falls.  Neurological:  Positive for dizziness.  All other systems reviewed and are negative.    Studies Reviewed      Cardiac Studies & Procedures   ______________________________________________________________________________________________ CARDIAC CATHETERIZATION  CARDIAC CATHETERIZATION 08/26/2020  Conclusion   Mid Cx lesion is 60% stenosed.   Prox Cx to Mid Cx lesion is 50% stenosed.   Mid LAD lesion is 100% stenosed.   Dist LAD lesion is 100% stenosed.   The left ventricular systolic function is normal.   LV end diastolic pressure is normal.   The left ventricular ejection fraction is 55-65% by visual estimate.   There is no aortic valve  stenosis.  Chronic total occlusion of the mid LAD.  There may be a distal LAD occlusion as well.  Collaterals fill the mid to distal LAD.  Medical therapy.  Sclerotic spine lesion currently under investigation.  Findings Coronary Findings Diagnostic  Dominance: Right  Left Anterior Descending Collaterals Dist LAD filled by collaterals from RPDA.  Collaterals Dist LAD filled by collaterals from RPDA.  Mid LAD lesion is 100% stenosed. The lesion is  chronically occluded with right-to-left and left-to-left collateral flow. Dist LAD lesion is 100% stenosed. The lesion is chronically occluded.  Second Septal Branch Collaterals 2nd Sept filled by collaterals from 1st Sept.  Left Circumflex Prox Cx to Mid Cx lesion is 50% stenosed. Mid Cx lesion is 60% stenosed.  Right Coronary Artery Vessel is large. The vessel exhibits minimal luminal irregularities.  Intervention  No interventions have been documented.     ECHOCARDIOGRAM  ECHOCARDIOGRAM COMPLETE 08/14/2020  Narrative ECHOCARDIOGRAM REPORT    Patient Name:   Mario Burns Date of Exam: 08/14/2020 Medical Rec #:  [id number]       Height:       69.0 in Accession #:    [id number]      Weight:       176.6 lb Date of Birth:  December 24, 1945        BSA:          1.960 m Patient Age:    75 years        BP:           162/98 mmHg Patient Gender: M               HR:           61 bpm. Exam Location:  [address]  Procedure: 2D Echo, Cardiac Doppler and Color Doppler  Indications:    R06.00 Dyspnea  History:        Patient has no prior history of Echocardiogram examinations. Risk Factors:Hypertension and Diabetes. Obstructive sleep apnea. Shortness of breath.  Sonographer:    Carl Rodgers-Jones RDCS Referring Phys: [id number] CHRISTOPHER L SCHUMANN  IMPRESSIONS   1. Left ventricular ejection fraction, by estimation, is 50 to 55%. The left ventricle has low normal function. The left ventricle has no regional wall motion abnormalities. Left ventricular diastolic parameters are consistent with Grade I diastolic dysfunction (impaired relaxation). 2. Right ventricular systolic function is normal. The right ventricular size is normal. 3. The mitral valve is normal in structure. Trivial mitral valve regurgitation. No evidence of mitral stenosis. 4. The aortic valve is tricuspid. Aortic valve regurgitation is trivial. Mild aortic valve sclerosis is present, with no evidence of aortic  valve stenosis. 5. Aortic dilatation noted. There is mild dilatation of the ascending aorta, measuring 41 mm.  FINDINGS Left Ventricle: Left ventricular ejection fraction, by estimation, is 50 to 55%. The left ventricle has low normal function. The left ventricle has no regional wall motion abnormalities. The left ventricular internal cavity size was normal in size. There is no left ventricular hypertrophy. Left ventricular diastolic parameters are consistent with Grade I diastolic dysfunction (impaired relaxation).  Right Ventricle: The right ventricular size is normal. Right ventricular systolic function is normal.  Left Atrium: Left atrial size was normal in size.  Right Atrium: Right atrial size was normal in size.  Pericardium: There is no evidence of pericardial effusion.  Mitral Valve: The mitral valve is normal in structure. Trivial mitral valve regurgitation. No evidence of mitral valve stenosis.  Tricuspid Valve: The tricuspid  valve is normal in structure. Tricuspid valve regurgitation is mild . No evidence of tricuspid stenosis.  Aortic Valve: The aortic valve is tricuspid. Aortic valve regurgitation is trivial. Mild aortic valve sclerosis is present, with no evidence of aortic valve stenosis.  Pulmonic Valve: The pulmonic valve was normal in structure. Pulmonic valve regurgitation is trivial. No evidence of pulmonic stenosis.  Aorta: Aortic dilatation noted. There is mild dilatation of the ascending aorta, measuring 41 mm.  Venous: The inferior vena cava was not well visualized.  IAS/Shunts: The interatrial septum appears to be lipomatous. No atrial level shunt detected by color flow Doppler.   LEFT VENTRICLE PLAX 2D LVIDd:         4.10 cm  Diastology LVIDs:         2.90 cm  LV e' medial:    6.42 cm/s LV PW:         0.90 cm  LV E/e' medial:  10.5 LV IVS:        0.90 cm  LV e' lateral:   6.31 cm/s LVOT diam:     1.90 cm  LV E/e' lateral: 10.7 LV SV:         66 LV SV  Index:   33 LVOT Area:     2.84 cm   RIGHT VENTRICLE RV Basal diam:  4.60 cm RV S prime:     13.40 cm/s TAPSE (M-mode): 2.1 cm  LEFT ATRIUM             Index       RIGHT ATRIUM           Index LA diam:        4.00 cm 2.04 cm/m  RA Area:     14.20 cm LA Vol (A2C):   53.8 ml 27.45 ml/m RA Volume:   39.40 ml  20.11 ml/m LA Vol (A4C):   41.9 ml 21.38 ml/m LA Biplane Vol: 48.4 ml 24.70 ml/m AORTIC VALVE LVOT Vmax:   111.50 cm/s LVOT Vmean:  71.700 cm/s LVOT VTI:    0.232 m  AORTA Ao Root diam: 3.80 cm Ao Asc diam:  4.30 cm  MITRAL VALVE               TRICUSPID VALVE MV Area (PHT): 2.91 cm    TR Peak grad:   24.6 mmHg MV Decel Time: 261 msec    TR Vmax:        248.00 cm/s MV E velocity: 67.70 cm/s MV A velocity: 93.80 cm/s  SHUNTS MV E/A ratio:  0.72        Systemic VTI:  0.23 m Systemic Diam: 1.90 cm  Redell Shallow MD Electronically signed by Redell Shallow MD Signature Date/Time: 08/14/2020/12:42:43 PM    Final    MONITORS  LONG TERM MONITOR (3-14 DAYS) 04/21/2022  Narrative Patch Wear Time:  13 days and 20 hours (2024-03-25T11:38:49-0400 to 2024-04-08T08:00:11-398)  Patient had a min HR of 22 bpm, max HR of 143 bpm, and avg HR of 66 bpm. Predominant underlying rhythm was Sinus Rhythm.  Nighttime sinus bradycardia rate of 40 to 45 bpm was noted.  There were 51 triggered events all sinus rhythm frequently associated with PVCs more than APCs and rarely with frequent PVCs.  12 Supraventricular Tachycardia runs occurred, the run with the fastest interval lasting 4 beats with a max rate of 143 bpm, the longest lasting 14 beats with an avg rate of 95 bpm.  Nocturnal second Degree AV Block-Mobitz I (Wenckebach) was present.  There were no pauses of 3 seconds or greater and no episodes of higher degree AV block.  Isolated SVEs were rare (<1.0%), SVE Couplets were rare (<1.0%), and SVE Triplets were rare (<1.0%).  There were no episodes of atrial fibrillation or  flutter.  Isolated VEs were rare (<1.0%), VE Couplets were rare (<1.0%), and no VE Triplets were present. Ventricular Trigeminy was present.  There is 1 7 beat run of PVCs noted at a slow rate.   CT SCANS  CT CORONARY MORPH W/CTA COR W/SCORE 08/15/2020  Addendum 08/15/2020  2:34 PM ADDENDUM REPORT: 08/15/2020 14:32  HISTORY: Chest pain, nonspecific  EXAM: Cardiac/Coronary  CT  TECHNIQUE: The patient was scanned on a [employer].  PROTOCOL: A 120 kV prospective scan was triggered in the descending thoracic aorta at 111 HU's. Axial non-contrast 3 mm slices were carried out through the heart. The data set was analyzed on a dedicated work station and scored using the Agatston method. Gantry rotation speed was 250 msecs and collimation was .6 mm. Beta blockade and 0.8 mg of sl NTG was given. The 3D data set was reconstructed in 5% intervals of the 35-75 % of the R-R cycle. Systolic and diastolic phases were analyzed on a dedicated work station using MPR, MIP and VRT modes. The patient received 100mL OMNIPAQUE  IOHEXOL  350 MG/ML SOLN contrast.  FINDINGS: Image quality: Good.  Noise artifact is: Mild misregistration.  Coronary calcium  score is 0.  Coronary arteries: Normal coronary origins.  Right dominance.  Right Coronary Artery: Minimal atherosclerotic plaque in the mid RCA, <25% stenosis.  Left Main Coronary Artery: No detectable plaque or stenosis.  Left Anterior Descending Coronary Artery: Probable total occlusion of the mid LAD just beyond the branch point of the first septal perforator and first diagonal artery, 100% stenosis with atherosclerotic plaque. Possible channel of flow with perfusion of distal LAD vs collateral flow from left to left collaterals.  Left Circumflex Artery: Minimal atherosclerotic plaque in the proximal L circumflex, <25% stenosis.  Aorta:  Measurements made in double oblique technique:  Sinus of Valsalva (sinus to  sinus):  R-L: 40 mm  L-Non: 40 mm  R-Non: 38 mm  ST junction: 32 mm  Mid ascending aorta (at PA bifurcation): 38 mm  Mid descending aorta (at level of pulmonary veins): 27 mm  Distal descending aorta (just proximal to aortic hiatus): 25 mm  Aortic Valve: No calcifications.  Other findings:  Normal pulmonary vein drainage into the left atrium.  Normal left atrial appendage without a thrombus.  Normal size of the pulmonary artery.  IMPRESSION: 1. Severe CAD with probable total occlusion of the mid LAD, CADRADS = 5.  2. Coronary calcium  score of 0.  3. Normal coronary origin with right dominance.  4.  Mild ascending aorta dilation, 40 mm at sinus of Valsalva  RECOMMENDATIONS Consider cardiac catheterization for mid LAD occlusion.   Electronically Signed By: Soyla Merck M.D. On: 08/15/2020 14:32  Narrative EXAM: OVER-READ INTERPRETATION  CT CHEST  The following report is an over-read performed by radiologist Dr. Marcey Moan of [facility] on 08/15/2020. This over-read does not include interpretation of cardiac or coronary anatomy or pathology. The coronary CTA interpretation by the cardiologist is attached.  COMPARISON:  CT of the chest on 03/05/2011  FINDINGS: Vascular: No significant noncardiac vascular findings.  Mediastinum/Nodes: Visualized mediastinum and hilar regions demonstrate no lymphadenopathy or masses. Small hiatal hernia present.  Lungs/Pleura: Visualized lungs show no evidence of pulmonary edema, consolidation, pneumothorax,  nodule or pleural fluid.  Upper Abdomen: No acute abnormality.  Musculoskeletal: Rounded area of sclerosis measuring up to 18 mm is identified in the T8 vertebral body just to the right of midline and was not definitely present on the 2013 chest CT. This is not associated with fracture. No other sclerotic or lytic bone lesions identified in visualized bony structures.  IMPRESSION: 1.  Rounded area of sclerosis measuring roughly 1.8 cm in greatest diameter within the T8 vertebral body which was not present on a 2013 chest CT. The patient does have a history of prior prostate carcinoma and this may represent a focus of metastatic disease. It is unclear whether this is active metastatic disease and correlation suggested initially with PSA level. Referral to the patient's urologist is recommended. 2. Small hiatal hernia.  Electronically Signed: By: Marcey Moan M.D. On: 08/15/2020 12:30     ______________________________________________________________________________________________      Risk Assessment/Calculations      Physical Exam VS:  BP (!) 140/90   Pulse 65   Ht 5' 7.5 (1.715 m)   Wt 185 lb (83.9 kg)   SpO2 97%   BMI 28.55 kg/m        Wt Readings from Last 3 Encounters:  08/10/23 185 lb (83.9 kg)  07/18/23 180 lb (81.6 kg)  01/31/23 189 lb (85.7 kg)    GEN: Well nourished, well developed in no acute distress NECK: No JVD; No carotid bruits CARDIAC: RRR, no murmurs, rubs, gallops RESPIRATORY:  Clear to auscultation without rales, wheezing or rhonchi  ABDOMEN: Soft, non-tender, non-distended EXTREMITIES: Legs are edematous but not pitting; No deformity   ASSESSMENT AND PLAN CAD -known CTO of the mid LAD, previously been on isosorbide  for chest pain but was not able to tolerate this, he had a few episodes a month ago that were concerning for him, we discussed a few different options for now, we will increase his Norvasc  to 7.5 mg daily, encouraged him to increase oral hydration and keep a close eye on his symptoms.  Will plan for close follow-up in a month.  Edema-he has some shortness of breath will check proBNP, it appears to be more venous insufficiency.  Palpitations -currently quiescent for him.  Hypertension-blood pressure slightly elevated today at 140/90, continue Cozaar  100 mg daily, we are increasing his amlodipine  to 7.5 mg daily  and this might bring his blood pressure down to a more desirable range.  Dyslipidemia-will repeat a CMET, FLP, LP(a)- plan to put him on bempedoic acid  as I do not want a risk even in the lipophilic statins exacerbating his myasthenia gravis.  Myasthenia gravis-follows with neurologist in [place].  Currently stable        Dispo: Labs per above, increase amlodipine  to 7.5 mg daily. Follow up in 1 month.   Signed, Delon JAYSON Hoover, NP  "

## 2023-08-10 ENCOUNTER — Encounter: Payer: Self-pay | Admitting: Cardiology

## 2023-08-10 ENCOUNTER — Ambulatory Visit: Attending: Cardiology | Admitting: Cardiology

## 2023-08-10 VITALS — BP 140/90 | HR 65 | Ht 67.5 in | Wt 185.0 lb

## 2023-08-10 DIAGNOSIS — R0602 Shortness of breath: Secondary | ICD-10-CM

## 2023-08-10 DIAGNOSIS — E785 Hyperlipidemia, unspecified: Secondary | ICD-10-CM | POA: Diagnosis not present

## 2023-08-10 DIAGNOSIS — Z789 Other specified health status: Secondary | ICD-10-CM

## 2023-08-10 DIAGNOSIS — I2510 Atherosclerotic heart disease of native coronary artery without angina pectoris: Secondary | ICD-10-CM

## 2023-08-10 DIAGNOSIS — R002 Palpitations: Secondary | ICD-10-CM | POA: Diagnosis not present

## 2023-08-10 DIAGNOSIS — I10 Essential (primary) hypertension: Secondary | ICD-10-CM

## 2023-08-10 DIAGNOSIS — I7789 Other specified disorders of arteries and arterioles: Secondary | ICD-10-CM | POA: Diagnosis not present

## 2023-08-10 MED ORDER — AMLODIPINE BESYLATE 2.5 MG PO TABS: 2.5000 mg | ORAL_TABLET | Freq: Every day | ORAL | 3 refills | Status: AC

## 2023-08-10 NOTE — Patient Instructions (Signed)
 Medication Instructions:  Your physician has recommended you make the following change in your medication:   Increase your Amlodipine  to 7.5 mg daily  *If you need a refill on your cardiac medications before your next appointment, please call your pharmacy*   Lab Work: Your physician recommends that you have a CMP, ProBNP, direct LDL and Lp(a) today in the office.  If you have labs (blood work) drawn today and your tests are completely normal, you will receive your results only by: MyChart Message (if you have MyChart) OR A paper copy in the mail If you have any lab test that is abnormal or we need to change your treatment, we will call you to review the results.   Testing/Procedures: None ordered   Follow-Up: At [facility], you and your health needs are our priority.  As part of our continuing mission to provide you with exceptional heart care, we have created designated Provider Care Teams.  These Care Teams include your primary Cardiologist (physician) and Advanced Practice Providers (APPs -  Physician Assistants and Nurse Practitioners) who all work together to provide you with the care you need, when you need it.  We recommend signing up for the patient portal called MyChart.  Sign up information is provided on this After Visit Summary.  MyChart is used to connect with patients for Virtual Visits (Telemedicine).  Patients are able to view lab/test results, encounter notes, upcoming appointments, etc.  Non-urgent messages can be sent to your provider as well.   To learn more about what you can do with MyChart, go to [email or web address].    Your next appointment:   1 month(s)  The format for your next appointment:   In Person  Provider:   Delon Hoover, NP [place])    Other Instructions Increase your fluid intake to 48 oz daily.  Important Information About Sugar

## 2023-08-11 ENCOUNTER — Ambulatory Visit: Payer: Self-pay | Admitting: Cardiology

## 2023-08-11 ENCOUNTER — Encounter: Payer: Self-pay | Admitting: Cardiology

## 2023-08-11 LAB — COMPREHENSIVE METABOLIC PANEL WITH GFR
ALT: 27 IU/L (ref 0–44)
AST: 24 IU/L (ref 0–40)
Albumin: 3.9 g/dL (ref 3.8–4.8)
Alkaline Phosphatase: 99 IU/L (ref 44–121)
BUN/Creatinine Ratio: 18 (ref 10–24)
BUN: 19 mg/dL (ref 8–27)
Bilirubin Total: 0.6 mg/dL (ref 0.0–1.2)
CO2: 22 mmol/L (ref 20–29)
Calcium: 9.5 mg/dL (ref 8.6–10.2)
Chloride: 100 mmol/L (ref 96–106)
Creatinine, Ser: 1.07 mg/dL (ref 0.76–1.27)
Globulin, Total: 2.4 g/dL (ref 1.5–4.5)
Glucose: 133 mg/dL — ABNORMAL HIGH (ref 70–99)
Potassium: 4.3 mmol/L (ref 3.5–5.2)
Sodium: 135 mmol/L (ref 134–144)
Total Protein: 6.3 g/dL (ref 6.0–8.5)
eGFR: 71 mL/min/1.73

## 2023-08-11 LAB — LIPOPROTEIN A (LPA): Lipoprotein (a): <8.4 nmol/L

## 2023-08-11 LAB — PRO B NATRIURETIC PEPTIDE: NT-Pro BNP: 460 pg/mL (ref 0–486)

## 2023-08-11 LAB — LDL CHOLESTEROL, DIRECT: LDL Direct: 131 mg/dL — ABNORMAL HIGH (ref 0–99)

## 2023-08-12 ENCOUNTER — Other Ambulatory Visit: Payer: Self-pay

## 2023-08-12 DIAGNOSIS — E785 Hyperlipidemia, unspecified: Secondary | ICD-10-CM

## 2023-08-12 MED ORDER — NEXLETOL 180 MG PO TABS: 180.0000 mg | ORAL_TABLET | Freq: Every day | ORAL | 0 refills | Status: DC

## 2023-08-12 MED ORDER — BEMPEDOIC ACID 180 MG PO TABS: 180.0000 mg | ORAL_TABLET | Freq: Every day | ORAL | 3 refills | Status: DC

## 2023-08-15 ENCOUNTER — Telehealth: Payer: Self-pay

## 2023-08-15 ENCOUNTER — Other Ambulatory Visit (HOSPITAL_COMMUNITY): Payer: Self-pay

## 2023-08-15 NOTE — Telephone Encounter (Signed)
 Pharmacy Patient Advocate Encounter  Received notification from [employer] that Prior Authorization for NEXLETOL  has been APPROVED from 08/15/23 to 02/11/24. Ran test claim, Copay is $100. This test claim was processed through [facility]- copay amounts may vary at other pharmacies due to pharmacy/plan contracts, or as the patient moves through the different stages of their insurance plan.

## 2023-08-15 NOTE — Telephone Encounter (Signed)
 Pharmacy Patient Advocate Encounter   Received notification from CoverMyMeds that prior authorization for NEXLETOL  is required/requested.   Insurance verification completed.   The patient is insured through [facility] [employer]VANT[employer]/RX ADVANCE .   Per test claim: PA required; PA submitted to above mentioned insurance via Latent Key/confirmation #/EOC [id number] Status is pending

## 2023-09-07 NOTE — Progress Notes (Signed)
 " Cardiology Office Note   Date:  09/13/2023  ID:  Mario Burns, DOB 23-Jul-1945, MRN [id number] PCP: Montey Lot, PA-C  [facility] Providers Cardiologist:  Redell Leiter, MD Cardiology APP:  Carlin Delon BROCKS, NP     History of Present Illness Mario Burns is a 78 y.o. male with a past medical history of CAD with known CTO of the mid LAD, SVT, myasthenia gravis, dyslipidemia with statin intolerance.  04/25/2022 monitor average heart rate 66 bpm, nighttime sinus bradycardia 40 to 45 bpm, 51 triggered events associated with sinus rhythm PVCs and APCs, 12 episodes of SVT 09/16/2020 monitor second-degree AV block type I, 9 episodes of SVT, 1 episode of NSVT 11/23/2020 echo EF 50 to 55%, grade 1 DD, mild aortic valve sclerosis, mild dilatation of the ascending aorta at 41 mm 08/26/2020 cardiac cath CTO of the mid LAD 08/15/2020 coronary CTA calcium  score of 0 for severe CAD with probable total occlusion of the mid LAD  He established with [facility] in 2022 for the evaluation of chest pain.  He underwent a coronary CTA revealing probable total occlusion of the mid LAD >> underwent left heart cath soon after revealing CTO of the mid LAD however filled by collateral circulation with recommendations for medical therapy.  In 2022 MRI monitor revealing episodes of SVT as well as second-degree AV block type I during sleeping hours.  2024 another monitor was arranged which was similar to results in 2022.  Most recently he was evaluated by Dr. Leiter on 07/02/2022, Several concerns related to different medications and he has multiple intolerances, suggested that he take a holiday from his statin to see if this helps with his symptoms, plans were made to follow-up in 6 months.  Most recently evaluated by myself on 08/10/2023, he had had a few episodes that were vague and hard to explain for him but mention some episodes of chest pain, he had been evaluated by his PCP and they suggest he follow-up with  cardiology, we discussed repeating ischemic evaluation but he did not feel this was necessary, had been intolerant of long-acting nitrates so we ultimately increase his amlodipine  with plans for quick follow-up.  He presents today accompanied by his wife for follow-up after recent visit as outlined above.  Overall, he is feeling okay, does not have any real formal complaints, is bothered by shortness of breath at time but he feels this been going on for some time.  We had also started him on Nexletol  however he had an intolerance to this as well.  We discussed referring to Pharm.D. for further lipid-lowering medication however he wants to follow-up with his PCP in a few weeks to retry what he previously had him on--he cannot recall what this medication is called though.  He follows closely with his PCP seeing him every 3 months or every 6 months depending on what is going on.  He denies chest pain, palpitations, dyspnea, pnd, orthopnea, n, v, syncope, edema, weight gain, or early satiety.    ROS: Review of Systems  Neurological:  Positive for dizziness.  All other systems reviewed and are negative.    Studies Reviewed      Cardiac Studies & Procedures   ______________________________________________________________________________________________ CARDIAC CATHETERIZATION  CARDIAC CATHETERIZATION 08/26/2020  Conclusion   Mid Cx lesion is 60% stenosed.   Prox Cx to Mid Cx lesion is 50% stenosed.   Mid LAD lesion is 100% stenosed.   Dist LAD lesion is 100% stenosed.   The  left ventricular systolic function is normal.   LV end diastolic pressure is normal.   The left ventricular ejection fraction is 55-65% by visual estimate.   There is no aortic valve stenosis.  Chronic total occlusion of the mid LAD.  There may be a distal LAD occlusion as well.  Collaterals fill the mid to distal LAD.  Medical therapy.  Sclerotic spine lesion currently under investigation.  Findings Coronary  Findings Diagnostic  Dominance: Right  Left Anterior Descending Collaterals Dist LAD filled by collaterals from RPDA.  Collaterals Dist LAD filled by collaterals from RPDA.  Mid LAD lesion is 100% stenosed. The lesion is chronically occluded with right-to-left and left-to-left collateral flow. Dist LAD lesion is 100% stenosed. The lesion is chronically occluded.  Second Septal Branch Collaterals 2nd Sept filled by collaterals from 1st Sept.  Left Circumflex Prox Cx to Mid Cx lesion is 50% stenosed. Mid Cx lesion is 60% stenosed.  Right Coronary Artery Vessel is large. The vessel exhibits minimal luminal irregularities.  Intervention  No interventions have been documented.     ECHOCARDIOGRAM  ECHOCARDIOGRAM COMPLETE 08/14/2020  Narrative ECHOCARDIOGRAM REPORT    Patient Name:   Mario Burns Date of Exam: 08/14/2020 Medical Rec #:  [id number]       Height:       69.0 in Accession #:    [id number]      Weight:       176.6 lb Date of Birth:  01-14-1945        BSA:          1.960 m Patient Age:    75 years        BP:           162/98 mmHg Patient Gender: M               HR:           61 bpm. Exam Location:  [address]  Procedure: 2D Echo, Cardiac Doppler and Color Doppler  Indications:    R06.00 Dyspnea  History:        Patient has no prior history of Echocardiogram examinations. Risk Factors:Hypertension and Diabetes. Obstructive sleep apnea. Shortness of breath.  Sonographer:    Carl Rodgers-Jones RDCS Referring Phys: [id number] CHRISTOPHER L SCHUMANN  IMPRESSIONS   1. Left ventricular ejection fraction, by estimation, is 50 to 55%. The left ventricle has low normal function. The left ventricle has no regional wall motion abnormalities. Left ventricular diastolic parameters are consistent with Grade I diastolic dysfunction (impaired relaxation). 2. Right ventricular systolic function is normal. The right ventricular size is normal. 3. The mitral valve  is normal in structure. Trivial mitral valve regurgitation. No evidence of mitral stenosis. 4. The aortic valve is tricuspid. Aortic valve regurgitation is trivial. Mild aortic valve sclerosis is present, with no evidence of aortic valve stenosis. 5. Aortic dilatation noted. There is mild dilatation of the ascending aorta, measuring 41 mm.  FINDINGS Left Ventricle: Left ventricular ejection fraction, by estimation, is 50 to 55%. The left ventricle has low normal function. The left ventricle has no regional wall motion abnormalities. The left ventricular internal cavity size was normal in size. There is no left ventricular hypertrophy. Left ventricular diastolic parameters are consistent with Grade I diastolic dysfunction (impaired relaxation).  Right Ventricle: The right ventricular size is normal. Right ventricular systolic function is normal.  Left Atrium: Left atrial size was normal in size.  Right Atrium: Right atrial size was normal in size.  Pericardium: There is no evidence of pericardial effusion.  Mitral Valve: The mitral valve is normal in structure. Trivial mitral valve regurgitation. No evidence of mitral valve stenosis.  Tricuspid Valve: The tricuspid valve is normal in structure. Tricuspid valve regurgitation is mild . No evidence of tricuspid stenosis.  Aortic Valve: The aortic valve is tricuspid. Aortic valve regurgitation is trivial. Mild aortic valve sclerosis is present, with no evidence of aortic valve stenosis.  Pulmonic Valve: The pulmonic valve was normal in structure. Pulmonic valve regurgitation is trivial. No evidence of pulmonic stenosis.  Aorta: Aortic dilatation noted. There is mild dilatation of the ascending aorta, measuring 41 mm.  Venous: The inferior vena cava was not well visualized.  IAS/Shunts: The interatrial septum appears to be lipomatous. No atrial level shunt detected by color flow Doppler.   LEFT VENTRICLE PLAX 2D LVIDd:         4.10 cm   Diastology LVIDs:         2.90 cm  LV e' medial:    6.42 cm/s LV PW:         0.90 cm  LV E/e' medial:  10.5 LV IVS:        0.90 cm  LV e' lateral:   6.31 cm/s LVOT diam:     1.90 cm  LV E/e' lateral: 10.7 LV SV:         66 LV SV Index:   33 LVOT Area:     2.84 cm   RIGHT VENTRICLE RV Basal diam:  4.60 cm RV S prime:     13.40 cm/s TAPSE (M-mode): 2.1 cm  LEFT ATRIUM             Index       RIGHT ATRIUM           Index LA diam:        4.00 cm 2.04 cm/m  RA Area:     14.20 cm LA Vol (A2C):   53.8 ml 27.45 ml/m RA Volume:   39.40 ml  20.11 ml/m LA Vol (A4C):   41.9 ml 21.38 ml/m LA Biplane Vol: 48.4 ml 24.70 ml/m AORTIC VALVE LVOT Vmax:   111.50 cm/s LVOT Vmean:  71.700 cm/s LVOT VTI:    0.232 m  AORTA Ao Root diam: 3.80 cm Ao Asc diam:  4.30 cm  MITRAL VALVE               TRICUSPID VALVE MV Area (PHT): 2.91 cm    TR Peak grad:   24.6 mmHg MV Decel Time: 261 msec    TR Vmax:        248.00 cm/s MV E velocity: 67.70 cm/s MV A velocity: 93.80 cm/s  SHUNTS MV E/A ratio:  0.72        Systemic VTI:  0.23 m Systemic Diam: 1.90 cm  Redell Shallow MD Electronically signed by Redell Shallow MD Signature Date/Time: 08/14/2020/12:42:43 PM    Final    MONITORS  LONG TERM MONITOR (3-14 DAYS) 04/21/2022  Narrative Patch Wear Time:  13 days and 20 hours (2024-03-25T11:38:49-0400 to 2024-04-08T08:00:11-398)  Patient had a min HR of 22 bpm, max HR of 143 bpm, and avg HR of 66 bpm. Predominant underlying rhythm was Sinus Rhythm.  Nighttime sinus bradycardia rate of 40 to 45 bpm was noted.  There were 51 triggered events all sinus rhythm frequently associated with PVCs more than APCs and rarely with frequent PVCs.  12 Supraventricular Tachycardia runs occurred, the run with the fastest  interval lasting 4 beats with a max rate of 143 bpm, the longest lasting 14 beats with an avg rate of 95 bpm.  Nocturnal second Degree AV Block-Mobitz I (Wenckebach) was present.  There were  no pauses of 3 seconds or greater and no episodes of higher degree AV block.  Isolated SVEs were rare (<1.0%), SVE Couplets were rare (<1.0%), and SVE Triplets were rare (<1.0%).  There were no episodes of atrial fibrillation or flutter.  Isolated VEs were rare (<1.0%), VE Couplets were rare (<1.0%), and no VE Triplets were present. Ventricular Trigeminy was present.  There is 1 7 beat run of PVCs noted at a slow rate.   CT SCANS  CT CORONARY MORPH W/CTA COR W/SCORE 08/15/2020  Addendum 08/15/2020  2:34 PM ADDENDUM REPORT: 08/15/2020 14:32  HISTORY: Chest pain, nonspecific  EXAM: Cardiac/Coronary  CT  TECHNIQUE: The patient was scanned on a [employer].  PROTOCOL: A 120 kV prospective scan was triggered in the descending thoracic aorta at 111 HU's. Axial non-contrast 3 mm slices were carried out through the heart. The data set was analyzed on a dedicated work station and scored using the Agatston method. Gantry rotation speed was 250 msecs and collimation was .6 mm. Beta blockade and 0.8 mg of sl NTG was given. The 3D data set was reconstructed in 5% intervals of the 35-75 % of the R-R cycle. Systolic and diastolic phases were analyzed on a dedicated work station using MPR, MIP and VRT modes. The patient received 100mL OMNIPAQUE  IOHEXOL  350 MG/ML SOLN contrast.  FINDINGS: Image quality: Good.  Noise artifact is: Mild misregistration.  Coronary calcium  score is 0.  Coronary arteries: Normal coronary origins.  Right dominance.  Right Coronary Artery: Minimal atherosclerotic plaque in the mid RCA, <25% stenosis.  Left Main Coronary Artery: No detectable plaque or stenosis.  Left Anterior Descending Coronary Artery: Probable total occlusion of the mid LAD just beyond the branch point of the first septal perforator and first diagonal artery, 100% stenosis with atherosclerotic plaque. Possible channel of flow with perfusion of distal LAD vs collateral flow from  left to left collaterals.  Left Circumflex Artery: Minimal atherosclerotic plaque in the proximal L circumflex, <25% stenosis.  Aorta:  Measurements made in double oblique technique:  Sinus of Valsalva (sinus to sinus):  R-L: 40 mm  L-Non: 40 mm  R-Non: 38 mm  ST junction: 32 mm  Mid ascending aorta (at PA bifurcation): 38 mm  Mid descending aorta (at level of pulmonary veins): 27 mm  Distal descending aorta (just proximal to aortic hiatus): 25 mm  Aortic Valve: No calcifications.  Other findings:  Normal pulmonary vein drainage into the left atrium.  Normal left atrial appendage without a thrombus.  Normal size of the pulmonary artery.  IMPRESSION: 1. Severe CAD with probable total occlusion of the mid LAD, CADRADS = 5.  2. Coronary calcium  score of 0.  3. Normal coronary origin with right dominance.  4.  Mild ascending aorta dilation, 40 mm at sinus of Valsalva  RECOMMENDATIONS Consider cardiac catheterization for mid LAD occlusion.   Electronically Signed By: Soyla Merck M.D. On: 08/15/2020 14:32  Narrative EXAM: OVER-READ INTERPRETATION  CT CHEST  The following report is an over-read performed by radiologist Dr. Marcey Moan of [facility] on 08/15/2020. This over-read does not include interpretation of cardiac or coronary anatomy or pathology. The coronary CTA interpretation by the cardiologist is attached.  COMPARISON:  CT of the chest on 03/05/2011  FINDINGS:  Vascular: No significant noncardiac vascular findings.  Mediastinum/Nodes: Visualized mediastinum and hilar regions demonstrate no lymphadenopathy or masses. Small hiatal hernia present.  Lungs/Pleura: Visualized lungs show no evidence of pulmonary edema, consolidation, pneumothorax, nodule or pleural fluid.  Upper Abdomen: No acute abnormality.  Musculoskeletal: Rounded area of sclerosis measuring up to 18 mm is identified in the T8 vertebral body just to  the right of midline and was not definitely present on the 2013 chest CT. This is not associated with fracture. No other sclerotic or lytic bone lesions identified in visualized bony structures.  IMPRESSION: 1. Rounded area of sclerosis measuring roughly 1.8 cm in greatest diameter within the T8 vertebral body which was not present on a 2013 chest CT. The patient does have a history of prior prostate carcinoma and this may represent a focus of metastatic disease. It is unclear whether this is active metastatic disease and correlation suggested initially with PSA level. Referral to the patient's urologist is recommended. 2. Small hiatal hernia.  Electronically Signed: By: Marcey Moan M.D. On: 08/15/2020 12:30     ______________________________________________________________________________________________      Risk Assessment/Calculations      Physical Exam VS:  BP (!) 148/78   Pulse 64   Ht 5' 10 (1.778 m)   Wt 186 lb 6.4 oz (84.6 kg)   SpO2 96%   BMI 26.75 kg/m        Wt Readings from Last 3 Encounters:  09/13/23 186 lb 6.4 oz (84.6 kg)  08/10/23 185 lb (83.9 kg)  07/18/23 180 lb (81.6 kg)    GEN: Well nourished, well developed in no acute distress NECK: No JVD; No carotid bruits CARDIAC: RRR, no murmurs, rubs, gallops RESPIRATORY:  Clear to auscultation without rales, wheezing or rhonchi  ABDOMEN: Soft, non-tender, non-distended EXTREMITIES: Legs are edematous but not pitting; No deformity   ASSESSMENT AND PLAN CAD -known CTO of the mid LAD, previously been on isosorbide  for chest pain but was not able to tolerate this.  He denies any further episodes of chest pain, continue Norvasc  7.5 mg daily, aspirin  81 mg daily, nitroglycerin  as needed  Palpitations -currently quiescent for him.  Hypertension-blood pressure is elevated in the office today 150/86, he states he previously kept checking it at home and it was typically better controlled.  I will have him  keep a blood pressure log for 2 weeks, he mentions he is going to see his PCP in 2 weeks so they can add the blood pressure log to their office and  to see if any further changes need to be made at that time for now, continue Norvasc  7.5 mg daily, losartan  100 mg daily.  Dyslipidemia-recently tried him on the Nexletol  however he was unable to tolerate this, he has been intolerant of all Perlov other medications as well secondary to his myasthenia gravis.  Discussed referring him to our Pharm.D. for assistance however he would like to follow-up with his PCP for now.    Myasthenia gravis-follows with neurologist in [place].  Currently stable        Dispo: Blood pressure log for 2 weeks, follow-up with general cardiology in 1 year.  Signed, Delon JAYSON Hoover, NP  "

## 2023-09-13 ENCOUNTER — Encounter: Payer: Self-pay | Admitting: Cardiology

## 2023-09-13 ENCOUNTER — Ambulatory Visit: Attending: Cardiology | Admitting: Cardiology

## 2023-09-13 VITALS — BP 148/78 | HR 64 | Ht 70.0 in | Wt 186.4 lb

## 2023-09-13 DIAGNOSIS — E782 Mixed hyperlipidemia: Secondary | ICD-10-CM | POA: Diagnosis not present

## 2023-09-13 DIAGNOSIS — R002 Palpitations: Secondary | ICD-10-CM

## 2023-09-13 DIAGNOSIS — I10 Essential (primary) hypertension: Secondary | ICD-10-CM | POA: Diagnosis not present

## 2023-09-13 DIAGNOSIS — Z789 Other specified health status: Secondary | ICD-10-CM

## 2023-09-13 DIAGNOSIS — I2510 Atherosclerotic heart disease of native coronary artery without angina pectoris: Secondary | ICD-10-CM | POA: Diagnosis not present

## 2023-09-13 MED ORDER — NITROGLYCERIN 0.4 MG SL SUBL: 0.4000 mg | SUBLINGUAL_TABLET | SUBLINGUAL | 3 refills | Status: AC | PRN

## 2023-09-13 NOTE — Patient Instructions (Signed)

## 2023-09-20 DIAGNOSIS — I2510 Atherosclerotic heart disease of native coronary artery without angina pectoris: Secondary | ICD-10-CM | POA: Diagnosis not present

## 2023-09-20 DIAGNOSIS — E890 Postprocedural hypothyroidism: Secondary | ICD-10-CM | POA: Diagnosis not present

## 2023-09-20 DIAGNOSIS — M170 Bilateral primary osteoarthritis of knee: Secondary | ICD-10-CM | POA: Diagnosis not present

## 2023-09-20 DIAGNOSIS — E1149 Type 2 diabetes mellitus with other diabetic neurological complication: Secondary | ICD-10-CM | POA: Diagnosis not present

## 2023-09-20 DIAGNOSIS — E7800 Pure hypercholesterolemia, unspecified: Secondary | ICD-10-CM | POA: Diagnosis not present

## 2023-09-20 DIAGNOSIS — I10 Essential (primary) hypertension: Secondary | ICD-10-CM | POA: Diagnosis not present

## 2023-09-20 DIAGNOSIS — G7000 Myasthenia gravis without (acute) exacerbation: Secondary | ICD-10-CM | POA: Diagnosis not present

## 2023-09-20 DIAGNOSIS — R809 Proteinuria, unspecified: Secondary | ICD-10-CM | POA: Diagnosis not present

## 2023-09-21 DIAGNOSIS — G7000 Myasthenia gravis without (acute) exacerbation: Secondary | ICD-10-CM | POA: Diagnosis not present

## 2023-09-21 DIAGNOSIS — R809 Proteinuria, unspecified: Secondary | ICD-10-CM | POA: Diagnosis not present

## 2023-09-21 DIAGNOSIS — I129 Hypertensive chronic kidney disease with stage 1 through stage 4 chronic kidney disease, or unspecified chronic kidney disease: Secondary | ICD-10-CM | POA: Diagnosis not present

## 2023-09-21 DIAGNOSIS — N182 Chronic kidney disease, stage 2 (mild): Secondary | ICD-10-CM | POA: Diagnosis not present

## 2023-09-21 DIAGNOSIS — E1122 Type 2 diabetes mellitus with diabetic chronic kidney disease: Secondary | ICD-10-CM | POA: Diagnosis not present

## 2023-09-22 DIAGNOSIS — M2352 Chronic instability of knee, left knee: Secondary | ICD-10-CM | POA: Diagnosis not present

## 2023-09-22 DIAGNOSIS — M170 Bilateral primary osteoarthritis of knee: Secondary | ICD-10-CM | POA: Diagnosis not present

## 2023-10-13 ENCOUNTER — Telehealth: Payer: Self-pay | Admitting: *Deleted

## 2023-10-13 ENCOUNTER — Telehealth: Payer: Self-pay

## 2023-10-13 NOTE — Telephone Encounter (Signed)
 Mario Burns,  Mr. Lofton is requesting preoperative cardiac evaluation for left total knee arthroplasty.  He was seen by you in clinic on 09/13/2023.  He was doing well at that time.  He did note some shortness of breath that had been present for some time.  He denied chest pain palpitations dyspnea excetra.  Follow-up was planned for 1 year.  Would you be able to comment on cardiac risk for upcoming procedure?  Thank you for your help.  Please direct your response to CV DIV preop pool.  Josefa HERO. Deziah Renwick NP-C     10/13/2023, 4:46 PM [facility] [address] [place], [place] [address] Office [phone]

## 2023-10-13 NOTE — Telephone Encounter (Signed)
   Pre-operative Risk Assessment    Patient Name: Mario Burns  DOB: 1945-09-23 MRN: [id number]   Date of last office visit: 09/13/23 DELON HOOVER, NP Date of next office visit: NONE   Request for Surgical Clearance    Procedure:  LEFT TOTAL KNEE ARTHROPLASTY  Date of Surgery:  Clearance 01/09/24                                Surgeon:  DR DEMPSEY MOAN Surgeon's Group or Practice Name:  [facility] Phone number:  [phone] Fax number:  [phone]  ATTN: KERRI MAZE   Type of Clearance Requested:   - Medical  - Pharmacy:  Hold Aspirin      Type of Anesthesia:  Spinal   Additional requests/questions:    SignedLucie DELENA Ku   10/13/2023, 5:36 PM

## 2023-10-13 NOTE — Telephone Encounter (Signed)
   Pre-operative Risk Assessment    Patient Name: Mario Burns  DOB: 1945-12-25 MRN: [id number]   Date of last office visit: 09/13/2023 Date of next office visit: N/A   Request for Surgical Clearance    Procedure:  LEFT TOTAL KNEE ARTHROPLASTY  Date of Surgery:  Clearance 01/09/24                                Surgeon:  DR. DEMPSEY MOAN Surgeon's Group or Practice Name:  [facility] Phone number:  [phone] Fax number:  [phone]   Type of Clearance Requested:   - Medical  - Pharmacy:  Hold Aspirin  NOT INDICATED   Type of Anesthesia:  Spinal   Additional requests/questions:    SignedApolinar Essex   10/13/2023, 4:23 PM

## 2023-10-14 NOTE — Telephone Encounter (Signed)
   Patient Name: Mario Burns  DOB: 12-May-1945 MRN: [id number]  Primary Cardiologist: Redell Leiter, MD  Chart reviewed as part of pre-operative protocol coverage. Given past medical history and time since last visit, based on ACC/AHA guidelines, [name] is at acceptable risk for the planned procedure without further cardiovascular testing. He is able to meet greater than 4 METS of physical activity and is optimized from a cardiac perspective.   Per office protocol, if patient is without any new symptoms or concerns at the time of their virtual visit, he may hold ASA for 7 days prior to procedure. Please resume ASA as soon as possible postprocedure, at the discretion of the surgeon.    The patient was advised that if he develops new symptoms prior to surgery to contact our office to arrange for a follow-up visit, and he verbalized understanding.  I will route this recommendation to the requesting party via Epic fax function and remove from pre-op pool.  Please call with questions.  Lamarr Satterfield, NP 10/14/2023, 8:15 AM

## 2023-10-20 DIAGNOSIS — H26493 Other secondary cataract, bilateral: Secondary | ICD-10-CM | POA: Diagnosis not present

## 2023-10-20 DIAGNOSIS — H43391 Other vitreous opacities, right eye: Secondary | ICD-10-CM | POA: Diagnosis not present

## 2023-10-20 DIAGNOSIS — E113293 Type 2 diabetes mellitus with mild nonproliferative diabetic retinopathy without macular edema, bilateral: Secondary | ICD-10-CM | POA: Diagnosis not present

## 2023-12-01 ENCOUNTER — Other Ambulatory Visit: Payer: Self-pay | Admitting: Neurology

## 2023-12-02 ENCOUNTER — Other Ambulatory Visit: Payer: Self-pay

## 2023-12-08 ENCOUNTER — Telehealth: Payer: Self-pay | Admitting: Neurology

## 2023-12-08 MED ORDER — PYRIDOSTIGMINE BROMIDE 60 MG PO TABS: 30.0000 mg | ORAL_TABLET | Freq: Three times a day (TID) | ORAL | 3 refills | Status: AC

## 2023-12-08 NOTE — Telephone Encounter (Signed)
 Wife has called to report that Mario Burns has informed her that the refill for the pyridostigmine  has not been authorized, she states pt is down to just a little over a weeks worth, please call to discuss reason for denial, she can be reached at [phone]. Pt's 3 mo f/u has been r/s

## 2023-12-08 NOTE — Addendum Note (Signed)
 Addended by: ONEITA HOIST E on: 12/08/2023 09:00 AM   Modules accepted: Orders

## 2023-12-08 NOTE — Telephone Encounter (Signed)
 60mg  refused because based on note should be 30mg  bid. How do you wish to proceed since spouse is asking for refill?   , despite multiple effort of changes, he remains on low-dose Imuran  50 mg bid, mestinon  30mg  bid,

## 2023-12-13 NOTE — Telephone Encounter (Signed)
 Error

## 2023-12-20 NOTE — H&P (Signed)
 TOTAL KNEE ADMISSION H&P  Patient is being admitted for left total knee arthroplasty.  Subjective:  Chief Complaint: Left knee pain.  HPI: Mario Burns, 78 y.o. male has a history of pain and functional disability in the left knee due to trauma and has failed non-surgical conservative treatments for greater than 12 weeks to include NSAID's and/or analgesics, corticosteriod injections, viscosupplementation injections, flexibility and strengthening excercises, and activity modification. Onset of symptoms was gradual, starting several years ago with gradually worsening course since that time. The patient noted no past surgery on the left knee.  Patient currently rates pain in the left knee at 8 out of 10 with activity. Patient has night pain, worsening of pain with activity and weight bearing, pain that interferes with activities of daily living, crepitus, and joint swelling. Patient has evidence of  demonstrate bone-on-bone arthritis in the medial and patellofemoral compartments of both knees with varus deformity bilaterally. Large osteophytes are present throughout both knees.There is no active infection.  Patient Active Problem List   Diagnosis Date Noted   Adverse reaction to statin medication    Aortic atherosclerosis    Arthritis    Borderline hyperlipidemia    Claustrophobia    Complication of anesthesia    Degenerative joint disease of cervical spine    Diabetes with neurologic complications (HCC)    GERD (gastroesophageal reflux disease)    H/O hiatal hernia    History of thyroid  nodule    Hypercholesterolemia    Hypothyroidism, postsurgical    Mixed incontinence    Osteoarthritis of right knee    Shortness of breath    Situational anxiety    Statin intolerance    Testosterone deficiency    Type 2 diabetes mellitus (HCC)    Weak urinary stream    Arthralgia of left knee 06/08/2023   Arthralgia of right knee 06/08/2023   Right hand paresthesia 11/2022   Slow transit  constipation 06/29/2022   Rectal bleeding 06/29/2022   Hypothyroidism 06/29/2022   Hemorrhoids 06/29/2022   Hematochezia 06/29/2022   Gastro-esophageal reflux disease with esophagitis 06/29/2022   Coronary artery disease 06/29/2022   Weight decreased 06/29/2022   Right sided abdominal pain 03/03/2022   Acute prostatitis 03/03/2022   Arthralgia of multiple joints 03/03/2022   At risk for falling 03/03/2022   B12 deficiency 03/03/2022   Benign enlargement of prostate 03/03/2022   Body mass index 29.0-29.9, adult 03/03/2022   Breathlessness on exertion 03/03/2022   CA of prostate (HCC) 03/03/2022   Dizziness 03/03/2022   ED (erectile dysfunction) of organic origin 03/03/2022   Chest pain 03/03/2022   Esophageal reflux 03/03/2022   Excessive urination at night 03/03/2022   Facial weakness 03/03/2022   Gonalgia 03/03/2022   History of colonic polyps 03/03/2022   Hypotestosteronism 03/03/2022   Muscle spasms of neck 03/03/2022   Obstructive sleep apnea 03/03/2022   Skin lesion 03/03/2022   Weakness 06/08/2021   Myasthenia gravis (HCC) 01/08/2021   High risk medication use 01/08/2021   Abnormal cardiac CT angiography    Myasthenia gravis with exacerbation (HCC) 10/26/2017   Pain in right hip 04/26/2016   Chronic right-sided low back pain with right-sided sciatica 04/26/2016   Hyperlipidemia 06/26/2014   Essential hypertension, benign 06/26/2014   Incisional hernia with obstruction 04/23/2013   Incisional hernia, without obstruction or gangrene 01/30/2013   Bladder neck contracture 08/02/2012   Diabetes mellitus (HCC) 12/16/2011    Past Medical History:  Diagnosis Date   Abnormal cardiac CT angiography  Acute prostatitis 03/03/2022   Adverse reaction to statin medication    Aortic atherosclerosis    Arthralgia of left knee 06/08/2023   Arthralgia of multiple joints 03/03/2022   Arthralgia of right knee 06/08/2023   Arthritis    At risk for falling 03/03/2022   B12  deficiency 03/03/2022   Benign enlargement of prostate 03/03/2022   Bladder neck contracture    Body mass index 29.0-29.9, adult 03/03/2022   Borderline hyperlipidemia    Breathlessness on exertion 03/03/2022   CA of prostate (HCC) 03/03/2022   urologist Dr. Alline, s/p radical prostatectomy   Chest pain 03/03/2022   Chronic right-sided low back pain with right-sided sciatica 04/26/2016   Claustrophobia    Complication of anesthesia    CLAUSTROPHOBIC   Coronary artery disease 06/29/2022   Degenerative joint disease of cervical spine    Diabetes mellitus (HCC) 12/16/2011   Diabetes with neurologic complications (HCC)    Diaphragmatic hernia 06/29/2022   Dizziness 03/03/2022   ED (erectile dysfunction) of organic origin 03/03/2022   Esophageal reflux 03/03/2022   Essential hypertension, benign 06/26/2014   Excessive urination at night 03/03/2022   Facial weakness 03/03/2022   Gastro-esophageal reflux disease with esophagitis 06/29/2022   GERD (gastroesophageal reflux disease)    Gonalgia 03/03/2022   H/O hiatal hernia    Hematochezia 06/29/2022   Hemorrhoids 06/29/2022   High risk medication use 01/08/2021   History of colonic polyps 03/03/2022   colonoscopy 07/01/10     History of thyroid  nodule    LARGE GOITER   Hypercholesterolemia    Hyperlipidemia 06/26/2014   Hypotestosteronism 03/03/2022   Hypothyroidism 06/29/2022   Hypothyroidism, postsurgical    Incisional hernia with obstruction 04/23/2013   Incisional hernia, without obstruction or gangrene 01/30/2013   Mixed incontinence    s/p prostatectomy   Muscle spasms of neck 03/03/2022   Myasthenia gravis (HCC) 01/08/2021   Myasthenia gravis with exacerbation (HCC) 10/26/2017   Obstructive sleep apnea 03/03/2022   intolerant of CPAP   Osteoarthritis of right knee    Pain in right hip 04/26/2016   Rectal bleeding 06/29/2022   Right hand paresthesia 11/2022   s/p dorsal lacertion between 1st & 2nd fingers   Right  sided abdominal pain 03/03/2022   Shortness of breath    ALWAYS--HAD FOR YEARS - STATES HE CAN'T DO HARDLY ANYTHING WITHOUT GIVING OUT   Situational anxiety    Skin lesion 03/03/2022   Slow transit constipation 06/29/2022   Statin intolerance    Statin intolerance    due to myasthenia gravis   Testosterone deficiency    Type 2 diabetes mellitus (HCC)    Weak urinary stream    Weakness 06/08/2021   Weight decreased 06/29/2022    Past Surgical History:  Procedure Laterality Date   CATARACT EXTRACTION W/ INTRAOCULAR LENS IMPLANT Right 11/14/2018   CYSTOSCOPY N/A 07/30/2013   Procedure: CYSTOSCOPY WITH MERRILL HOMER ;  Surgeon: Alm GORMAN Alline, MD;  Location: [facility];  Service: Urology;  Laterality: N/A;   CYSTOSCOPY WITH URETHRAL DILATATION N/A 08/02/2012   Procedure: CYSTOSCOPY WITH URETHRAL DILATATION, AN REMOVAL STAPLE FROM BLADDER NECK;  Surgeon: Alm GORMAN Alline, MD;  Location: [facility];  Service: Urology;  Laterality: N/A;   HEMORRHOID SURGERY  09/04/1988   INCISIONAL HERNIA REPAIR N/A 04/23/2013   Procedure: LAPAROSCOPIC INCISIONAL HERNIA;  Surgeon: Elon CHRISTELLA Pacini, MD;  Location: [facility];  Service: General;  Laterality: N/A;   INSERTION OF MESH N/A 04/23/2013  Procedure: INSERTION OF MESH;  Surgeon: Elon CHRISTELLA Pacini, MD;  Location: [facility];  Service: General;  Laterality: N/A;   LEFT HEART CATH AND CORONARY ANGIOGRAPHY N/A 08/26/2020   Procedure: LEFT HEART CATH AND CORONARY ANGIOGRAPHY;  Surgeon: Dann Candyce RAMAN, MD;  Location: [facility] INVASIVE CV [facility];  Service: Cardiovascular;  Laterality: N/A;   ROBOT ASSISTED LAPAROSCOPIC RADICAL PROSTATECTOMY  12/15/2011   Procedure: ROBOTIC ASSISTED LAPAROSCOPIC RADICAL PROSTATECTOMY;  Surgeon: Alm RAMAN Fragmin, MD;  Location: [facility];  Service: Urology;  Laterality: N/A;   TOTAL THYROIDECTOMY  05/24/2003    Prior to Admission medications  Medication Sig Start Date End Date Taking? Authorizing Provider   acetaminophen  (TYLENOL ) 500 MG tablet Take 500 mg by mouth every 6 (six) hours as needed for mild pain or moderate pain.    [provider]  amLODipine  (NORVASC ) 2.5 MG tablet Take 1 tablet (2.5 mg total) by mouth daily. 08/10/23 11/08/23  Carlin Delon BROCKS, NP  amLODipine  (NORVASC ) 5 MG tablet Take 5 mg by mouth daily.    [provider]  aspirin  EC 81 MG tablet Take 1 tablet (81 mg total) by mouth daily. Swallow whole. 03/29/22   Monetta Redell PARAS, MD  azaTHIOprine  (IMURAN ) 50 MG tablet Take 100 mg by mouth daily.    [provider]  furosemide (LASIX) 20 MG tablet Take 20 mg by mouth daily as needed for fluid or edema.    [provider]  glimepiride  (AMARYL ) 4 MG tablet Take 4 mg by mouth 2 (two) times daily.    [provider]  HUMALOG KWIKPEN 100 UNIT/ML KwikPen Inject 5 Units into the skin 2 (two) times daily. 07/18/23   [provider]  levothyroxine  (SYNTHROID ) 175 MCG tablet Take 175 mcg by mouth daily before breakfast. 03/21/19   [provider]  losartan (COZAAR) 100 MG tablet Take 100 mg by mouth daily. 03/21/19   [provider]  naproxen sodium (ALEVE) 220 MG tablet Take 220 mg by mouth daily as needed (pain).    [provider]  nitroGLYCERIN  (NITROSTAT ) 0.4 MG SL tablet Place 1 tablet (0.4 mg total) under the tongue every 5 (five) minutes as needed. 09/13/23 12/12/23  Carlin Delon BROCKS, NP  omeprazole (PRILOSEC) 20 MG capsule Take 20 mg by mouth every evening.     [provider]  pyridostigmine  (MESTINON ) 60 MG tablet Take 0.5 tablets (30 mg total) by mouth 3 (three) times daily. 12/08/23   Onita Duos, MD  TOUJEO  MAX SOLOSTAR 300 UNIT/ML SOPN Inject 26 Units into the skin at bedtime. Increase until fasting glucose is 100-140 04/22/18   [provider]    Allergies[1]  Social History   Socioeconomic History   Marital status: Married    Spouse name: Doris   Number of children: 1   Years of  education: 10th   Highest education level: Not on file  Occupational History   Occupation: semi-retired  Tobacco Use   Smoking status: Never   Smokeless tobacco: Never  Vaping Use   Vaping status: Never Used  Substance and Sexual Activity   Alcohol use: No   Drug use: No   Sexual activity: Not on file  Other Topics Concern   Not on file  Social History Narrative   Lives at home with his wife.   Right-handed.   2-3 cups caffeine per day.   Social Drivers of Health   Tobacco Use: Low Risk (09/13/2023)   Patient History    Smoking Tobacco Use:  Never    Smokeless Tobacco Use: Never    Passive Exposure: Not on file  Financial Resource Strain: Not on file  Food Insecurity: Not on file  Transportation Needs: Not on file  Physical Activity: Not on file  Stress: Not on file  Social Connections: Not on file  Intimate Partner Violence: Not on file  Depression (EYV7-0): Not on file  Alcohol Screen: Not on file  Housing: Not on file  Utilities: Not on file  Health Literacy: Not on file    Tobacco Use: Low Risk (09/13/2023)   Patient History    Smoking Tobacco Use: Never    Smokeless Tobacco Use: Never    Passive Exposure: Not on file   Social History   Substance and Sexual Activity  Alcohol Use No    Family History  Problem Relation Age of Onset   Heart disease Mother    Glaucoma Mother    Hypertension Mother    Colon cancer Father    Diabetes Sister    Diabetes Brother    Diabetes Brother    Cancer Brother     Review of Systems  Constitutional:  Negative for chills and fever.  Respiratory:  Negative for cough.   Cardiovascular:  Negative for chest pain.  Gastrointestinal:  Negative for abdominal pain.  Genitourinary:  Negative for dysuria.  Musculoskeletal:  Positive for joint pain.    Objective:  Physical Exam: - General: Well-developed male, alert, oriented, and in no apparent distress. - Hips: Normal motion with no discomfort. - Left knee: No  effusion, varus deformity, range of motion 5-100 degrees, crepitus on range of motion, tender medial greater than lateral, no instability. - Right knee: No effusion, varus deformity, range of motion 5-100 degrees, crepitus on range of motion, tender medial greater than lateral, no instability.  Imaging Review Radiographs from the last visit, including AP and lateral views of both knees, demonstrate bone-on-bone arthritis in the medial and patellofemoral compartments of both knees with varus deformity bilaterally. Large osteophytes are present throughout both knees.   Assessment/Plan:  End stage arthritis, left knee   The patient history, physical examination, clinical judgment of the provider and imaging studies are consistent with end stage degenerative joint disease of the left knee and total knee arthroplasty is deemed medically necessary. The treatment options including medical management, injection therapy arthroscopy and arthroplasty were discussed at length. The risks and benefits of total knee arthroplasty were presented and reviewed. The risks due to aseptic loosening, infection, stiffness, patella tracking problems, thromboembolic complications and other imponderables were discussed. The patient acknowledged the explanation, agreed to proceed with the plan and consent was signed. Patient is being admitted for inpatient treatment for surgery, pain control, PT, OT, prophylactic antibiotics, VTE prophylaxis, progressive ambulation and ADLs and discharge planning. The patient is planning to be discharged home.   Patient's anticipated LOS is less than 2 midnights, meeting these requirements: - Lives within 1 hour of care - Has a competent adult at home to recover with post-op recover - NO history of  - Chronic pain requiring opioids  - Heart failure  - Heart attack  - Stroke  - DVT/VTE  - Cardiac arrhythmia  - Respiratory Failure/COPD  - Renal failure  - Anemia  - Advanced Liver  disease   Therapy Plans: EO [place]  Disposition: home with wife  Planned DVT Prophylaxis: Xarelto 10mg  QD (hx of Prostate CA) DME Needed: none, has walker PCP: Rankin Dike, PA-C (clearance received)  Cardiology: Redell JINNY Leiter,  MD (clearance received)  TXA: IV  Allergies: canagliflozin (polyuria), HCTZ (headache), insulin  detemir (increase blood sugar), Insulin  glargine (itching, headaches, chest pain), liraglutide  (nausea), Lisinopril (headache), lovastatin (blood in stool), pioglitazone (blood in stool), Sitagliptin (increase blood sugar). Sulfa drugs (unsure rxn)  Metal Allergies: none  Anesthesia Concerns: Hard to wake following anesthesia  BMI: 27.4  Last HgbA1c: 7.5% on 12/11  Pharmacy: [facility] on [address]  Pain regimen: Oxycodone , Tramadol   Other: - hx of prostate cancer  - hold ASA 7 days per cardio  - Patient was instructed on what medications to stop prior to surgery. - Follow-up visit in 2 weeks with Dr. Melodi - Begin physical therapy following surgery - Pre-operative lab work as pre-surgical testing - Prescriptions will be provided in hospital at time of discharge  Waddell Sor, PA-C Orthopedic Surgery [facility] [place]      [1]  Allergies Allergen Reactions   Bydureon  [Exenatide ] Nausea Only    Headache & constipation   Colestid [Colestipol Hcl] Other (See Comments)    Bloating and constipation   Hydrochlorothiazide Other (See Comments)    Headache   Invokana [Canagliflozin] Other (See Comments)    Polyuria   Januvia [Sitagliptin] Other (See Comments)    Raised blood sugar   Levemir [Insulin  Detemir] Other (See Comments)    Raised blood sugar   Lisinopril Other (See Comments)    Headache   Lovastatin Other (See Comments)    Blood in stool   Metformin  And Related Nausea Only    Headache   Nexletol  [Bempedoic Acid ] Diarrhea    Upset stomach   Pioglitazone Other (See Comments)    Blood in stool   Pravastatin Sodium Other (See  Comments)    Sore throat and Headache, felt like he couldn't breathe   Sulfa Antibiotics Other (See Comments)    unknown  Other Reaction(s): stomach upset   Toujeo  Max Solostar [Insulin  Glargine] Itching    Headaches and chest pain   Victoza  [Liraglutide ] Nausea Only    Headache and constipation

## 2024-01-02 NOTE — Progress Notes (Signed)
 Anesthesia Review:  ERE:Gzwwpqzm woody,NP LOV 9/9/235  Cardiologist :  PPM/ ICD: Device Orders: Rep Notified:  Chest x-ray : EKG : 08/10/23  Echo : 2022  Stress test: Monitor-2024  Cardiac Cath :  2022  CT cors- 2022   Activity level:  Sleep Study/ CPAP : Fasting Blood Sugar :      / Checks Blood Sugar -- times a day:     DM- ytpe  Hgba1c-  Amaryl -  Humalog-  Toujeo -   Blood Thinner/ Instructions /Last Dose: ASA / Instructions/ Last Dose :    81 mg aspirin 

## 2024-01-02 NOTE — Patient Instructions (Signed)
 SURGICAL WAITING ROOM VISITATION  Patients having surgery or a procedure may have no more than 2 support people in the waiting area - these visitors may rotate.    Children ages 51 and under will not be able to visit patients in [facility] under most circumstances.   Visitors with respiratory illnesses are discouraged from visiting and should remain at home.  If the patient needs to stay at the hospital during part of their recovery, the visitor guidelines for inpatient rooms apply. Pre-op nurse will coordinate an appropriate time for 1 support person to accompany patient in pre-op.  This support person may not rotate.    Please refer to the [facility] website for the visitor guidelines for Inpatients (after your surgery is over and you are in a regular room).       Your procedure is scheduled on:  01/09/2024    Report to [facility] Main Entrance    Report to admitting at  0700 AM   Call this number if you have problems the morning of surgery [phone]   Do not eat food :After Midnight.   After Midnight you may have the following liquids until ___ 0615___ AM  DAY OF SURGERY  Water  Non-Citrus Juices (without pulp, NO RED-Apple, White grape, White cranberry) Black Coffee (NO MILK/CREAM OR CREAMERS, sugar ok)  Clear Tea (NO MILK/CREAM OR CREAMERS, sugar ok) regular and decaf                             Plain Jell-O (NO RED)                                           Fruit ices (not with fruit pulp, NO RED)                                     Popsicles (NO RED)                                                               Sports drinks like Gatorade (NO RED)                   The day of surgery:  Drink ONE (1) Pre-Surgery Clear Ensure or G2 at  [id number] the morning of surgery. Drink in one sitting. Do not sip.  This drink was given to you during your hospital  pre-op appointment visit. Nothing else to drink after completing the  Pre-Surgery Clear Ensure or  G2.          If you have questions, please contact your surgeons office.       Oral Hygiene is also important to reduce your risk of infection.                                    Remember - BRUSH YOUR TEETH THE MORNING OF SURGERY WITH YOUR REGULAR TOOTHPASTE  DENTURES WILL BE REMOVED PRIOR TO SURGERY PLEASE DO NOT APPLY Poly  grip OR ADHESIVES!!!   Do NOT smoke after Midnight   Stop all vitamins and herbal supplements 7 days before surgery.   Take these medicines the morning of surgery with A SIP OF WATER :  amlodipine , synthroid , mestinon               Amaryl - none nite before and none am of surgery           Humalog-            Toujeo - hs- Take 1/2 dose nite before surgery   DO NOT TAKE ANY ORAL DIABETIC MEDICATIONS DAY OF YOUR SURGERY  Bring CPAP mask and tubing day of surgery.                              You may not have any metal on your body including hair pins, jewelry, and body piercing             Do not wear make-up, lotions, powders, perfumes/cologne, or deodorant  Do not wear nail polish including gel and S&S, artificial/acrylic nails, or any other type of covering on natural nails including finger and toenails. If you have artificial nails, gel coating, etc. that needs to be removed by a nail salon please have this removed prior to surgery or surgery may need to be canceled/ delayed if the surgeon/ anesthesia feels like they are unable to be safely monitored.   Do not shave  48 hours prior to surgery.               Men may shave face and neck.   Do not bring valuables to the hospital. [facility] IS NOT             RESPONSIBLE   FOR VALUABLES.   Contacts, glasses, dentures or bridgework may not be worn into surgery.   Bring small overnight bag day of surgery.   DO NOT BRING YOUR HOME MEDICATIONS TO THE HOSPITAL. PHARMACY WILL DISPENSE MEDICATIONS LISTED ON YOUR MEDICATION LIST TO YOU DURING YOUR ADMISSION IN THE HOSPITAL!    Patients discharged on the day  of surgery will not be allowed to drive home.  Someone NEEDS to stay with you for the first 24 hours after anesthesia.   Special Instructions: Bring a copy of your healthcare power of attorney and living will documents the day of surgery if you haven't scanned them before.              Please read over the following fact sheets you were given: IF YOU HAVE QUESTIONS ABOUT YOUR PRE-OP INSTRUCTIONS PLEASE CALL [phone].   If you received a COVID test during your pre-op visit  it is requested that you wear a mask when out in public, stay away from anyone that may not be feeling well and notify your surgeon if you develop symptoms. If you test positive for Covid or have been in contact with anyone that has tested positive in the last 10 days please notify you surgeon.      Pre-operative 4 CHG Bath Instructions   You can play a key role in reducing the risk of infection after surgery. Your skin needs to be as free of germs as possible. You can reduce the number of germs on your skin by washing with CHG (chlorhexidine gluconate) soap before surgery. CHG is an antiseptic soap that kills germs and continues to kill germs even after washing.   DO NOT use  if you have an allergy to chlorhexidine/CHG or antibacterial soaps. If your skin becomes reddened or irritated, stop using the CHG and notify one of our RNs at [phone].   Please shower with the CHG soap starting 4 days before surgery using the following schedule:     Please keep in mind the following:  DO NOT shave, including legs and underarms, starting the day of your first shower.   You may shave your face at any point before/day of surgery.  Place clean sheets on your bed the day you start using CHG soap. Use a clean washcloth (not used since being washed) for each shower. DO NOT sleep with pets once you start using the CHG.   CHG Shower Instructions:  If you choose to wash your hair and private area, wash first with your normal  shampoo/soap.  After you use shampoo/soap, rinse your hair and body thoroughly to remove shampoo/soap residue.  Turn the water  OFF and apply about 3 tablespoons (45 ml) of CHG soap to a CLEAN washcloth.  Apply CHG soap ONLY FROM YOUR NECK DOWN TO YOUR TOES (washing for 3-5 minutes)  DO NOT use CHG soap on face, private areas, open wounds, or sores.  Pay special attention to the area where your surgery is being performed.  If you are having back surgery, having someone wash your back for you may be helpful. Wait 2 minutes after CHG soap is applied, then you may rinse off the CHG soap.  Pat dry with a clean towel  Put on clean clothes/pajamas   If you choose to wear lotion, please use ONLY the CHG-compatible lotions on the back of this paper.     Additional instructions for the day of surgery: DO NOT APPLY any lotions, deodorants, cologne, or perfumes.   Put on clean/comfortable clothes.  Brush your teeth.  Ask your nurse before applying any prescription medications to the skin.      CHG Compatible Lotions   Aveeno Moisturizing lotion  Cetaphil Moisturizing Cream  Cetaphil Moisturizing Lotion  Clairol Herbal Essence Moisturizing Lotion, Dry Skin  Clairol Herbal Essence Moisturizing Lotion, Extra Dry Skin  Clairol Herbal Essence Moisturizing Lotion, Normal Skin  Curel Age Defying Therapeutic Moisturizing Lotion with Alpha Hydroxy  Curel Extreme Care Body Lotion  Curel Soothing Hands Moisturizing Hand Lotion  Curel Therapeutic Moisturizing Cream, Fragrance-Free  Curel Therapeutic Moisturizing Lotion, Fragrance-Free  Curel Therapeutic Moisturizing Lotion, Original Formula  Eucerin Daily Replenishing Lotion  Eucerin Dry Skin Therapy Plus Alpha Hydroxy Crme  Eucerin Dry Skin Therapy Plus Alpha Hydroxy Lotion  Eucerin Original Crme  Eucerin Original Lotion  Eucerin Plus Crme Eucerin Plus Lotion  Eucerin TriLipid Replenishing Lotion  Keri Anti-Bacterial Hand Lotion  Keri Deep  Conditioning Original Lotion Dry Skin Formula Softly Scented  Keri Deep Conditioning Original Lotion, Fragrance Free Sensitive Skin Formula  Keri Lotion Fast Absorbing Fragrance Free Sensitive Skin Formula  Keri Lotion Fast Absorbing Softly Scented Dry Skin Formula  Keri Original Lotion  Keri Skin Renewal Lotion Keri Silky Smooth Lotion  Keri Silky Smooth Sensitive Skin Lotion  Nivea Body Creamy Conditioning Oil  Nivea Body Extra Enriched Teacher, Adult Education Moisturizing Lotion Nivea Crme  Nivea Skin Firming Lotion  NutraDerm 30 Skin Lotion  NutraDerm Skin Lotion  NutraDerm Therapeutic Skin Cream  NutraDerm Therapeutic Skin Lotion  ProShield Protective Hand Cream  Provon moisturizing lotion

## 2024-01-03 ENCOUNTER — Encounter (HOSPITAL_COMMUNITY)
Admission: RE | Admit: 2024-01-03 | Discharge: 2024-01-03 | Disposition: A | Discharge status: Home / Self Care | Source: Ambulatory Visit | Attending: Orthopedic Surgery | Admitting: Orthopedic Surgery

## 2024-01-03 ENCOUNTER — Other Ambulatory Visit: Payer: Self-pay

## 2024-01-03 ENCOUNTER — Encounter (HOSPITAL_COMMUNITY): Payer: Self-pay

## 2024-01-03 VITALS — BP 138/73 | HR 58 | Temp 98.5°F | Resp 16 | Ht 70.0 in | Wt 172.0 lb

## 2024-01-03 DIAGNOSIS — Z01812 Encounter for preprocedural laboratory examination: Secondary | ICD-10-CM | POA: Diagnosis present

## 2024-01-03 DIAGNOSIS — E139 Other specified diabetes mellitus without complications: Secondary | ICD-10-CM | POA: Insufficient documentation

## 2024-01-03 DIAGNOSIS — M1712 Unilateral primary osteoarthritis, left knee: Secondary | ICD-10-CM | POA: Insufficient documentation

## 2024-01-03 DIAGNOSIS — Z01818 Encounter for other preprocedural examination: Secondary | ICD-10-CM

## 2024-01-03 DIAGNOSIS — F40240 Claustrophobia: Secondary | ICD-10-CM | POA: Insufficient documentation

## 2024-01-03 HISTORY — DX: Headache, unspecified: R51.9

## 2024-01-03 LAB — BASIC METABOLIC PANEL WITH GFR
Anion gap: 10 (ref 5–15)
BUN: 19 mg/dL (ref 8–23)
CO2: 23 mmol/L (ref 22–32)
Calcium: 9.2 mg/dL (ref 8.9–10.3)
Chloride: 103 mmol/L (ref 98–111)
Creatinine, Ser: 1.15 mg/dL (ref 0.61–1.24)
GFR, Estimated: >60 mL/min
Glucose, Bld: 152 mg/dL — ABNORMAL HIGH (ref 70–99)
Potassium: 4 mmol/L (ref 3.5–5.1)
Sodium: 136 mmol/L (ref 135–145)

## 2024-01-03 LAB — CBC
HCT: 37.1 % — ABNORMAL LOW (ref 39.0–52.0)
Hemoglobin: 12.2 g/dL — ABNORMAL LOW (ref 13.0–17.0)
MCH: 30.8 pg (ref 26.0–34.0)
MCHC: 32.9 g/dL (ref 30.0–36.0)
MCV: 93.7 fL (ref 80.0–100.0)
Platelets: 257 K/uL (ref 150–400)
RBC: 3.96 MIL/uL — ABNORMAL LOW (ref 4.22–5.81)
RDW: 13.3 % (ref 11.5–15.5)
WBC: 6 K/uL (ref 4.0–10.5)
nRBC: 0 % (ref 0.0–0.2)

## 2024-01-03 LAB — HEMOGLOBIN A1C
Hgb A1c MFr Bld: 7.4 % — ABNORMAL HIGH (ref 4.8–5.6)
Mean Plasma Glucose: 165.68 mg/dL

## 2024-01-03 LAB — SURGICAL PCR SCREEN
MRSA, PCR: NEGATIVE
Staphylococcus aureus: POSITIVE — AB

## 2024-01-03 LAB — GLUCOSE, CAPILLARY: Glucose-Capillary: 132 mg/dL — ABNORMAL HIGH (ref 70–99)

## 2024-01-03 NOTE — Anesthesia Preprocedure Evaluation (Addendum)
"                                    Anesthesia Evaluation  Patient identified by MRN, date of birth, ID band Patient awake    Reviewed: Allergy & Precautions, NPO status , Patient's Chart, lab work & pertinent test results, reviewed documented beta blocker date and time   History of Anesthesia Complications (+) history of anesthetic complications  Airway Mallampati: III  TM Distance: >3 FB     Dental no notable dental hx.    Pulmonary shortness of breath, sleep apnea    breath sounds clear to auscultation       Cardiovascular hypertension, + CAD   Rhythm:Regular Rate:Normal  IMPRESSIONS     1. Left ventricular ejection fraction, by estimation, is 50 to 55%. The  left ventricle has low normal function. The left ventricle has no regional  wall motion abnormalities. Left ventricular diastolic parameters are  consistent with Grade I diastolic  dysfunction (impaired relaxation).   2. Right ventricular systolic function is normal. The right ventricular  size is normal.   3. The mitral valve is normal in structure. Trivial mitral valve  regurgitation. No evidence of mitral stenosis.   4. The aortic valve is tricuspid. Aortic valve regurgitation is trivial.  Mild aortic valve sclerosis is present, with no evidence of aortic valve  stenosis.   5. Aortic dilatation noted. There is mild dilatation of the ascending  aorta, measuring 41 mm.     Neuro/Psych  Headaches PSYCHIATRIC DISORDERS Anxiety     myasthenia  Neuromuscular disease    GI/Hepatic hiatal hernia,GERD  ,,(+) neg Cirrhosis        Endo/Other  diabetesHypothyroidism    Renal/GU      Musculoskeletal  (+) Arthritis , Osteoarthritis,    Abdominal   Peds  Hematology  (+) Blood dyscrasia, anemia   Anesthesia Other Findings   Reproductive/Obstetrics                              Anesthesia Physical Anesthesia Plan  ASA: 3  Anesthesia Plan: Spinal and MAC    Post-op Pain Management: Regional block*   Induction:   PONV Risk Score and Plan: 1 and Ondansetron  and Propofol  infusion  Airway Management Planned: Simple Face Mask and Natural Airway  Additional Equipment:   Intra-op Plan:   Post-operative Plan: Extubation in OR  Informed Consent: I have reviewed the patients History and Physical, chart, labs and discussed the procedure including the risks, benefits and alternatives for the proposed anesthesia with the patient or authorized representative who has indicated his/her understanding and acceptance.     Dental advisory given  Plan Discussed with: CRNA  Anesthesia Plan Comments: (See PAT note from 12/30)         Anesthesia Quick Evaluation  "

## 2024-01-03 NOTE — Progress Notes (Signed)
 " Case: [id number] Date/Time: 01/09/24 0910   Procedure: ARTHROPLASTY, KNEE, TOTAL (Left: Knee)   Anesthesia type: Spinal   Diagnosis: Primary osteoarthritis of left knee [M17.12]   Pre-op diagnosis: Left knee osteoarthritis   Location[facility] ROOM 09 / [facility]   Surgeons: Melodi Lerner, MD       DISCUSSION: Mario Burns is a 49 male with PMH of HTN, CAD, OSA (CPAP intolerance), DOE, myasthenia gravis, GERD, hiatal hernia, IDDM (A1c 7.4), post procedural hypothyroidism, prostate cancer s/p prostatectomy (2013), arthritis,   Complication of anesthesia includes prolonged emergence, urinary retention. Pt also reports issues with claustrophobia.  Patient follows with Cardiology for hx of CAD with CTO of the mid LAD with collaterals by cath in 2022. Medical therapy recommended at the time. He was last seen on 09/13/23 in clinic. BP noted to be mildly elevated but otherwise patient doing well, without symptoms. BP better controlled in pre op. He was cleared for surgery in 10/10 telephone note by NP Lawrence:  Chart reviewed as part of pre-operative protocol coverage. Given past medical history and time since last visit, based on ACC/AHA guidelines, Mario Burns is at acceptable risk for the planned procedure without further cardiovascular testing. He is able to meet greater than 4 METS of physical activity and is optimized from a cardiac perspective.  Per office protocol, if patient is without any new symptoms or concerns at the time of their virtual visit, he may hold ASA for 7 days prior to procedure. Please resume ASA as soon as possible postprocedure, at the discretion of the surgeon.    Hx of MG and follows with Neurology. Last seen on 01/31/23. Per Dr. Onita MG not optimally controlled but patient hesitant to try new meds. He missed his last follow up but does have follow up scheduled for March 2026. Symptoms include:  Moderate bulbar weakness, mild to moderate limb muscle weakness on examination. He  complains of intermittent chewing swallowing difficulty, dysarthria,even SOB with exertion   VS: BP 138/73   Pulse (!) 58   Temp 36.9 C (Oral)   Resp 16   Ht 5' 10 (1.778 m)   Wt 78 kg   SpO2 100%   BMI 24.68 kg/m   PROVIDERS: Montey Lot, PA-C   LABS: Labs reviewed: Acceptable for surgery. (all labs ordered are listed, but only abnormal results are displayed)  Labs Reviewed  SURGICAL PCR SCREEN - Abnormal; Notable for the following components:      Result Value   Staphylococcus aureus POSITIVE (*)    All other components within normal limits  CBC - Abnormal; Notable for the following components:   RBC 3.96 (*)    Hemoglobin 12.2 (*)    HCT 37.1 (*)    All other components within normal limits  BASIC METABOLIC PANEL WITH GFR - Abnormal; Notable for the following components:   Glucose, Bld 152 (*)    All other components within normal limits  HEMOGLOBIN A1C - Abnormal; Notable for the following components:   Hgb A1c MFr Bld 7.4 (*)    All other components within normal limits  GLUCOSE, CAPILLARY - Abnormal; Notable for the following components:   Glucose-Capillary 132 (*)    All other components within normal limits     EKG 08/10/23:  NSR LVH  Event monitor 04/21/22:  Patch Wear Time:  13 days and 20 hours (2024-03-25T11:38:49-0400 to 2024-04-08T08:00:11-398)   Patient had a min HR of 22 bpm, max HR of 143 bpm, and avg HR  of 66 bpm. Predominant underlying rhythm was Sinus Rhythm.  Nighttime sinus bradycardia rate of 40 to 45 bpm was noted.   There were 51 triggered events all sinus rhythm frequently associated with PVCs more than APCs and rarely with frequent PVCs.   12 Supraventricular Tachycardia runs occurred, the run with the fastest interval lasting 4 beats with a max rate of 143 bpm, the  longest lasting 14 beats with an avg rate of 95 bpm.     Nocturnal second Degree AV Block-Mobitz I (Wenckebach) was present.  There were no pauses of 3 seconds or  greater and no episodes of higher degree AV block.   Isolated SVEs were rare (<1.0%), SVE Couplets were rare (<1.0%), and SVE Triplets were rare (<1.0%).  There were no episodes of atrial fibrillation or flutter.   Isolated VEs were rare (<1.0%), VE Couplets were rare (<1.0%), and no VE Triplets were present. Ventricular Trigeminy was present.  There is 1 7 beat run of PVCs noted at a slow rate.  LHC 08/27/23:     Mid Cx lesion is 60% stenosed.   Prox Cx to Mid Cx lesion is 50% stenosed.   Mid LAD lesion is 100% stenosed.   Dist LAD lesion is 100% stenosed.   The left ventricular systolic function is normal.   LV end diastolic pressure is normal.   The left ventricular ejection fraction is 55-65% by visual estimate.   There is no aortic valve stenosis.   Chronic total occlusion of the mid LAD.  There may be a distal LAD occlusion as well.  Collaterals fill the mid to distal LAD.    Medical therapy.  Sclerotic spine lesion currently under investigation.    Echo 08/14/2020:  IMPRESSIONS    1. Left ventricular ejection fraction, by estimation, is 50 to 55%. The left ventricle has low normal function. The left ventricle has no regional wall motion abnormalities. Left ventricular diastolic parameters are consistent with Grade I diastolic dysfunction (impaired relaxation).  2. Right ventricular systolic function is normal. The right ventricular size is normal.  3. The mitral valve is normal in structure. Trivial mitral valve regurgitation. No evidence of mitral stenosis.  4. The aortic valve is tricuspid. Aortic valve regurgitation is trivial. Mild aortic valve sclerosis is present, with no evidence of aortic valve stenosis.  5. Aortic dilatation noted. There is mild dilatation of the ascending aorta, measuring 41 mm.   Past Medical History:  Diagnosis Date   Abnormal cardiac CT angiography    Acute prostatitis 03/03/2022   Adverse reaction to statin medication    Aortic  atherosclerosis    Arthralgia of left knee 06/08/2023   Arthralgia of multiple joints 03/03/2022   Arthralgia of right knee 06/08/2023   Arthritis    At risk for falling 03/03/2022   B12 deficiency 03/03/2022   Benign enlargement of prostate 03/03/2022   Bladder neck contracture    Body mass index 29.0-29.9, adult 03/03/2022   Borderline hyperlipidemia    Breathlessness on exertion 03/03/2022   CA of prostate (HCC) 03/03/2022   urologist Dr. Alline, s/p radical prostatectomy   Chest pain 03/03/2022   Chronic right-sided low back pain with right-sided sciatica 04/26/2016   Claustrophobia    Complication of anesthesia    claustrophobic and slow to wake up   Coronary artery disease 06/29/2022   Degenerative joint disease of cervical spine    Diabetes mellitus (HCC) 12/16/2011   Diabetes with neurologic complications (HCC)    Diaphragmatic hernia 06/29/2022  Dizziness 03/03/2022   ED (erectile dysfunction) of organic origin 03/03/2022   Esophageal reflux 03/03/2022   Essential hypertension, benign 06/26/2014   Excessive urination at night 03/03/2022   Facial weakness 03/03/2022   Gastro-esophageal reflux disease with esophagitis 06/29/2022   GERD (gastroesophageal reflux disease)    Gonalgia 03/03/2022   H/O hiatal hernia    Headache    Hematochezia 06/29/2022   Hemorrhoids 06/29/2022   High risk medication use 01/08/2021   History of colonic polyps 03/03/2022   colonoscopy 07/01/10     History of thyroid  nodule    LARGE GOITER   Hypercholesterolemia    Hyperlipidemia 06/26/2014   Hypotestosteronism 03/03/2022   Hypothyroidism 06/29/2022   Hypothyroidism, postsurgical    Incisional hernia with obstruction 04/23/2013   Incisional hernia, without obstruction or gangrene 01/30/2013   Mixed incontinence    s/p prostatectomy   Muscle spasms of neck 03/03/2022   Myasthenia gravis (HCC) 01/08/2021   Myasthenia gravis with exacerbation (HCC) 10/26/2017   Obstructive sleep  apnea 03/03/2022   intolerant of CPAP   Osteoarthritis of right knee    Pain in right hip 04/26/2016   Rectal bleeding 06/29/2022   Right hand paresthesia 11/2022   s/p dorsal lacertion between 1st & 2nd fingers   Right sided abdominal pain 03/03/2022   Shortness of breath    ALWAYS--HAD FOR YEARS - STATES HE CAN'T DO HARDLY ANYTHING WITHOUT GIVING OUT   Situational anxiety    Skin lesion 03/03/2022   Slow transit constipation 06/29/2022   Statin intolerance    Statin intolerance    due to myasthenia gravis   Testosterone deficiency    Type 2 diabetes mellitus (HCC)    Weak urinary stream    Weakness 06/08/2021   Weight decreased 06/29/2022    Past Surgical History:  Procedure Laterality Date   CATARACT EXTRACTION W/ INTRAOCULAR LENS IMPLANT Right 11/14/2018   CYSTOSCOPY N/A 07/30/2013   Procedure: CYSTOSCOPY WITH MERRILL HOMER ;  Surgeon: Alm GORMAN Fragmin, MD;  Location: [facility];  Service: Urology;  Laterality: N/A;   CYSTOSCOPY WITH URETHRAL DILATATION N/A 08/02/2012   Procedure: CYSTOSCOPY WITH URETHRAL DILATATION, AN REMOVAL STAPLE FROM BLADDER NECK;  Surgeon: Alm GORMAN Fragmin, MD;  Location[facility];  Service: Urology;  Laterality: N/A;   HEMORRHOID SURGERY  09/04/1988   INCISIONAL HERNIA REPAIR N/A 04/23/2013   Procedure: LAPAROSCOPIC INCISIONAL HERNIA;  Surgeon: Elon CHRISTELLA Pacini, MD;  Location[facility];  Service: General;  Laterality: N/A;   INSERTION OF MESH N/A 04/23/2013   Procedure: INSERTION OF MESH;  Surgeon: Elon CHRISTELLA Pacini, MD;  Location[facility];  Service: General;  Laterality: N/A;   LEFT HEART CATH AND CORONARY ANGIOGRAPHY N/A 08/26/2020   Procedure: LEFT HEART CATH AND CORONARY ANGIOGRAPHY;  Surgeon: Dann Candyce GORMAN, MD;  Location[facility] INVASIVE CV LAB;  Service: Cardiovascular;  Laterality: N/A;   ROBOT ASSISTED LAPAROSCOPIC RADICAL PROSTATECTOMY  12/15/2011   Procedure: ROBOTIC ASSISTED LAPAROSCOPIC RADICAL  PROSTATECTOMY;  Surgeon: Alm GORMAN Fragmin, MD;  Location[facility];  Service: Urology;  Laterality: N/A;   TOTAL THYROIDECTOMY  05/24/2003    MEDICATIONS:  acetaminophen  (TYLENOL ) 500 MG tablet   amLODipine  (NORVASC ) 2.5 MG tablet   amLODipine  (NORVASC ) 5 MG tablet   aspirin  EC 81 MG tablet   azaTHIOprine  (IMURAN ) 50 MG tablet   furosemide (LASIX) 20 MG tablet   glimepiride  (AMARYL ) 4 MG tablet   HUMALOG KWIKPEN 100 UNIT/ML KwikPen   levothyroxine  (SYNTHROID ) 175 MCG  tablet   losartan (COZAAR) 100 MG tablet   naproxen sodium (ALEVE) 220 MG tablet   nitroGLYCERIN  (NITROSTAT ) 0.4 MG SL tablet   omeprazole (PRILOSEC) 20 MG capsule   pyridostigmine  (MESTINON ) 60 MG tablet   rosuvastatin  (CRESTOR ) 5 MG tablet   TOUJEO  MAX SOLOSTAR 300 UNIT/ML SOPN   No current facility-administered medications for this encounter.   Burnard CHRISTELLA Odis DEVONNA [facility] Short Stay/Anesthesiology [facility] Phone [phone] 01/03/2024 2:39 PM      "

## 2024-01-09 ENCOUNTER — Ambulatory Visit (HOSPITAL_COMMUNITY): Admitting: Anesthesiology

## 2024-01-09 ENCOUNTER — Encounter (HOSPITAL_COMMUNITY): Payer: Self-pay | Admitting: Medical

## 2024-01-09 ENCOUNTER — Observation Stay (HOSPITAL_COMMUNITY)
Admission: RE | Admit: 2024-01-09 | Discharge: 2024-01-10 | Disposition: A | Discharge status: Home / Self Care | Source: Ambulatory Visit | Attending: Orthopedic Surgery | Admitting: Orthopedic Surgery

## 2024-01-09 ENCOUNTER — Encounter (HOSPITAL_COMMUNITY): Payer: Self-pay | Admitting: Orthopedic Surgery

## 2024-01-09 ENCOUNTER — Encounter (HOSPITAL_COMMUNITY): Admission: RE | Disposition: A | Payer: Self-pay | Source: Ambulatory Visit | Attending: Orthopedic Surgery

## 2024-01-09 ENCOUNTER — Other Ambulatory Visit: Payer: Self-pay

## 2024-01-09 DIAGNOSIS — Z7982 Long term (current) use of aspirin: Secondary | ICD-10-CM | POA: Diagnosis not present

## 2024-01-09 DIAGNOSIS — M1712 Unilateral primary osteoarthritis, left knee: Principal | ICD-10-CM | POA: Insufficient documentation

## 2024-01-09 DIAGNOSIS — Z79899 Other long term (current) drug therapy: Secondary | ICD-10-CM | POA: Insufficient documentation

## 2024-01-09 DIAGNOSIS — E039 Hypothyroidism, unspecified: Secondary | ICD-10-CM | POA: Insufficient documentation

## 2024-01-09 DIAGNOSIS — I2510 Atherosclerotic heart disease of native coronary artery without angina pectoris: Secondary | ICD-10-CM | POA: Insufficient documentation

## 2024-01-09 DIAGNOSIS — Z01818 Encounter for other preprocedural examination: Secondary | ICD-10-CM

## 2024-01-09 DIAGNOSIS — I10 Essential (primary) hypertension: Secondary | ICD-10-CM

## 2024-01-09 DIAGNOSIS — E139 Other specified diabetes mellitus without complications: Principal | ICD-10-CM

## 2024-01-09 DIAGNOSIS — E119 Type 2 diabetes mellitus without complications: Secondary | ICD-10-CM | POA: Insufficient documentation

## 2024-01-09 DIAGNOSIS — M179 Osteoarthritis of knee, unspecified: Secondary | ICD-10-CM | POA: Diagnosis present

## 2024-01-09 HISTORY — PX: TOTAL KNEE ARTHROPLASTY: SHX125

## 2024-01-09 LAB — GLUCOSE, CAPILLARY
Glucose-Capillary: 128 mg/dL — ABNORMAL HIGH (ref 70–99)
Glucose-Capillary: 133 mg/dL — ABNORMAL HIGH (ref 70–99)
Glucose-Capillary: 385 mg/dL — ABNORMAL HIGH (ref 70–99)
Glucose-Capillary: 275 mg/dL — ABNORMAL HIGH (ref 70–99)

## 2024-01-09 SURGERY — ARTHROPLASTY, KNEE, TOTAL
Anesthesia: Monitor Anesthesia Care | Site: Knee | Laterality: Left

## 2024-01-09 MED ORDER — ACETAMINOPHEN 10 MG/ML IV SOLN: 1000.0000 mg | Freq: Once | INTRAVENOUS | Status: DC | PRN

## 2024-01-09 MED ORDER — SODIUM CHLORIDE 0.9 % IV SOLN: INTRAVENOUS | Status: DC

## 2024-01-09 MED ORDER — SODIUM CHLORIDE 0.9 % IV SOLN
INTRAVENOUS | Status: DC | PRN
  Administered 2024-01-09: 80 mL

## 2024-01-09 MED ORDER — LOSARTAN POTASSIUM 50 MG PO TABS
100.0000 mg | ORAL_TABLET | Freq: Every day | ORAL | Status: DC
  Administered 2024-01-10: 100 mg via ORAL
  Filled 2024-01-09: qty 2

## 2024-01-09 MED ORDER — AZATHIOPRINE 25 MG PO HALF TABLET
25.0000 mg | ORAL_TABLET | Freq: Every day | ORAL | Status: DC
  Administered 2024-01-10: 25 mg via ORAL
  Filled 2024-01-09: qty 1

## 2024-01-09 MED ORDER — FENTANYL CITRATE (PF) 50 MCG/ML IJ SOSY
50.0000 ug | PREFILLED_SYRINGE | INTRAMUSCULAR | Status: DC
  Administered 2024-01-09: 50 ug via INTRAVENOUS
  Filled 2024-01-09: qty 2

## 2024-01-09 MED ORDER — AMLODIPINE BESYLATE 5 MG PO TABS
5.0000 mg | ORAL_TABLET | Freq: Every day | ORAL | Status: DC
  Filled 2024-01-09: qty 1

## 2024-01-09 MED ORDER — BUPIVACAINE LIPOSOME 1.3 % IJ SUSP
INTRAMUSCULAR | Status: AC
  Filled 2024-01-09: qty 20

## 2024-01-09 MED ORDER — PROPOFOL 10 MG/ML IV BOLUS
INTRAVENOUS | Status: AC
  Filled 2024-01-09: qty 20

## 2024-01-09 MED ORDER — SODIUM CHLORIDE (PF) 0.9 % IJ SOLN
INTRAMUSCULAR | Status: AC
  Filled 2024-01-09: qty 50

## 2024-01-09 MED ORDER — METOCLOPRAMIDE HCL 5 MG/ML IJ SOLN
5.0000 mg | Freq: Three times a day (TID) | INTRAMUSCULAR | Status: DC | PRN
  Administered 2024-01-09: 10 mg via INTRAVENOUS
  Filled 2024-01-09: qty 2

## 2024-01-09 MED ORDER — SODIUM CHLORIDE (PF) 0.9 % IJ SOLN
INTRAMUSCULAR | Status: AC
  Filled 2024-01-09: qty 10

## 2024-01-09 MED ORDER — AMLODIPINE BESYLATE 5 MG PO TABS: 5.0000 mg | ORAL_TABLET | Freq: Every day | ORAL | Status: DC

## 2024-01-09 MED ORDER — ONDANSETRON HCL 4 MG PO TABS
4.0000 mg | ORAL_TABLET | Freq: Four times a day (QID) | ORAL | Status: DC | PRN
  Administered 2024-01-10: 4 mg via ORAL
  Filled 2024-01-09: qty 1

## 2024-01-09 MED ORDER — PROPOFOL 1000 MG/100ML IV EMUL
INTRAVENOUS | Status: AC
  Filled 2024-01-09: qty 100

## 2024-01-09 MED ORDER — PANTOPRAZOLE SODIUM 40 MG PO TBEC
40.0000 mg | DELAYED_RELEASE_TABLET | Freq: Every day | ORAL | Status: DC
  Administered 2024-01-10: 40 mg via ORAL
  Filled 2024-01-09 (×2): qty 1

## 2024-01-09 MED ORDER — CHLORHEXIDINE GLUCONATE 0.12 % MT SOLN
15.0000 mL | Freq: Once | OROMUCOSAL | Status: AC
  Administered 2024-01-09: 15 mL via OROMUCOSAL

## 2024-01-09 MED ORDER — DOCUSATE SODIUM 100 MG PO CAPS
100.0000 mg | ORAL_CAPSULE | Freq: Two times a day (BID) | ORAL | Status: DC
  Administered 2024-01-09 – 2024-01-10 (×2): 100 mg via ORAL
  Filled 2024-01-09 (×3): qty 1

## 2024-01-09 MED ORDER — GLYCOPYRROLATE 0.2 MG/ML IJ SOLN
INTRAMUSCULAR | Status: DC | PRN
  Administered 2024-01-09: .2 mg via INTRAVENOUS

## 2024-01-09 MED ORDER — PYRIDOSTIGMINE BROMIDE 60 MG PO TABS
30.0000 mg | ORAL_TABLET | Freq: Three times a day (TID) | ORAL | Status: DC
  Administered 2024-01-09 – 2024-01-10 (×2): 30 mg via ORAL
  Filled 2024-01-09 (×2): qty 0.5

## 2024-01-09 MED ORDER — POLYETHYLENE GLYCOL 3350 17 G PO PACK: 17.0000 g | PACK | Freq: Every day | ORAL | Status: DC | PRN

## 2024-01-09 MED ORDER — TRANEXAMIC ACID-NACL 1000-0.7 MG/100ML-% IV SOLN
1000.0000 mg | INTRAVENOUS | Status: AC
  Administered 2024-01-09: 1000 mg via INTRAVENOUS
  Filled 2024-01-09: qty 100

## 2024-01-09 MED ORDER — SODIUM CHLORIDE 0.9 % IR SOLN
Status: DC | PRN
  Administered 2024-01-09: 1000 mL

## 2024-01-09 MED ORDER — ACETAMINOPHEN 500 MG PO TABS
1000.0000 mg | ORAL_TABLET | Freq: Four times a day (QID) | ORAL | Status: DC
  Administered 2024-01-09 – 2024-01-10 (×3): 1000 mg via ORAL
  Filled 2024-01-09 (×3): qty 2

## 2024-01-09 MED ORDER — ORAL CARE MOUTH RINSE: 15.0000 mL | Freq: Once | OROMUCOSAL | Status: AC

## 2024-01-09 MED ORDER — FUROSEMIDE 20 MG PO TABS: 20.0000 mg | ORAL_TABLET | Freq: Every day | ORAL | Status: DC | PRN

## 2024-01-09 MED ORDER — BISACODYL 10 MG RE SUPP: 10.0000 mg | Freq: Every day | RECTAL | Status: DC | PRN

## 2024-01-09 MED ORDER — MIDAZOLAM HCL (PF) 2 MG/2ML IJ SOLN: 1.0000 mg | INTRAMUSCULAR | Status: DC

## 2024-01-09 MED ORDER — OXYCODONE HCL 5 MG PO TABS
5.0000 mg | ORAL_TABLET | ORAL | Status: DC | PRN
  Administered 2024-01-09 – 2024-01-10 (×2): 10 mg via ORAL
  Filled 2024-01-09 (×4): qty 2

## 2024-01-09 MED ORDER — BUPIVACAINE LIPOSOME 1.3 % IJ SUSP: 20.0000 mL | Freq: Once | INTRAMUSCULAR | Status: DC

## 2024-01-09 MED ORDER — METHOCARBAMOL 500 MG PO TABS
500.0000 mg | ORAL_TABLET | Freq: Four times a day (QID) | ORAL | Status: DC | PRN
  Administered 2024-01-09 – 2024-01-10 (×2): 500 mg via ORAL
  Filled 2024-01-09 (×2): qty 1

## 2024-01-09 MED ORDER — MUPIROCIN 2 % EX OINT: 1.0000 | TOPICAL_OINTMENT | Freq: Two times a day (BID) | CUTANEOUS | 0 refills | Status: AC

## 2024-01-09 MED ORDER — CEFAZOLIN SODIUM-DEXTROSE 2-4 GM/100ML-% IV SOLN
2.0000 g | INTRAVENOUS | Status: AC
  Administered 2024-01-09: 2 g via INTRAVENOUS
  Filled 2024-01-09: qty 100

## 2024-01-09 MED ORDER — AZATHIOPRINE 50 MG PO TABS: 50.0000 mg | ORAL_TABLET | Freq: Every day | ORAL | Status: DC

## 2024-01-09 MED ORDER — PROPOFOL 500 MG/50ML IV EMUL
INTRAVENOUS | Status: DC | PRN
  Administered 2024-01-09: 30 mg via INTRAVENOUS
  Administered 2024-01-09: 100 ug/kg/min via INTRAVENOUS

## 2024-01-09 MED ORDER — INSULIN ASPART 100 UNIT/ML IJ SOLN: 0.0000 [IU] | INTRAMUSCULAR | Status: DC | PRN

## 2024-01-09 MED ORDER — PHENOL 1.4 % MT LIQD: 1.0000 | OROMUCOSAL | Status: DC | PRN

## 2024-01-09 MED ORDER — POVIDONE-IODINE 10 % EX SWAB: 2.0000 | Freq: Once | CUTANEOUS | Status: DC

## 2024-01-09 MED ORDER — GLYCOPYRROLATE 0.2 MG/ML IJ SOLN
INTRAMUSCULAR | Status: AC
  Filled 2024-01-09: qty 1

## 2024-01-09 MED ORDER — FENTANYL CITRATE (PF) 50 MCG/ML IJ SOSY
PREFILLED_SYRINGE | INTRAMUSCULAR | Status: AC
  Filled 2024-01-09: qty 2

## 2024-01-09 MED ORDER — ACETAMINOPHEN 10 MG/ML IV SOLN: 1000.0000 mg | Freq: Four times a day (QID) | INTRAVENOUS | Status: DC

## 2024-01-09 MED ORDER — ONDANSETRON HCL 4 MG/2ML IJ SOLN
4.0000 mg | Freq: Four times a day (QID) | INTRAMUSCULAR | Status: DC | PRN
  Administered 2024-01-09: 4 mg via INTRAVENOUS
  Filled 2024-01-09: qty 2

## 2024-01-09 MED ORDER — PHENYLEPHRINE 80 MCG/ML (10ML) SYRINGE FOR IV PUSH (FOR BLOOD PRESSURE SUPPORT)
PREFILLED_SYRINGE | INTRAVENOUS | Status: AC
  Filled 2024-01-09: qty 10

## 2024-01-09 MED ORDER — 0.9 % SODIUM CHLORIDE (POUR BTL) OPTIME
TOPICAL | Status: DC | PRN
  Administered 2024-01-09: 1000 mL

## 2024-01-09 MED ORDER — FENTANYL CITRATE (PF) 50 MCG/ML IJ SOSY
25.0000 ug | PREFILLED_SYRINGE | INTRAMUSCULAR | Status: DC | PRN
  Administered 2024-01-09: 50 ug via INTRAVENOUS

## 2024-01-09 MED ORDER — HYDROMORPHONE HCL 1 MG/ML IJ SOLN
0.5000 mg | INTRAMUSCULAR | Status: DC | PRN
  Administered 2024-01-09: 1 mg via INTRAVENOUS
  Filled 2024-01-09: qty 1

## 2024-01-09 MED ORDER — INSULIN ASPART 100 UNIT/ML IJ SOLN
0.0000 [IU] | Freq: Three times a day (TID) | INTRAMUSCULAR | Status: DC
  Administered 2024-01-09: 9 [IU] via SUBCUTANEOUS
  Administered 2024-01-10: 2 [IU] via SUBCUTANEOUS
  Filled 2024-01-09: qty 9

## 2024-01-09 MED ORDER — MENTHOL 3 MG MT LOZG: 1.0000 | LOZENGE | OROMUCOSAL | Status: DC | PRN

## 2024-01-09 MED ORDER — ROSUVASTATIN CALCIUM 5 MG PO TABS: 5.0000 mg | ORAL_TABLET | ORAL | Status: DC

## 2024-01-09 MED ORDER — TRAMADOL HCL 50 MG PO TABS
50.0000 mg | ORAL_TABLET | Freq: Four times a day (QID) | ORAL | Status: DC | PRN
  Administered 2024-01-09: 100 mg via ORAL
  Administered 2024-01-10: 50 mg via ORAL
  Filled 2024-01-09: qty 1
  Filled 2024-01-09 (×2): qty 2

## 2024-01-09 MED ORDER — INSULIN GLARGINE 100 UNIT/ML ~~LOC~~ SOLN
25.0000 [IU] | Freq: Every day | SUBCUTANEOUS | Status: DC
  Administered 2024-01-09: 25 [IU] via SUBCUTANEOUS
  Filled 2024-01-09 (×2): qty 0.25

## 2024-01-09 MED ORDER — LEVOTHYROXINE SODIUM 25 MCG PO TABS
175.0000 ug | ORAL_TABLET | Freq: Every day | ORAL | Status: DC
  Administered 2024-01-10: 175 ug via ORAL
  Filled 2024-01-09: qty 2
  Filled 2024-01-09 (×2): qty 1
  Filled 2024-01-09: qty 2

## 2024-01-09 MED ORDER — MEPIVACAINE HCL (PF) 2 % IJ SOLN
INTRAMUSCULAR | Status: DC | PRN
  Administered 2024-01-09: 3 mL via INTRATHECAL

## 2024-01-09 MED ORDER — DIPHENHYDRAMINE HCL 12.5 MG/5ML PO ELIX: 12.5000 mg | ORAL_SOLUTION | ORAL | Status: DC | PRN

## 2024-01-09 MED ORDER — DEXAMETHASONE SOD PHOSPHATE PF 10 MG/ML IJ SOLN
INTRAMUSCULAR | Status: DC | PRN
  Administered 2024-01-09: 10 mg via PERINEURAL
  Administered 2024-01-09: 5 mg via PERINEURAL

## 2024-01-09 MED ORDER — LACTATED RINGERS IV SOLN: INTRAVENOUS | Status: DC

## 2024-01-09 MED ORDER — AZATHIOPRINE 25 MG PO HALF TABLET: 25.0000 mg | ORAL_TABLET | Freq: Every day | ORAL | Status: DC

## 2024-01-09 MED ORDER — FLEET ENEMA RE ENEM: 1.0000 | ENEMA | Freq: Once | RECTAL | Status: DC | PRN

## 2024-01-09 MED ORDER — CEFAZOLIN SODIUM-DEXTROSE 2-4 GM/100ML-% IV SOLN
2.0000 g | Freq: Four times a day (QID) | INTRAVENOUS | Status: AC
  Administered 2024-01-09 (×2): 2 g via INTRAVENOUS
  Filled 2024-01-09 (×2): qty 100

## 2024-01-09 MED ORDER — OXYCODONE HCL 5 MG PO TABS: 5.0000 mg | ORAL_TABLET | Freq: Once | ORAL | Status: DC | PRN

## 2024-01-09 MED ORDER — EPHEDRINE SULFATE (PRESSORS) 25 MG/5ML IV SOSY
PREFILLED_SYRINGE | INTRAVENOUS | Status: DC | PRN
  Administered 2024-01-09: 10 mg via INTRAVENOUS

## 2024-01-09 MED ORDER — METHOCARBAMOL 1000 MG/10ML IJ SOLN
500.0000 mg | Freq: Four times a day (QID) | INTRAMUSCULAR | Status: DC | PRN
  Administered 2024-01-09: 500 mg via INTRAVENOUS
  Filled 2024-01-09: qty 10

## 2024-01-09 MED ORDER — AZATHIOPRINE 50 MG PO TABS
50.0000 mg | ORAL_TABLET | Freq: Every day | ORAL | Status: DC
  Administered 2024-01-10: 50 mg via ORAL
  Filled 2024-01-09: qty 1

## 2024-01-09 MED ORDER — GLIMEPIRIDE 4 MG PO TABS
4.0000 mg | ORAL_TABLET | Freq: Two times a day (BID) | ORAL | Status: DC
  Administered 2024-01-09 – 2024-01-10 (×2): 4 mg via ORAL
  Filled 2024-01-09 (×2): qty 1

## 2024-01-09 MED ORDER — METOCLOPRAMIDE HCL 5 MG PO TABS: 5.0000 mg | ORAL_TABLET | Freq: Three times a day (TID) | ORAL | Status: DC | PRN

## 2024-01-09 MED ORDER — INSULIN LISPRO (1 UNIT DIAL) 100 UNIT/ML (KWIKPEN): 5.0000 [IU] | PEN_INJECTOR | Freq: Two times a day (BID) | SUBCUTANEOUS | Status: DC | PRN

## 2024-01-09 MED ORDER — ONDANSETRON HCL 4 MG/2ML IJ SOLN
INTRAMUSCULAR | Status: DC | PRN
  Administered 2024-01-09: 4 mg via INTRAVENOUS

## 2024-01-09 MED ORDER — ONDANSETRON HCL 4 MG/2ML IJ SOLN: 4.0000 mg | Freq: Once | INTRAMUSCULAR | Status: DC | PRN

## 2024-01-09 MED ORDER — INSULIN ASPART 100 UNIT/ML IJ SOLN: 0.0000 [IU] | Freq: Three times a day (TID) | INTRAMUSCULAR | Status: DC

## 2024-01-09 MED ORDER — NITROGLYCERIN 0.4 MG SL SUBL: 0.4000 mg | SUBLINGUAL_TABLET | SUBLINGUAL | Status: DC | PRN

## 2024-01-09 MED ORDER — ACETAMINOPHEN 10 MG/ML IV SOLN
1000.0000 mg | Freq: Once | INTRAVENOUS | Status: AC
  Administered 2024-01-09: 1000 mg via INTRAVENOUS
  Filled 2024-01-09: qty 100

## 2024-01-09 MED ORDER — ONDANSETRON HCL 4 MG/2ML IJ SOLN
INTRAMUSCULAR | Status: AC
  Filled 2024-01-09: qty 2

## 2024-01-09 MED ORDER — ROPIVACAINE HCL 5 MG/ML IJ SOLN
INTRAMUSCULAR | Status: DC | PRN
  Administered 2024-01-09: 15 mL via PERINEURAL

## 2024-01-09 MED ORDER — CHLORHEXIDINE GLUCONATE 4 % EX SOLN: 1.0000 | CUTANEOUS | 1 refills | Status: AC

## 2024-01-09 MED ORDER — RIVAROXABAN 10 MG PO TABS
10.0000 mg | ORAL_TABLET | Freq: Every day | ORAL | Status: DC
  Administered 2024-01-10: 10 mg via ORAL
  Filled 2024-01-09: qty 1

## 2024-01-09 MED ORDER — AMLODIPINE BESYLATE 2.5 MG PO TABS: 2.5000 mg | ORAL_TABLET | Freq: Every day | ORAL | Status: DC

## 2024-01-09 MED ORDER — INSULIN GLARGINE (2 UNIT DIAL) 300 UNIT/ML ~~LOC~~ SOPN: 24.0000 [IU] | PEN_INJECTOR | Freq: Every day | SUBCUTANEOUS | Status: DC

## 2024-01-09 MED ORDER — OXYCODONE HCL 5 MG/5ML PO SOLN: 5.0000 mg | Freq: Once | ORAL | Status: DC | PRN

## 2024-01-09 MED ORDER — AMLODIPINE BESYLATE 5 MG PO TABS
2.5000 mg | ORAL_TABLET | Freq: Every day | ORAL | Status: DC
  Filled 2024-01-09: qty 1

## 2024-01-09 SURGICAL SUPPLY — 43 items
ATTUNE MED DOME PAT 41 KNEE (Knees) IMPLANT
ATTUNE PS FEM LT SZ 7 CEM KNEE (Femur) IMPLANT
ATTUNE PSRP INSR SZ7 7 KNEE (Insert) IMPLANT
BAG COUNTER SPONGE SURGICOUNT (BAG) IMPLANT
BAG ZIPLOCK 12X15 (MISCELLANEOUS) ×1 IMPLANT
BASE TIBIAL ROT PLAT SZ 7 KNEE (Knees) IMPLANT
BLADE SAG 18X100X1.27 (BLADE) ×1 IMPLANT
BLADE SAW SGTL 11.0X1.19X90.0M (BLADE) ×1 IMPLANT
BNDG ELASTIC 6INX 5YD STR LF (GAUZE/BANDAGES/DRESSINGS) ×1 IMPLANT
BNDG ELASTIC 6X10 VLCR STRL LF (GAUZE/BANDAGES/DRESSINGS) IMPLANT
BOWL SMART MIX CTS (DISPOSABLE) ×1 IMPLANT
CATH URTH STD 16FR FL 2W DRN (CATHETERS) IMPLANT
CEMENT HV SMART SET (Cement) ×2 IMPLANT
COVER SURGICAL LIGHT HANDLE (MISCELLANEOUS) ×1 IMPLANT
CUFF TRNQT CYL 34X4.125X (TOURNIQUET CUFF) ×1 IMPLANT
DERMABOND ADVANCED .7 DNX12 (GAUZE/BANDAGES/DRESSINGS) ×1 IMPLANT
DRAPE U-SHAPE 47X51 STRL (DRAPES) ×1 IMPLANT
DRSG AQUACEL AG ADV 3.5X10 (GAUZE/BANDAGES/DRESSINGS) ×1 IMPLANT
DURAPREP 26ML APPLICATOR (WOUND CARE) ×1 IMPLANT
ELECT REM PT RETURN 15FT ADLT (MISCELLANEOUS) ×1 IMPLANT
GLOVE BIO SURGEON STRL SZ 6.5 (GLOVE) IMPLANT
GLOVE BIO SURGEON STRL SZ8 (GLOVE) ×1 IMPLANT
GLOVE BIOGEL PI IND STRL 7.0 (GLOVE) ×1 IMPLANT
GLOVE BIOGEL PI IND STRL 8 (GLOVE) ×1 IMPLANT
GOWN STRL REUS W/ TWL LRG LVL3 (GOWN DISPOSABLE) ×1 IMPLANT
HOLDER FOLEY CATH W/STRAP (MISCELLANEOUS) ×1 IMPLANT
IMMOBILIZER KNEE 20 THIGH 36 (SOFTGOODS) ×1 IMPLANT
KIT TURNOVER KIT A (KITS) ×1 IMPLANT
MANIFOLD NEPTUNE II (INSTRUMENTS) ×1 IMPLANT
NS IRRIG 1000ML POUR BTL (IV SOLUTION) ×1 IMPLANT
PACK TOTAL KNEE CUSTOM (KITS) ×1 IMPLANT
PADDING CAST COTTON 6X4 STRL (CAST SUPPLIES) ×2 IMPLANT
PENCIL SMOKE EVACUATOR (MISCELLANEOUS) ×1 IMPLANT
PIN STEINMAN FIXATION KNEE (PIN) IMPLANT
PROTECTOR NERVE ULNAR (MISCELLANEOUS) ×1 IMPLANT
SET HNDPC FAN SPRY TIP SCT (DISPOSABLE) ×1 IMPLANT
SUT MNCRL AB 4-0 PS2 18 (SUTURE) ×1 IMPLANT
SUT VIC AB 2-0 CT1 TAPERPNT 27 (SUTURE) ×3 IMPLANT
SUTURE STRATFX 0 PDS 27 VIOLET (SUTURE) ×1 IMPLANT
TOWEL GREEN STERILE FF (TOWEL DISPOSABLE) ×1 IMPLANT
TRAY FOLEY MTR SLVR 16FR STAT (SET/KITS/TRAYS/PACK) ×1 IMPLANT
TUBE SUCTION HIGH CAP CLEAR NV (SUCTIONS) ×1 IMPLANT
WRAP KNEE MAXI GEL POST OP (GAUZE/BANDAGES/DRESSINGS) ×1 IMPLANT

## 2024-01-09 NOTE — Care Plan (Signed)
 Ortho Bundle Case Management Note  Patient Details  Name: Mario Burns MRN: [id number] Date of Birth: Feb 03, 1945  L TKA on 01/09/24  DCP: Home with wife  DME: No needs  PT: OLIVA Flint                   DME Arranged:  N/A DME Agency:  NA  HH Arranged:    HH Agency:     Additional Comments: Please contact me with any questions of if this plan should need to change.  Lyle Pepper, CCM [facility] [phone]  Ext. [phone]   01/09/2024, 7:51 AM

## 2024-01-09 NOTE — Plan of Care (Signed)
" °  Problem: Education: Goal: Knowledge of the prescribed therapeutic regimen will improve Outcome: Progressing   Problem: Bowel/Gastric: Goal: Gastrointestinal status for postoperative course will improve Outcome: Progressing   Problem: Cardiac: Goal: Ability to maintain an adequate cardiac output Outcome: Progressing Goal: Will show no evidence of cardiac arrhythmias Outcome: Progressing   "

## 2024-01-09 NOTE — Anesthesia Procedure Notes (Signed)
 Spinal  Patient location during procedure: OR Start time: 01/09/2024 9:05 AM End time: 01/09/2024 9:10 AM Reason for block: surgical anesthesia  Staffing Performed: anesthesiologist  Authorized by: Keneth Lynwood POUR, MD   Performed by: Keneth Lynwood POUR, MD  Preanesthetic Checklist Completed: patient identified, IV checked, site marked, risks and benefits discussed, surgical consent, monitors and equipment checked, pre-op  evaluation and timeout performed Spinal Block Patient position: sitting Prep: DuraPrep Patient monitoring: heart rate, cardiac monitor, continuous pulse ox and blood pressure Approach: midline Location: L3-4 Injection technique: single-shot Needle Needle type: Sprotte  Needle gauge: 24 G Needle length: 9 cm Assessment Sensory level: T4 Events: CSF return

## 2024-01-09 NOTE — Progress Notes (Signed)
 Orthopedic Tech Progress Note Patient Details:  Mario Burns 1945-04-13 [id number]  Patient ID: [name], male   DOB: 10-18-1945, 79 y.o.   MRN: [id number] Removed CPM from pt.  [name] 01/09/2024, 3:05 PM

## 2024-01-09 NOTE — Plan of Care (Signed)
 " Problem: Education: Goal: Knowledge of the prescribed therapeutic regimen will improve 01/09/2024 2031 by Lenon Charmaine LABOR, RN Outcome: Progressing 01/09/2024 2031 by Lenon Charmaine LABOR, RN Outcome: Progressing   Problem: Bowel/Gastric: Goal: Gastrointestinal status for postoperative course will improve 01/09/2024 2031 by Lenon Charmaine LABOR, RN Outcome: Progressing 01/09/2024 2031 by Lenon Charmaine LABOR, RN Outcome: Progressing   Problem: Cardiac: Goal: Ability to maintain an adequate cardiac output 01/09/2024 2031 by Lenon Charmaine LABOR, RN Outcome: Progressing 01/09/2024 2031 by Lenon Charmaine LABOR, RN Outcome: Progressing Goal: Will show no evidence of cardiac arrhythmias 01/09/2024 2031 by Lenon Charmaine LABOR, RN Outcome: Progressing 01/09/2024 2031 by Lenon Charmaine LABOR, RN Outcome: Progressing   Problem: Nutritional: Goal: Will attain and maintain optimal nutritional status 01/09/2024 2031 by Lenon Charmaine LABOR, RN Outcome: Progressing 01/09/2024 2031 by Lenon Charmaine LABOR, RN Outcome: Progressing   Problem: Neurological: Goal: Will regain or maintain usual level of consciousness 01/09/2024 2031 by Lenon Charmaine LABOR, RN Outcome: Progressing 01/09/2024 2031 by Lenon Charmaine LABOR, RN Outcome: Progressing   Problem: Clinical Measurements: Goal: Ability to maintain clinical measurements within normal limits 01/09/2024 2031 by Lenon Charmaine LABOR, RN Outcome: Progressing 01/09/2024 2031 by Lenon Charmaine LABOR, RN Outcome: Progressing Goal: Postoperative complications will be avoided or minimized 01/09/2024 2031 by Lenon Charmaine LABOR, RN Outcome: Progressing 01/09/2024 2031 by Lenon Charmaine LABOR, RN Outcome: Progressing   Problem: Respiratory: Goal: Will regain and/or maintain adequate ventilation 01/09/2024 2031 by Lenon Charmaine LABOR, RN Outcome: Progressing 01/09/2024 2031 by Lenon Charmaine LABOR, RN Outcome: Progressing Goal: Respiratory status will  improve 01/09/2024 2031 by Lenon Charmaine LABOR, RN Outcome: Progressing 01/09/2024 2031 by Lenon Charmaine LABOR, RN Outcome: Progressing   Problem: Skin Integrity: Goal: Demonstrates signs of wound healing without infection 01/09/2024 2031 by Lenon Charmaine LABOR, RN Outcome: Progressing 01/09/2024 2031 by Lenon Charmaine LABOR, RN Outcome: Progressing   Problem: Urinary Elimination: Goal: Will remain free from infection 01/09/2024 2031 by Lenon Charmaine LABOR, RN Outcome: Progressing 01/09/2024 2031 by Lenon Charmaine LABOR, RN Outcome: Progressing Goal: Ability to achieve and maintain adequate urine output 01/09/2024 2031 by Lenon Charmaine LABOR, RN Outcome: Progressing 01/09/2024 2031 by Lenon Charmaine LABOR, RN Outcome: Progressing   Problem: Education: Goal: Knowledge of General Education information will improve Description: Including pain rating scale, medication(s)/side effects and non-pharmacologic comfort measures 01/09/2024 2031 by Lenon Charmaine LABOR, RN Outcome: Progressing 01/09/2024 2031 by Lenon Charmaine LABOR, RN Outcome: Progressing   Problem: Health Behavior/Discharge Planning: Goal: Ability to manage health-related needs will improve 01/09/2024 2031 by Lenon Charmaine LABOR, RN Outcome: Progressing 01/09/2024 2031 by Lenon Charmaine LABOR, RN Outcome: Progressing   Problem: Clinical Measurements: Goal: Ability to maintain clinical measurements within normal limits will improve 01/09/2024 2031 by Lenon Charmaine LABOR, RN Outcome: Progressing 01/09/2024 2031 by Lenon Charmaine LABOR, RN Outcome: Progressing Goal: Will remain free from infection 01/09/2024 2031 by Lenon Charmaine LABOR, RN Outcome: Progressing 01/09/2024 2031 by Lenon Charmaine LABOR, RN Outcome: Progressing Goal: Diagnostic test results will improve 01/09/2024 2031 by Lenon Charmaine LABOR, RN Outcome: Progressing 01/09/2024 2031 by Lenon Charmaine LABOR, RN Outcome: Progressing Goal: Respiratory complications will  improve 01/09/2024 2031 by Lenon Charmaine LABOR, RN Outcome: Progressing 01/09/2024 2031 by Lenon Charmaine LABOR, RN Outcome: Progressing Goal: Cardiovascular complication will be avoided 01/09/2024 2031 by Lenon Charmaine LABOR, RN Outcome: Progressing 01/09/2024 2031 by Lenon Charmaine LABOR, RN Outcome: Progressing   Problem: Activity: Goal: Risk for activity intolerance will decrease 01/09/2024 2031 by Lenon Charmaine LABOR, RN Outcome: Progressing 01/09/2024 2031  by Lenon Charmaine LABOR, RN Outcome: Progressing   Problem: Nutrition: Goal: Adequate nutrition will be maintained 01/09/2024 2031 by Lenon Charmaine LABOR, RN Outcome: Progressing 01/09/2024 2031 by Lenon Charmaine LABOR, RN Outcome: Progressing   Problem: Coping: Goal: Level of anxiety will decrease 01/09/2024 2031 by Lenon Charmaine LABOR, RN Outcome: Progressing 01/09/2024 2031 by Lenon Charmaine LABOR, RN Outcome: Progressing   Problem: Elimination: Goal: Will not experience complications related to bowel motility 01/09/2024 2031 by Lenon Charmaine LABOR, RN Outcome: Progressing 01/09/2024 2031 by Lenon Charmaine LABOR, RN Outcome: Progressing Goal: Will not experience complications related to urinary retention 01/09/2024 2031 by Lenon Charmaine LABOR, RN Outcome: Progressing 01/09/2024 2031 by Lenon Charmaine LABOR, RN Outcome: Progressing   Problem: Pain Managment: Goal: General experience of comfort will improve and/or be controlled 01/09/2024 2031 by Lenon Charmaine LABOR, RN Outcome: Progressing 01/09/2024 2031 by Lenon Charmaine LABOR, RN Outcome: Progressing   Problem: Safety: Goal: Ability to remain free from injury will improve 01/09/2024 2031 by Lenon Charmaine LABOR, RN Outcome: Progressing 01/09/2024 2031 by Lenon Charmaine LABOR, RN Outcome: Progressing   Problem: Skin Integrity: Goal: Risk for impaired skin integrity will decrease 01/09/2024 2031 by Lenon Charmaine LABOR, RN Outcome: Progressing 01/09/2024 2031 by Lenon Charmaine LABOR, RN Outcome: Progressing   Problem: Education: Goal: Ability to describe self-care measures that may prevent or decrease complications (Diabetes Survival Skills Education) will improve 01/09/2024 2031 by Lenon Charmaine LABOR, RN Outcome: Progressing 01/09/2024 2031 by Lenon Charmaine LABOR, RN Outcome: Progressing Goal: Individualized Educational Video(s) 01/09/2024 2031 by Lenon Charmaine LABOR, RN Outcome: Progressing 01/09/2024 2031 by Lenon Charmaine LABOR, RN Outcome: Progressing   Problem: Coping: Goal: Ability to adjust to condition or change in health will improve Outcome: Progressing   Problem: Fluid Volume: Goal: Ability to maintain a balanced intake and output will improve Outcome: Progressing   Problem: Health Behavior/Discharge Planning: Goal: Ability to identify and utilize available resources and services will improve Outcome: Progressing Goal: Ability to manage health-related needs will improve Outcome: Progressing   Problem: Metabolic: Goal: Ability to maintain appropriate glucose levels will improve Outcome: Progressing   Problem: Nutritional: Goal: Maintenance of adequate nutrition will improve Outcome: Progressing Goal: Progress toward achieving an optimal weight will improve Outcome: Progressing   Problem: Skin Integrity: Goal: Risk for impaired skin integrity will decrease Outcome: Progressing   Problem: Tissue Perfusion: Goal: Adequacy of tissue perfusion will improve Outcome: Progressing   Problem: Education: Goal: Knowledge of the prescribed therapeutic regimen will improve Outcome: Progressing Goal: Individualized Educational Video(s) Outcome: Progressing   Problem: Activity: Goal: Ability to avoid complications of mobility impairment will improve Outcome: Progressing Goal: Range of joint motion will improve Outcome: Progressing   Problem: Clinical Measurements: Goal: Postoperative complications will be avoided or  minimized Outcome: Progressing   Problem: Pain Management: Goal: Pain level will decrease with appropriate interventions Outcome: Progressing   "

## 2024-01-09 NOTE — Anesthesia Procedure Notes (Signed)
 Procedure Name: MAC Date/Time: 01/09/2024 9:02 AM  Performed by: Nada Corean CROME, CRNAPre-anesthesia Checklist: Patient identified, Emergency Drugs available, Suction available, Patient being monitored and Timeout performed Patient Re-evaluated:Patient Re-evaluated prior to induction Oxygen Delivery Method: Simple face mask Preoxygenation: Pre-oxygenation with 100% oxygen Induction Type: IV induction Ventilation: Nasal airway inserted- appropriate to patient size Placement Confirmation: positive ETCO2 Dental Injury: Teeth and Oropharynx as per pre-operative assessment

## 2024-01-09 NOTE — Interval H&P Note (Signed)
 History and Physical Interval Note:  01/09/2024 7:13 AM  [name]  has presented today for surgery, with the diagnosis of Left knee osteoarthritis.  The various methods of treatment have been discussed with the patient and family. After consideration of risks, benefits and other options for treatment, the patient has consented to  Procedures: ARTHROPLASTY, KNEE, TOTAL (Left) as a surgical intervention.  The patient's history has been reviewed, patient examined, no change in status, stable for surgery.  I have reviewed the patient's chart and labs.  Questions were answered to the patient's satisfaction.     Dempsey Tyvon Eggenberger

## 2024-01-09 NOTE — Evaluation (Signed)
 Physical Therapy Evaluation Patient Details Name: Mario Burns MRN: [id number] DOB: 07-Sep-1945 Today's Date: 01/09/2024  History of Present Illness  Pt is 79 yo male s/p L TKA on 01/09/24.  Pt with hx including but not limited to arthritis, claustrophobia, DDD cervical spine, DM, GERD, DM2, prostate CA, myasthenia gravis, HLD, back pain  Clinical Impression  Pt is s/p TKA resulting in the deficits listed below (see PT Problem List). At baseline, pt is independent.  He has home support, DME, and single level home with 2 steps to enter.  Today, pt with good quad activation and pain control.  He was able to ambulate 45' with RW and CGA, min A transfers.  Pt expected to progress well with therapy.  Pt will benefit from acute skilled PT to increase their independence and safety with mobility to allow discharge. Pt did report hx of difficulty voiding after foley catheter removed - notified RN potential concern for tomorrow.          If plan is discharge home, recommend the following: A little help with walking and/or transfers;A little help with bathing/dressing/bathroom;Assistance with cooking/housework;Help with stairs or ramp for entrance   Can travel by private vehicle        Equipment Recommendations None recommended by PT  Recommendations for Other Services       Functional Status Assessment Patient has had a recent decline in their functional status and demonstrates the ability to make significant improvements in function in a reasonable and predictable amount of time.     Precautions / Restrictions Precautions Precautions: Fall;Knee Required Braces or Orthoses: Knee Immobilizer - Left Knee Immobilizer - Left: Discontinue once straight leg raise with < 10 degree lag Restrictions LLE Weight Bearing Per Provider Order: Weight bearing as tolerated      Mobility  Bed Mobility Overal bed mobility: Needs Assistance Bed Mobility: Supine to Sit     Supine to sit: Min assist           Transfers Overall transfer level: Needs assistance Equipment used: Rolling walker (2 wheels) Transfers: Sit to/from Stand Sit to Stand: Contact guard assist           General transfer comment: cues for hand placement; STS x 2; performed toileting ADLs in sitting independently    Ambulation/Gait Ambulation/Gait assistance: Contact guard assist Gait Distance (Feet): 80 Feet Assistive device: Rolling walker (2 wheels) Gait Pattern/deviations: Step-through pattern, Decreased stride length, Decreased weight shift to left Gait velocity: decreased but functional     General Gait Details: Reports R knee will also buckling due to arthritis.  Recommended smaller steps and staying close to [employer]     Tilt Bed    Modified Rankin (Stroke Patients Only)       Balance Overall balance assessment: Needs assistance Sitting-balance support: No upper extremity supported Sitting balance-Leahy Scale: Good     Standing balance support: Bilateral upper extremity supported Standing balance-Leahy Scale: Poor Standing balance comment: Steady wtih RW did not test without                             Pertinent Vitals/Pain Pain Assessment Pain Assessment: 0-10 Pain Score: 4  Pain Location: L knee and hip Pain Descriptors / Indicators: Burning, Aching Pain Intervention(s): Limited activity within patient's tolerance, Monitored during session, Premedicated before session, Repositioned (hip improved with walking)  Home Living Family/patient expects to be discharged to:: Private residence Living Arrangements: Spouse/significant other Available Help at Discharge: Family;Available 24 hours/day Type of Home: House Home Access: Stairs to enter Entrance Stairs-Rails: None (Can hold side of house and porch post) Entrance Stairs-Number of Steps: 2   Home Layout: One level Home Equipment: Grab bars - tub/shower;Shower Careers Information Officer (2 wheels);Cane - single point      Prior Function Prior Level of Function : Driving;Independent/Modified Independent             Mobility Comments: Could ambulate in community without AD (does better in store pushing cart) ADLs Comments: independent with iadls and adls     Extremity/Trunk Assessment   Upper Extremity Assessment Upper Extremity Assessment: Overall WFL for tasks assessed    Lower Extremity Assessment Lower Extremity Assessment: LLE deficits/detail;RLE deficits/detail RLE Deficits / Details: Noted significant genu varus; reports needs TKA also LLE Deficits / Details: Expected post op changes; ROM knee ~8 to 70; MMT: ankle 5/5, knee 2/5, hip 2/5    Cervical / Trunk Assessment Cervical / Trunk Assessment: Normal  Communication   Communication Factors Affecting Communication: Hearing impaired    Cognition Arousal: Alert Behavior During Therapy: WFL for tasks assessed/performed   PT - Cognitive impairments: No apparent impairments                         Following commands: Intact       Cueing       General Comments General comments (skin integrity, edema, etc.): VSS left on RA with continuous pulse ox on.  Encouraged ankle pumps tonight and educated on resting with leg straight but ok to move if needed    Exercises     Assessment/Plan    PT Assessment Patient needs continued PT services  PT Problem List Decreased strength;Pain;Decreased range of motion;Decreased activity tolerance;Decreased balance;Decreased mobility;Decreased knowledge of use of DME       PT Treatment Interventions DME instruction;Therapeutic exercise;Gait training;Stair training;Functional mobility training;Therapeutic activities;Patient/family education;Modalities;Balance training    PT Goals (Current goals can be found in the Care Plan section)  Acute Rehab PT Goals Patient Stated Goal: return home PT Goal Formulation: With patient/family Time For Goal  Achievement: 01/23/24 Potential to Achieve Goals: Good    Frequency 7X/week     Co-evaluation               AM-PAC PT 6 Clicks Mobility  Outcome Measure Help needed turning from your back to your side while in a flat bed without using bedrails?: A Little Help needed moving from lying on your back to sitting on the side of a flat bed without using bedrails?: A Little Help needed moving to and from a bed to a chair (including a wheelchair)?: A Little Help needed standing up from a chair using your arms (e.g., wheelchair or bedside chair)?: A Little Help needed to walk in hospital room?: A Little Help needed climbing 3-5 steps with a railing? : A Little 6 Click Score: 18    End of Session Equipment Utilized During Treatment: Gait belt Activity Tolerance: Patient tolerated treatment well Patient left: with chair alarm set;in chair;with call bell/phone within reach;with SCD's reapplied Nurse Communication: Mobility status PT Visit Diagnosis: Other abnormalities of gait and mobility (R26.89);Muscle weakness (generalized) (M62.81)    Time: 8369-8295 (out for 5 mins -pt having BM) PT Time Calculation (min) (ACUTE ONLY): 34 min   Charges:   PT Evaluation $PT Eval Low  Complexity: 1 Low PT Treatments $Gait Training: 8-22 mins PT General Charges $$ ACUTE PT VISIT: 1 Visit         Benjiman, PT Acute Rehab [facility] [phone]   Benjiman VEAR Mulberry 01/09/2024, 5:12 PM

## 2024-01-09 NOTE — Op Note (Signed)
 "    OPERATIVE REPORT-TOTAL KNEE ARTHROPLASTY   Pre-operative diagnosis- Osteoarthritis  Left knee(s)  Post-operative diagnosis- Osteoarthritis Left knee(s)  Procedure-  Left  Total Knee Arthroplasty  Surgeon- Dempsey GAILS. Cliffie Gingras, MD  Assistant- Roxie Mess, PA-C   Anesthesia-  Adductor canal block and spinal  EBL- 25 ml   Drains None  Tourniquet time-  Total Tourniquet Time Documented: Thigh (Left) - 33 minutes Total: Thigh (Left) - 33 minutes     Complications- None  Condition-PACU - hemodynamically stable.   Brief Clinical Note   Mario Burns is a 79 y.o. year old male with end stage OA of his left knee with progressively worsening pain and dysfunction. He has constant pain, with activity and at rest and significant functional deficits with difficulties even with ADLs. He has had extensive non-op management including analgesics, injections of cortisone and viscosupplements, and home exercise program, but remains in significant pain with significant dysfunction. Radiographs show bone on bone arthritis medial and patellofmoral. He presents now for left Total Knee Arthroplasty.     Procedure in detail---   The patient is brought into the operating room and positioned supine on the operating table. After successful administration of  Adductor canal block and spinal,   a tourniquet is placed high on the  Left thigh(s) and the lower extremity is prepped and draped in the usual sterile fashion. Time out is performed by the operating team and then the  Left lower extremity is wrapped in Esmarch, knee flexed and the tourniquet inflated to 300 mmHg.       A midline incision is made with a ten blade through the subcutaneous tissue to the level of the extensor mechanism. A fresh blade is used to make a medial parapatellar arthrotomy. Soft tissue over the proximal medial tibia is subperiosteally elevated to the joint line with a knife and into the semimembranosus bursa with a Cobb  elevator. Soft tissue over the proximal lateral tibia is elevated with attention being paid to avoiding the patellar tendon on the tibial tubercle. The patella is everted, knee flexed 90 degrees and the ACL and PCL are removed. Findings are bone on bone medial and patellofemoral with large global osteophytes        The drill is used to create a starting hole in the distal femur and the canal is thoroughly irrigated with sterile saline to remove the fatty contents. The 5 degree Left  valgus alignment guide is placed into the femoral canal and the distal femoral cutting block is pinned to remove 10 mm off the distal femur. Resection is made with an oscillating saw.      The tibia is subluxed forward and the menisci are removed. The extramedullary alignment guide is placed referencing proximally at the medial aspect of the tibial tubercle and distally along the second metatarsal axis and tibial crest. The block is pinned to remove 2mm off the more deficient medial  side. Resection is made with an oscillating saw. Size 7is the most appropriate size for the tibia and the proximal tibia is prepared with the modular drill and keel punch for that size.      The femoral sizing guide is placed and size 7 is most appropriate. Rotation is marked off the epicondylar axis and confirmed by creating a rectangular flexion gap at 90 degrees. The size 7 cutting block is pinned in this rotation and the anterior, posterior and chamfer cuts are made with the oscillating saw. The intercondylar block is then  placed and that cut is made.      Trial size 7 tibial component, trial size 7 posterior stabilized femur and a 7  mm posterior stabilized rotating platform insert trial is placed. Full extension is achieved with excellent varus/valgus and anterior/posterior balance throughout full range of motion. The patella is everted and thickness measured to be 27  mm. Free hand resection is taken to 15 mm, a 41 template is placed, lug holes  are drilled, trial patella is placed, and it tracks normally. Osteophytes are removed off the posterior femur with the trial in place. All trials are removed and the cut bone surfaces prepared with pulsatile lavage. Cement is mixed and once ready for implantation, the size 7 tibial implant, size  7 posterior stabilized femoral component, and the size 41 patella are cemented in place and the patella is held with the clamp. The trial insert is placed and the knee held in full extension. The Exparel  (20 ml mixed with 60 ml saline) is injected into the extensor mechanism, posterior capsule, medial and lateral gutters and subcutaneous tissues.  All extruded cement is removed and once the cement is hard the permanent 7 mm posterior stabilized rotating platform insert is placed into the tibial tray.      The wound is copiously irrigated with saline solution and the extensor mechanism closed with # 0 Stratofix suture. The tourniquet is released for a total tourniquet time of 33  minutes. Flexion against gravity is 140 degrees and the patella tracks normally. Subcutaneous tissue is closed with 2.0 vicryl and subcuticular with running 4.0 Monocryl. The incision is cleaned and dried and steri-strips and a bulky sterile dressing are applied. The limb is placed into a knee immobilizer and the patient is awakened and transported to recovery in stable condition.      Please note that a surgical assistant was a medical necessity for this procedure in order to perform it in a safe and expeditious manner. Surgical assistant was necessary to retract the ligaments and vital neurovascular structures to prevent injury to them and also necessary for proper positioning of the limb to allow for anatomic placement of the prosthesis.   Dempsey ROCKFORD Sharika Mosquera, MD    01/09/2024, 10:14 AM   "

## 2024-01-09 NOTE — Progress Notes (Signed)
 Orthopedic Tech Progress Note Patient Details:  Mario Burns 1945-08-02 [id number] Applied CPM per order. Will remove at 2:51 pm.  CPM Left Knee CPM Left Knee: On Left Knee Flexion (Degrees): 40 Left Knee Extension (Degrees): 10  Post Interventions Patient Tolerated: Well Instructions Provided: Adjustment of device, Care of device, Poper ambulation with device Ortho Devices Type of Ortho Device: CPM padding Ortho Device/Splint Location: LLE Ortho Device/Splint Interventions: Ordered, Application, Adjustment   Post Interventions Patient Tolerated: Well Instructions Provided: Adjustment of device, Care of device, Poper ambulation with device  [name] 01/09/2024, 10:54 AM

## 2024-01-09 NOTE — Transfer of Care (Signed)
"   Immediate Anesthesia Transfer of Care Note  Patient: Mario Burns  Procedure(s) Performed: ARTHROPLASTY, KNEE, TOTAL (Left: Knee)  Patient Location: PACU  Anesthesia Type:MAC, Regional, and Spinal  Level of Consciousness: awake, alert , oriented, and patient cooperative  Airway & Oxygen Therapy: Patient Spontanous Breathing and Patient connected to face mask oxygen  Post-op Assessment: Report given to RN and Post -op Vital signs reviewed and stable  Post vital signs: Reviewed and stable  Last Vitals:  Vitals Value Taken Time  BP 97 60   Temp    Pulse 66 01/09/24 10:35  Resp 18 01/09/24 10:35  SpO2 97 % 01/09/24 10:35  Vitals shown include unfiled device data.  Last Pain:  Vitals:   01/09/24 0729  TempSrc:   PainSc: 0-No pain         Complications: No notable events documented. "

## 2024-01-09 NOTE — Anesthesia Procedure Notes (Signed)
 Anesthesia Regional Block: Adductor canal block   Pre-Anesthetic Checklist: , timeout performed,  Correct Patient, Correct Site, Correct Laterality,  Correct Procedure, Correct Position, site marked,  Risks and benefits discussed,  Surgical consent,  Pre-op  evaluation,  At surgeon's request and post-op pain management  Laterality: Left  Prep: Maximum Sterile Barrier Precautions used, chloraprep       Needles:  Injection technique: Single-shot  Needle Type: Echogenic Needle      Needle Gauge: 20     Additional Needles:   Procedures:,,,, ultrasound used (permanent image in chart),,    Narrative:  Start time: 01/09/2024 8:19 AM End time: 01/09/2024 8:23 AM Injection made incrementally with aspirations every 5 mL.  Performed by: Personally  Anesthesiologist: Keneth Lynwood POUR, MD

## 2024-01-09 NOTE — Anesthesia Postprocedure Evaluation (Signed)
"   Anesthesia Post Note  Patient: Mario Burns  Procedure(s) Performed: ARTHROPLASTY, KNEE, TOTAL (Left: Knee)     Patient location during evaluation: PACU Anesthesia Type: MAC Level of consciousness: awake and alert Pain management: pain level controlled Vital Signs Assessment: post-procedure vital signs reviewed and stable Respiratory status: spontaneous breathing, nonlabored ventilation, respiratory function stable and patient connected to nasal cannula oxygen Cardiovascular status: stable and blood pressure returned to baseline Postop Assessment: no apparent nausea or vomiting Anesthetic complications: no   No notable events documented.  Last Vitals:  Vitals:   01/09/24 1245 01/09/24 1300  BP: 133/70 (!) 145/67  Pulse: 62 67  Resp: 13 15  Temp:  36.4 C  SpO2: 95% 98%    Last Pain:  Vitals:   01/09/24 1300  TempSrc:   PainSc: 4                  Lynwood MARLA Cornea      "

## 2024-01-09 NOTE — Discharge Instructions (Addendum)
 "   Dempsey Moan, MD Total Joint Specialist [facility] [address], [address] [place], [place] [address] [phone]  TOTAL KNEE REPLACEMENT POSTOPERATIVE DIRECTIONS    Knee Rehabilitation, Guidelines Following Surgery  Results after knee surgery are often greatly improved when you follow the exercise, range of motion and muscle strengthening exercises prescribed by your doctor. Safety measures are also important to protect the knee from further injury. If any of these exercises cause you to have increased pain or swelling in your knee joint, decrease the amount until you are comfortable again and slowly increase them. If you have problems or questions, call your caregiver or physical therapist for advice.   BLOOD CLOT PREVENTION Take a 10 mg Xarelto  once a day for three weeks following surgery. Then resume 81mg  Aspirin  once daily.  You may resume your vitamins/supplements once you have discontinued the Xarelto . Do not take any NSAIDs (Advil, Aleve, Ibuprofen, Meloxicam, etc.) until you have discontinued the Xarelto .    HOME CARE INSTRUCTIONS  Remove items at home which could result in a fall. This includes throw rugs or furniture in walking pathways.  ICE to the affected knee as much as tolerated. Icing helps control swelling. If the swelling is well controlled you will be more comfortable and rehab easier. Continue to use ice on the knee for pain and swelling from surgery. You may notice swelling that will progress down to the foot and ankle. This is normal after surgery. Elevate the leg when you are not up walking on it.    Continue to use the breathing machine which will help keep your temperature down. It is common for your temperature to cycle up and down following surgery, especially at night when you are not up moving around and exerting yourself. The breathing machine keeps your lungs expanded and your temperature down. Do not place pillow under the operative  knee, focus on keeping the knee straight while resting  DIET You may resume your previous home diet once you are discharged from the hospital.  DRESSING / WOUND CARE / SHOWERING Keep your bulky bandage on for 2 days. On the third post-operative day you may remove the Ace bandage and gauze. There is a waterproof adhesive bandage on your skin which will stay in place until your first follow-up appointment. Once you remove this you will not need to place another bandage You may begin showering 3 days following surgery, but do not submerge the incision under water .  ACTIVITY For the first 5 days, the key is rest and control of pain and swelling Do your home exercises twice a day starting on post-operative day 3. On the days you go to physical therapy, just do the home exercises once that day. You should rest, ice and elevate the leg for 50 minutes out of every hour. Get up and walk/stretch for 10 minutes per hour. After 5 days you can increase your activity slowly as tolerated. Walk with your walker as instructed. Use the walker until you are comfortable transitioning to a cane. Walk with the cane in the opposite hand of the operative leg. You may discontinue the cane once you are comfortable and walking steadily. Avoid periods of inactivity such as sitting longer than an hour when not asleep. This helps prevent blood clots.  You may discontinue the knee immobilizer once you are able to perform a straight leg raise while lying down. You may resume a sexual relationship in one month or when given the OK by  your doctor.  You may return to work once you are cleared by your doctor.  Do not drive a car for 6 weeks or until released by your surgeon.  Do not drive while taking narcotics.  TED HOSE STOCKINGS Wear the elastic stockings on both legs for three weeks following surgery during the day. You may remove them at night for sleeping.  WEIGHT BEARING Weight bearing as tolerated with assist device  (walker, cane, etc) as directed, use it as long as suggested by your surgeon or therapist, typically at least 4-6 weeks.  POSTOPERATIVE CONSTIPATION PROTOCOL Constipation - defined medically as fewer than three stools per week and severe constipation as less than one stool per week.  One of the most common issues patients have following surgery is constipation.  Even if you have a regular bowel pattern at home, your normal regimen is likely to be disrupted due to multiple reasons following surgery.  Combination of anesthesia, postoperative narcotics, change in appetite and fluid intake all can affect your bowels.  In order to avoid complications following surgery, here are some recommendations in order to help you during your recovery period.  Colace (docusate) - Pick up an over-the-counter form of Colace or another stool softener and take twice a day as long as you are requiring postoperative pain medications.  Take with a full glass of water  daily.  If you experience loose stools or diarrhea, hold the colace until you stool forms back up. If your symptoms do not get better within 1 week or if they get worse, check with your doctor. Dulcolax (bisacodyl ) - Pick up over-the-counter and take as directed by the product packaging as needed to assist with the movement of your bowels.  Take with a full glass of water .  Use this product as needed if not relieved by Colace only.  MiraLax  (polyethylene glycol) - Pick up over-the-counter to have on hand. MiraLax  is a solution that will increase the amount of water  in your bowels to assist with bowel movements.  Take as directed and can mix with a glass of water , juice, soda, coffee, or tea. Take if you go more than two days without a movement. Do not use MiraLax  more than once per day. Call your doctor if you are still constipated or irregular after using this medication for 7 days in a row.  If you continue to have problems with postoperative constipation, please  contact the office for further assistance and recommendations.  If you experience the worst abdominal pain ever or develop nausea or vomiting, please contact the office immediatly for further recommendations for treatment.  ITCHING If you experience itching with your medications, try taking only a single pain pill, or even half a pain pill at a time.  You can also use Benadryl  over the counter for itching or also to help with sleep.   MEDICATIONS See your medication summary on the After Visit Summary that the nursing staff will review with you prior to discharge.  You may have some home medications which will be placed on hold until you complete the course of blood thinner medication.  It is important for you to complete the blood thinner medication as prescribed by your surgeon.  Continue your approved medications as instructed at time of discharge.  PRECAUTIONS If you experience chest pain or shortness of breath - call 911 immediately for transfer to the hospital emergency department.  If you develop a fever greater that 101 F, purulent drainage from wound, increased redness  or drainage from wound, foul odor from the wound/dressing, or calf pain - CONTACT YOUR SURGEON.                                                   FOLLOW-UP APPOINTMENTS Make sure you keep all of your appointments after your operation with your surgeon and caregivers. You should call the office at the above phone number and make an appointment for approximately two weeks after the date of your surgery or on the date instructed by your surgeon outlined in the After Visit Summary.  RANGE OF MOTION AND STRENGTHENING EXERCISES  Rehabilitation of the knee is important following a knee injury or an operation. After just a few days of immobilization, the muscles of the thigh which control the knee become weakened and shrink (atrophy). Knee exercises are designed to build up the tone and strength of the thigh muscles and to  improve knee motion. Often times heat used for twenty to thirty minutes before working out will loosen up your tissues and help with improving the range of motion but do not use heat for the first two weeks following surgery. These exercises can be done on a training (exercise) mat, on the floor, on a table or on a bed. Use what ever works the best and is most comfortable for you Knee exercises include:  Leg Lifts - While your knee is still immobilized in a splint or cast, you can do straight leg raises. Lift the leg to 60 degrees, hold for 3 sec, and slowly lower the leg. Repeat 10-20 times 2-3 times daily. Perform this exercise against resistance later as your knee gets better.  Quad and Hamstring Sets - Tighten up the muscle on the front of the thigh (Quad) and hold for 5-10 sec. Repeat this 10-20 times hourly. Hamstring sets are done by pushing the foot backward against an object and holding for 5-10 sec. Repeat as with quad sets.  Leg Slides: Lying on your back, slowly slide your foot toward your buttocks, bending your knee up off the floor (only go as far as is comfortable). Then slowly slide your foot back down until your leg is flat on the floor again. Angel Wings: Lying on your back spread your legs to the side as far apart as you can without causing discomfort.  A rehabilitation program following serious knee injuries can speed recovery and prevent re-injury in the future due to weakened muscles. Contact your doctor or a physical therapist for more information on knee rehabilitation.   POST-OPERATIVE OPIOID TAPER INSTRUCTIONS: It is important to wean off of your opioid medication as soon as possible. If you do not need pain medication after your surgery it is ok to stop day one. Opioids include: Codeine, Hydrocodone (Norco, Vicodin), Oxycodone (Percocet, oxycontin ) and hydromorphone  amongst others.  Long term and even short term use of opiods can cause: Increased pain  response Dependence Constipation Depression Respiratory depression And more.  Withdrawal symptoms can include Flu like symptoms Nausea, vomiting And more Techniques to manage these symptoms Hydrate well Eat regular healthy meals Stay active Use relaxation techniques(deep breathing, meditating, yoga) Do Not substitute Alcohol to help with tapering If you have been on opioids for less than two weeks and do not have pain than it is ok to stop all together.  Plan to wean off of opioids This  plan should start within one week post op of your joint replacement. Maintain the same interval or time between taking each dose and first decrease the dose.  Cut the total daily intake of opioids by one tablet each day Next start to increase the time between doses. The last dose that should be eliminated is the evening dose.   IF YOU ARE TRANSFERRED TO A SKILLED REHAB FACILITY If the patient is transferred to a skilled rehab facility following release from the hospital, a list of the current medications will be sent to the facility for the patient to continue.  When discharged from the skilled rehab facility, please have the facility set up the patient's Home Health Physical Therapy prior to being released. Also, the skilled facility will be responsible for providing the patient with their medications at time of release from the facility to include their pain medication, the muscle relaxants, and their blood thinner medication. If the patient is still at the rehab facility at time of the two week follow up appointment, the skilled rehab facility will also need to assist the patient in arranging follow up appointment in our office and any transportation needs.  MAKE SURE YOU:  Understand these instructions.  Get help right away if you are not doing well or get worse.   DENTAL ANTIBIOTICS:  In most cases prophylactic antibiotics for Dental procdeures after total joint surgery are not  necessary.  Exceptions are as follows:  1. History of prior total joint infection  2. Severely immunocompromised (Organ Transplant, cancer chemotherapy, Rheumatoid biologic meds such as Humera)  3. Poorly controlled diabetes (A1C &gt; 8.0, blood glucose over 200)  If you have one of these conditions, contact your surgeon for an antibiotic prescription, prior to your dental procedure.    Pick up stool softner and laxative for home use following surgery while on pain medications. Do not submerge incision under water . Please use good hand washing techniques while changing dressing each day. May shower starting three days after surgery. Please use a clean towel to pat the incision dry following showers. Continue to use ice for pain and swelling after surgery. Do not use any lotions or creams on the incision until instructed by your surgeon.    Information on my medicine - XARELTO  (Rivaroxaban )  Why was Xarelto  prescribed for you? Xarelto  was prescribed for you to reduce the risk of blood clots forming after orthopedic surgery. The medical term for these abnormal blood clots is venous thromboembolism (VTE).  What do you need to know about xarelto  ? Take your Xarelto  ONCE DAILY at the same time every day. You may take it either with or without food.  If you have difficulty swallowing the tablet whole, you may crush it and mix in applesauce just prior to taking your dose.  Take Xarelto  exactly as prescribed by your doctor and DO NOT stop taking Xarelto  without talking to the doctor who prescribed the medication.  Stopping without other VTE prevention medication to take the place of Xarelto  may increase your risk of developing a clot.  After discharge, you should have regular check-up appointments with your healthcare provider that is prescribing your Xarelto .    What do you do if you miss a dose? If you miss a dose, take it as soon as you remember on the same day then  continue your regularly scheduled once daily regimen the next day. Do not take two doses of Xarelto  on the same day.   Important Safety Information A  possible side effect of Xarelto  is bleeding. You should call your healthcare provider right away if you experience any of the following: Bleeding from an injury or your nose that does not stop. Unusual colored urine (red or dark brown) or unusual colored stools (red or black). Unusual bruising for unknown reasons. A serious fall or if you hit your head (even if there is no bleeding).  Some medicines may interact with Xarelto  and might increase your risk of bleeding while on Xarelto . To help avoid this, consult your healthcare provider or pharmacist prior to using any new prescription or non-prescription medications, including herbals, vitamins, non-steroidal anti-inflammatory drugs (NSAIDs) and supplements.  This website has more information on Xarelto : www.xarelto .com.   "

## 2024-01-10 ENCOUNTER — Telehealth (HOSPITAL_COMMUNITY): Payer: Self-pay | Admitting: Pharmacy Technician

## 2024-01-10 ENCOUNTER — Other Ambulatory Visit (HOSPITAL_COMMUNITY): Payer: Self-pay

## 2024-01-10 DIAGNOSIS — M1712 Unilateral primary osteoarthritis, left knee: Secondary | ICD-10-CM | POA: Diagnosis not present

## 2024-01-10 LAB — BASIC METABOLIC PANEL WITH GFR
Anion gap: 8 (ref 5–15)
BUN: 22 mg/dL (ref 8–23)
CO2: 22 mmol/L (ref 22–32)
Calcium: 8.8 mg/dL — ABNORMAL LOW (ref 8.9–10.3)
Chloride: 105 mmol/L (ref 98–111)
Creatinine, Ser: 0.96 mg/dL (ref 0.61–1.24)
GFR, Estimated: >60 mL/min
Glucose, Bld: 225 mg/dL — ABNORMAL HIGH (ref 70–99)
Potassium: 5.4 mmol/L — ABNORMAL HIGH (ref 3.5–5.1)
Sodium: 135 mmol/L (ref 135–145)

## 2024-01-10 LAB — CBC
HCT: 31.1 % — ABNORMAL LOW (ref 39.0–52.0)
Hemoglobin: 10.8 g/dL — ABNORMAL LOW (ref 13.0–17.0)
MCH: 31.5 pg (ref 26.0–34.0)
MCHC: 34.7 g/dL (ref 30.0–36.0)
MCV: 90.7 fL (ref 80.0–100.0)
Platelets: 209 K/uL (ref 150–400)
RBC: 3.43 MIL/uL — ABNORMAL LOW (ref 4.22–5.81)
RDW: 13.6 % (ref 11.5–15.5)
WBC: 10.1 K/uL (ref 4.0–10.5)
nRBC: 0 % (ref 0.0–0.2)

## 2024-01-10 LAB — GLUCOSE, CAPILLARY
Glucose-Capillary: 162 mg/dL — ABNORMAL HIGH (ref 70–99)
Glucose-Capillary: 151 mg/dL — ABNORMAL HIGH (ref 70–99)

## 2024-01-10 MED ORDER — RIVAROXABAN 10 MG PO TABS: 10.0000 mg | ORAL_TABLET | Freq: Every day | ORAL | 0 refills | Status: AC

## 2024-01-10 MED ORDER — METHOCARBAMOL 500 MG PO TABS: 500.0000 mg | ORAL_TABLET | Freq: Four times a day (QID) | ORAL | 0 refills | Status: AC | PRN

## 2024-01-10 MED ORDER — OXYCODONE HCL 5 MG PO TABS: 5.0000 mg | ORAL_TABLET | ORAL | 0 refills | Status: AC | PRN

## 2024-01-10 MED ORDER — ONDANSETRON HCL 4 MG PO TABS: 4.0000 mg | ORAL_TABLET | Freq: Four times a day (QID) | ORAL | 0 refills | Status: AC | PRN

## 2024-01-10 MED ORDER — TRAMADOL HCL 50 MG PO TABS: 50.0000 mg | ORAL_TABLET | Freq: Four times a day (QID) | ORAL | 0 refills | Status: AC | PRN

## 2024-01-10 NOTE — Telephone Encounter (Signed)
 Patient Product/process Development Scientist completed.    The patient is insured through [employer]/ [employer]. Patient has Medicare and is not eligible for a copay card, but may be able to apply for patient assistance or Medicare RX Payment Plan (Patient Must reach out to their plan, if eligible for payment plan), if available.    Ran test claim for Xarelto  10 mg and the current 30 day co-pay is $51.59.   This test claim was processed through [facility]- copay amounts may vary at other pharmacies due to pharmacy/plan contracts, or as the patient moves through the different stages of their insurance plan.     Reyes Sharps, CPHT Pharmacy Technician Patient Advocate Specialist Lead [facility] Patient Advocate Team Direct Number: [phone]  Fax: [phone]

## 2024-01-10 NOTE — Progress Notes (Signed)
 "  Subjective: 1 Day Post-Op Procedures (LRB): ARTHROPLASTY, KNEE, TOTAL (Left) Patient reports pain as mild.   Patient seen in rounds by Dr. Melodi. Patient is well, and has had no acute complaints or problems No issues overnight. Denies chest pain, SOB, or calf pain. Foley catheter removed this AM.  We will continue therapy today, ambulated 45' yesterday.   Objective: Vital signs in last 24 hours: Temp:  [97.4 F (36.3 C)-97.8 F (36.6 C)] 97.8 F (36.6 C) (01/06 0500) Pulse Rate:  [45-67] 62 (01/06 0500) Resp:  [10-20] 18 (01/06 0500) BP: (77-145)/(41-71) 132/70 (01/06 0500) SpO2:  [93 %-100 %] 98 % (01/05 2242) Weight:  [78 kg] 78 kg (01/05 1426)  Intake/Output from previous day:  Intake/Output Summary (Last 24 hours) at 01/10/2024 0805 Last data filed at 01/10/2024 0620 Gross per 24 hour  Intake 2722.25 ml  Output 1745 ml  Net 977.25 ml     Intake/Output this shift: No intake/output data recorded.  Labs: Recent Labs    01/10/24 0344  HGB 10.8*   Recent Labs    01/10/24 0344  WBC 10.1  RBC 3.43*  HCT 31.1*  PLT 209   Recent Labs    01/10/24 0344  NA 135  K 5.4*  CL 105  CO2 22  BUN 22  CREATININE 0.96  GLUCOSE 225*  CALCIUM  8.8*   No results for input(s): LABPT, INR in the last 72 hours.  Exam: General - Patient is Alert and Oriented Extremity - Neurologically intact Neurovascular intact Sensation intact distally Dorsiflexion/Plantar flexion intact Dressing - dressing C/D/I Motor Function - intact, moving foot and toes well on exam.   Past Medical History:  Diagnosis Date   Acute prostatitis 03/03/2022   Adverse reaction to statin medication    Aortic atherosclerosis    Arthralgia of left knee 06/08/2023   Arthralgia of multiple joints 03/03/2022   Arthralgia of right knee 06/08/2023   Arthritis    At risk for falling 03/03/2022   B12 deficiency 03/03/2022   Benign enlargement of prostate 03/03/2022   Bladder neck contracture     Body mass index 29.0-29.9, adult 03/03/2022   Borderline hyperlipidemia    Breathlessness on exertion 03/03/2022   CA of prostate (HCC) 03/03/2022   urologist Dr. Alline, s/p radical prostatectomy   Chest pain 03/03/2022   Chronic right-sided low back pain with right-sided sciatica 04/26/2016   Claustrophobia    Complication of anesthesia    claustrophobic and slow to wake up   Coronary artery disease 06/29/2022   Degenerative joint disease of cervical spine    Diabetes mellitus (HCC) 12/16/2011   Diabetes with neurologic complications (HCC)    Diaphragmatic hernia 06/29/2022   Dizziness 03/03/2022   ED (erectile dysfunction) of organic origin 03/03/2022   Essential hypertension, benign 06/26/2014   Excessive urination at night 03/03/2022   Facial weakness 03/03/2022   Gastro-esophageal reflux disease with esophagitis 06/29/2022   Gonalgia 03/03/2022   H/O hiatal hernia    Headache    Hematochezia 06/29/2022   Hemorrhoids 06/29/2022   High risk medication use 01/08/2021   History of colonic polyps 03/03/2022   colonoscopy 07/01/10     History of thyroid  nodule    LARGE GOITER   Hypercholesterolemia    Hyperlipidemia 06/26/2014   Hypotestosteronism 03/03/2022   Hypothyroidism 06/29/2022   Hypothyroidism, postsurgical    Incisional hernia with obstruction 04/23/2013   Incisional hernia, without obstruction or gangrene 01/30/2013   Mixed incontinence    s/p prostatectomy  Muscle spasms of neck 03/03/2022   Myasthenia gravis (HCC) 01/08/2021   Obstructive sleep apnea 03/03/2022   intolerant of CPAP   Osteoarthritis of right knee    Pain in right hip 04/26/2016   Rectal bleeding 06/29/2022   Right hand paresthesia 11/2022   s/p dorsal lacertion between 1st & 2nd fingers   Right sided abdominal pain 03/03/2022   Shortness of breath    ALWAYS--HAD FOR YEARS - STATES HE CAN'T DO HARDLY ANYTHING WITHOUT GIVING OUT   Situational anxiety    Skin lesion 03/03/2022   Slow  transit constipation 06/29/2022   Statin intolerance    due to myasthenia gravis   Testosterone deficiency    Type 2 diabetes mellitus (HCC)    Weak urinary stream    Weakness 06/08/2021   Weight decreased 06/29/2022    Assessment/Plan: 1 Day Post-Op Procedures (LRB): ARTHROPLASTY, KNEE, TOTAL (Left) Principal Problem:   OA (osteoarthritis) of knee Active Problems:   Primary osteoarthritis of left knee  Estimated body mass index is 24.67 kg/m as calculated from the following:   Height as of this encounter: 5' 10 (1.778 m).   Weight as of this encounter: 78 kg. Advance diet Up with therapy D/C IV fluids   Patient's anticipated LOS is less than 2 midnights, meeting these requirements: - Lives within 1 hour of care - Has a competent adult at home to recover with post-op recover - NO history of  Chronic pain requiring opioids             - Heart failure             - Heart attack             - Stroke             - DVT/VTE             - Cardiac arrhythmia             - Respiratory Failure/COPD             - Renal failure             - Anemia             - Advanced Liver disease    DVT Prophylaxis - Xarelto  Weight bearing as tolerated. Continue therapy.  Plan is to go Home after hospital stay. Plan for discharge later today if progresses with therapy and meeting goals. [facility]uled [facility]). Follow-up in the office in 2 weeks.  The PDMP database was reviewed today prior to any opioid medications being prescribed to this patient.  Roxie Mess, PA-C Orthopedic Surgery [phone] 01/10/2024, 8:05 AM  "

## 2024-01-10 NOTE — Care Management Obs Status (Signed)
 MEDICARE OBSERVATION STATUS NOTIFICATION   Patient Details  Name: Mario Burns MRN: [id number] Date of Birth: Oct 30, 1945   Medicare Observation Status Notification Given:       Alfonse JONELLE Rex, RN 01/10/2024, 10:10 AM

## 2024-01-10 NOTE — Progress Notes (Signed)
 Reviewed written d/c instructions w pt and his wife, all questions answered and they both verbalized understanging. D/C via w/c w all belongings in stable condition.

## 2024-01-10 NOTE — TOC Transition Note (Addendum)
 Transition of Care [facility]) - Discharge Note   Patient Details  Name: Mario Burns MRN: [id number] Date of Birth: [birth year]  Transition of Care [facility]) CM/SW Contact:  Alfonse JONELLE Rex, RN Phone Number: 01/10/2024, 9:52 AM   Clinical Narrative:   Met with patient at bedside to review dc therapy and home equipment needs, pt confirmed OPPT- EO [place], has RW.  MOON completed. No INPT CM needs     Final next level of care: OP Rehab Barriers to Discharge: No Barriers Identified   Patient Goals and CMS Choice Patient states their goals for this hospitalization and ongoing recovery are:: return home          Discharge Placement                       Discharge Plan and Services Additional resources added to the After Visit Summary for                  DME Arranged: N/A DME Agency: NA                  Social Drivers of Health (SDOH) Interventions SDOH Screenings   Food Insecurity: No Food Insecurity (01/09/2024)  Housing: Low Risk (01/09/2024)  Transportation Needs: No Transportation Needs (01/09/2024)  Utilities: Not At Risk (01/09/2024)  Social Connections: Moderately Integrated (01/09/2024)  Tobacco Use: Low Risk (01/09/2024)     Readmission Risk Interventions     No data to display

## 2024-01-10 NOTE — Progress Notes (Signed)
 Physical Therapy Treatment Patient Details Name: Mario Burns MRN: [id number] DOB: 1945/06/09 Today's Date: 01/10/2024   History of Present Illness Pt is 79 yo male s/p L TKA on 01/09/24.  Pt with hx including but not limited to arthritis, claustrophobia, DDD cervical spine, DM, GERD, DM2, prostate CA, myasthenia gravis, HLD, back pain    PT Comments  Pt is POD # 1 and is progressing well. He has good pain control, ROM, and quad activation.  Pt ambulating 80' and performed steps similar to home set up. Pt with good understanding of HEP and has outpt PT later this week. Pt demonstrates safe gait & transfers in order to return home from PT perspective once discharged by MD.  While in hospital, will continue to benefit from PT for skilled therapy to advance mobility and exercises.       If plan is discharge home, recommend the following: A little help with walking and/or transfers;A little help with bathing/dressing/bathroom;Assistance with cooking/housework;Help with stairs or ramp for entrance   Can travel by private vehicle        Equipment Recommendations  None recommended by PT    Recommendations for Other Services       Precautions / Restrictions Precautions Precautions: Fall;Knee Knee Immobilizer - Left: Discontinue once straight leg raise with < 10 degree lag Restrictions LLE Weight Bearing Per Provider Order: Weight bearing as tolerated     Mobility  Bed Mobility Overal bed mobility: Needs Assistance Bed Mobility: Supine to Sit     Supine to sit: Supervision          Transfers Overall transfer level: Needs assistance Equipment used: Rolling walker (2 wheels) Transfers: Sit to/from Stand Sit to Stand: Supervision           General transfer comment: STS x 3 during session    Ambulation/Gait Ambulation/Gait assistance: Supervision Gait Distance (Feet): 80 Feet Assistive device: Rolling walker (2 wheels) Gait Pattern/deviations: Step-through pattern,  Decreased stride length, Decreased weight shift to left Gait velocity: decreased but functional     General Gait Details: Good RW proximity; educated on smaller steps if needed for pain control   Stairs Stairs: Yes Stairs assistance: Contact guard assist Stair Management: Step to pattern, Forwards Number of Stairs: 4 General stair comments: STarted bil rails progressed to 1 rail (pt can hold side of house/window sill) and cane; tolerated well   Wheelchair Mobility     Tilt Bed    Modified Rankin (Stroke Patients Only)       Balance Overall balance assessment: Needs assistance Sitting-balance support: No upper extremity supported Sitting balance-Leahy Scale: Good     Standing balance support: No upper extremity supported, Bilateral upper extremity supported Standing balance-Leahy Scale: Fair Standing balance comment: Steady wtih RW to ambulate; able to perform ADLs in stnading without UE support.                            Communication    Cognition Arousal: Alert Behavior During Therapy: WFL for tasks assessed/performed   PT - Cognitive impairments: No apparent impairments                                Cueing    Exercises Total Joint Exercises Ankle Circles/Pumps: AROM, Both, 10 reps, Supine Quad Sets: AROM, 10 reps, Supine Heel Slides: AAROM, Left, 5 reps, Supine Hip ABduction/ADduction: AAROM, Left, 5 reps, Supine  Long Arc Quad: AROM, Left, 5 reps, Seated Knee Flexion: AROM, Left, 10 reps, Seated Goniometric ROM: L knee 5 to 75 degrees    General Comments General comments (skin integrity, edema, etc.): VSS Educated on safe ice use, no pivots, car transfers, resting with leg straight, and TED hose during day. Also, encouraged walking every 1-2 hours during day. Educated on HEP with focus on mobility the first weeks. Discussed doing exercises within pain control and if pain increasing could decreased ROM, reps, and stop exercises as  needed. Encouraged to perform quad sets and ankle pumps frequently for blood flow and to promote full knee extension.  Pt very inquisitive and wanting to do things correct to progress.  Provided written instruction on above and increased time spent educating.  Pt very motivated and active (likely with potential to overdo) educated on focus on walking and exercises as able but if painful in first week could decrease.      Pertinent Vitals/Pain Pain Assessment Pain Assessment: 0-10 Pain Score: 5  Pain Location: L knee Pain Descriptors / Indicators: Burning, Aching Pain Intervention(s): Monitored during session, Premedicated before session, Repositioned, Patient requesting pain meds-RN notified, Limited activity within patient's tolerance    Home Living                          Prior Function            PT Goals (current goals can now be found in the care plan section) Progress towards PT goals: Progressing toward goals    Frequency    7X/week      PT Plan      Co-evaluation              AM-PAC PT 6 Clicks Mobility   Outcome Measure  Help needed turning from your back to your side while in a flat bed without using bedrails?: A Little Help needed moving from lying on your back to sitting on the side of a flat bed without using bedrails?: A Little Help needed moving to and from a bed to a chair (including a wheelchair)?: A Little Help needed standing up from a chair using your arms (e.g., wheelchair or bedside chair)?: A Little Help needed to walk in hospital room?: A Little Help needed climbing 3-5 steps with a railing? : A Little 6 Click Score: 18    End of Session Equipment Utilized During Treatment: Gait belt Activity Tolerance: Patient tolerated treatment well Patient left: with chair alarm set;in chair;with call bell/phone within reach Nurse Communication: Mobility status PT Visit Diagnosis: Other abnormalities of gait and mobility (R26.89);Muscle  weakness (generalized) (M62.81)     Time: 8993-8946 PT Time Calculation (min) (ACUTE ONLY): 47 min  Charges:    $Gait Training: 8-22 mins $Therapeutic Exercise: 8-22 mins $Therapeutic Activity: 8-22 mins PT General Charges $$ ACUTE PT VISIT: 1 Visit                     Benjiman, PT Acute Rehab [facility] [phone]    Benjiman VEAR Mulberry 01/10/2024, 11:35 AM

## 2024-01-12 ENCOUNTER — Encounter (HOSPITAL_COMMUNITY): Payer: Self-pay | Admitting: Orthopedic Surgery

## 2024-01-13 NOTE — Discharge Summary (Signed)
 Patient ID: Mario Burns MRN: [id number] DOB/AGE: March 01, 1945 79 y.o.  Admit date: 01/09/2024 Discharge date: 01/10/2024  Admission Diagnoses:  Principal Problem:   OA (osteoarthritis) of knee Active Problems:   Primary osteoarthritis of left knee   Discharge Diagnoses:  Same  Past Medical History:  Diagnosis Date   Acute prostatitis 03/03/2022   Adverse reaction to statin medication    Aortic atherosclerosis    Arthralgia of left knee 06/08/2023   Arthralgia of multiple joints 03/03/2022   Arthralgia of right knee 06/08/2023   Arthritis    At risk for falling 03/03/2022   B12 deficiency 03/03/2022   Benign enlargement of prostate 03/03/2022   Bladder neck contracture    Body mass index 29.0-29.9, adult 03/03/2022   Borderline hyperlipidemia    Breathlessness on exertion 03/03/2022   CA of prostate (HCC) 03/03/2022   urologist Dr. Alline, s/p radical prostatectomy   Chest pain 03/03/2022   Chronic right-sided low back pain with right-sided sciatica 04/26/2016   Claustrophobia    Complication of anesthesia    claustrophobic and slow to wake up   Coronary artery disease 06/29/2022   Degenerative joint disease of cervical spine    Diabetes mellitus (HCC) 12/16/2011   Diabetes with neurologic complications (HCC)    Diaphragmatic hernia 06/29/2022   Dizziness 03/03/2022   ED (erectile dysfunction) of organic origin 03/03/2022   Essential hypertension, benign 06/26/2014   Excessive urination at night 03/03/2022   Facial weakness 03/03/2022   Gastro-esophageal reflux disease with esophagitis 06/29/2022   Gonalgia 03/03/2022   H/O hiatal hernia    Headache    Hematochezia 06/29/2022   Hemorrhoids 06/29/2022   High risk medication use 01/08/2021   History of colonic polyps 03/03/2022   colonoscopy 07/01/10     History of thyroid  nodule    LARGE GOITER   Hypercholesterolemia    Hyperlipidemia 06/26/2014   Hypotestosteronism 03/03/2022   Hypothyroidism 06/29/2022    Hypothyroidism, postsurgical    Incisional hernia with obstruction 04/23/2013   Incisional hernia, without obstruction or gangrene 01/30/2013   Mixed incontinence    s/p prostatectomy   Muscle spasms of neck 03/03/2022   Myasthenia gravis (HCC) 01/08/2021   Obstructive sleep apnea 03/03/2022   intolerant of CPAP   Osteoarthritis of right knee    Pain in right hip 04/26/2016   Rectal bleeding 06/29/2022   Right hand paresthesia 11/2022   s/p dorsal lacertion between 1st & 2nd fingers   Right sided abdominal pain 03/03/2022   Shortness of breath    ALWAYS--HAD FOR YEARS - STATES HE CAN'T DO HARDLY ANYTHING WITHOUT GIVING OUT   Situational anxiety    Skin lesion 03/03/2022   Slow transit constipation 06/29/2022   Statin intolerance    due to myasthenia gravis   Testosterone deficiency    Type 2 diabetes mellitus (HCC)    Weak urinary stream    Weakness 06/08/2021   Weight decreased 06/29/2022    Surgeries: Procedures: ARTHROPLASTY, KNEE, TOTAL on 01/09/2024   Consultants:   Discharged Condition: Improved  Hospital Course: Mario Burns is an 79 y.o. male who was admitted 01/09/2024 for operative treatment ofOA (osteoarthritis) of knee. Patient has severe unremitting pain that affects sleep, daily activities, and work/hobbies. After pre-op  clearance the patient was taken to the operating room on 01/09/2024 and underwent  Procedures: ARTHROPLASTY, KNEE, TOTAL.    Patient was given perioperative antibiotics:  Anti-infectives (From admission, onward)    Start     Dose/Rate Route Frequency Ordered  Stop   01/09/24 1500  ceFAZolin  (ANCEF ) IVPB 2g/100 mL premix        2 g 200 mL/hr over 30 Minutes Intravenous Every 6 hours 01/09/24 1252 01/09/24 2042   01/09/24 0700  ceFAZolin  (ANCEF ) IVPB 2g/100 mL premix        2 g 200 mL/hr over 30 Minutes Intravenous On call to O.R. 01/09/24 0659 01/09/24 0930        Patient was given sequential compression devices, early ambulation, and  chemoprophylaxis to prevent DVT.  Patient benefited maximally from hospital stay and there were no complications.    Recent vital signs: No data found.   Recent laboratory studies: No results for input(s): WBC, HGB, HCT, PLT, NA, K, CL, CO2, BUN, CREATININE, GLUCOSE, INR, CALCIUM  in the last 72 hours.  Invalid input(s): PT, 2   Discharge Medications:   Allergies as of 01/10/2024       Reactions   Bydureon  [exenatide ] Nausea Only   Headache & constipation   Colestid [colestipol Hcl] Other (See Comments)   Bloating and constipation   Hydrochlorothiazide Other (See Comments)   Headache   Invokana [canagliflozin] Other (See Comments)   Polyuria   Januvia [sitagliptin] Other (See Comments)   Raised blood sugar   Levemir [insulin  Detemir] Other (See Comments)   Raised blood sugar   Lisinopril Other (See Comments)   Headache   Lovastatin Other (See Comments)   Blood in stool   Metformin  And Related Nausea Only   Headache   Nexletol  [bempedoic Acid ] Diarrhea   Upset stomach   Pioglitazone Other (See Comments)   Blood in stool   Pravastatin Sodium Other (See Comments)   Sore throat and Headache, felt like he couldn't breathe   Sulfa Antibiotics Other (See Comments)   unknown Other Reaction(s): stomach upset   Toujeo  Max Solostar [insulin  Glargine] Itching   Headaches and chest pain   Victoza  [liraglutide ] Nausea Only   Headache and constipation        Medication List     STOP taking these medications    aspirin  EC 81 MG tablet   naproxen sodium 220 MG tablet Commonly known as: ALEVE       TAKE these medications    acetaminophen  500 MG tablet Commonly known as: TYLENOL  Take 500 mg by mouth every 6 (six) hours as needed for mild pain or moderate pain.   amLODipine  5 MG tablet Commonly known as: NORVASC  Take 5 mg by mouth daily.   amLODipine  2.5 MG tablet Commonly known as: NORVASC  Take 1 tablet (2.5 mg total) by mouth  daily.   azaTHIOprine  50 MG tablet Commonly known as: IMURAN  Take 75 mg by mouth daily.   chlorhexidine  4 % external liquid Commonly known as: HIBICLENS  Apply 15 mLs (1 Application total) topically as directed for 30 doses. Use as directed daily for 5 days every other week for 6 weeks.   furosemide  20 MG tablet Commonly known as: LASIX  Take 20 mg by mouth daily as needed for fluid or edema.   glimepiride  4 MG tablet Commonly known as: AMARYL  Take 4 mg by mouth 2 (two) times daily.   HumaLOG  KwikPen 100 UNIT/ML KwikPen Generic drug: insulin  lispro Inject 5-10 Units into the skin 2 (two) times daily as needed (blood sugar of 150 & above or eating a heavy meal).   levothyroxine  175 MCG tablet Commonly known as: SYNTHROID  Take 175 mcg by mouth daily before breakfast.   losartan  100 MG tablet Commonly known as: COZAAR  Take  100 mg by mouth daily.   methocarbamol  500 MG tablet Commonly known as: ROBAXIN  Take 1 tablet (500 mg total) by mouth every 6 (six) hours as needed for muscle spasms.   mupirocin  ointment 2 % Commonly known as: BACTROBAN  Place 1 Application into the nose 2 (two) times daily for 60 doses. Use as directed 2 times daily for 5 days every other week for 6 weeks.   nitroGLYCERIN  0.4 MG SL tablet Commonly known as: NITROSTAT  Place 1 tablet (0.4 mg total) under the tongue every 5 (five) minutes as needed.   omeprazole 20 MG capsule Commonly known as: PRILOSEC Take 20 mg by mouth every evening.   ondansetron  4 MG tablet Commonly known as: ZOFRAN  Take 1 tablet (4 mg total) by mouth every 6 (six) hours as needed for nausea.   oxyCODONE  5 MG immediate release tablet Commonly known as: Oxy IR/ROXICODONE  Take 1 tablet (5 mg total) by mouth every 4 (four) hours as needed for severe pain (pain score 7-10).   pyridostigmine  60 MG tablet Commonly known as: MESTINON  Take 0.5 tablets (30 mg total) by mouth 3 (three) times daily.   rivaroxaban  10 MG Tabs  tablet Commonly known as: XARELTO  Take 1 tablet (10 mg total) by mouth daily with breakfast for 20 days. Then resume one 81 mg aspirin  once a day   rosuvastatin  5 MG tablet Commonly known as: CRESTOR  Take 5 mg by mouth every Monday, Wednesday, and Friday.   Toujeo  Max SoloStar 300 UNIT/ML Solostar Pen Generic drug: insulin  glargine (2 Unit Dial ) Inject 24-26 Units into the skin at bedtime. Increase until fasting glucose is 100-140   traMADol  50 MG tablet Commonly known as: ULTRAM  Take 1-2 tablets (50-100 mg total) by mouth every 6 (six) hours as needed for moderate pain (pain score 4-6).               Discharge Care Instructions  (From admission, onward)           Start     Ordered   01/10/24 0000  Weight bearing as tolerated        01/10/24 0810   01/10/24 0000  Change dressing       Comments: You may remove the bulky bandage (ACE wrap and gauze) two days after surgery. You will have an adhesive waterproof bandage underneath. Leave this in place until your first follow-up appointment.   01/10/24 0810            Diagnostic Studies: No results found.  Disposition: Discharge disposition: 01-Home or Self Care       Discharge Instructions     Call MD / Call 911   Complete by: As directed    If you experience chest pain or shortness of breath, CALL 911 and be transported to the hospital emergency room.  If you develope a fever above 101 F, pus (white drainage) or increased drainage or redness at the wound, or calf pain, call your surgeon's office.   Change dressing   Complete by: As directed    You may remove the bulky bandage (ACE wrap and gauze) two days after surgery. You will have an adhesive waterproof bandage underneath. Leave this in place until your first follow-up appointment.   Constipation Prevention   Complete by: As directed    Drink plenty of fluids.  Prune juice may be helpful.  You may use a stool softener, such as Colace (over the counter)  100 mg twice a day.  Use MiraLax  (over the  counter) for constipation as needed.   Diet - low sodium heart healthy   Complete by: As directed    Do not put a pillow under the knee. Place it under the heel.   Complete by: As directed    Driving restrictions   Complete by: As directed    No driving for two weeks   Post-operative opioid taper instructions:   Complete by: As directed    POST-OPERATIVE OPIOID TAPER INSTRUCTIONS: It is important to wean off of your opioid medication as soon as possible. If you do not need pain medication after your surgery it is ok to stop day one. Opioids include: Codeine, Hydrocodone (Norco, Vicodin), Oxycodone (Percocet, oxycontin ) and hydromorphone  amongst others.  Long term and even short term use of opiods can cause: Increased pain response Dependence Constipation Depression Respiratory depression And more.  Withdrawal symptoms can include Flu like symptoms Nausea, vomiting And more Techniques to manage these symptoms Hydrate well Eat regular healthy meals Stay active Use relaxation techniques(deep breathing, meditating, yoga) Do Not substitute Alcohol to help with tapering If you have been on opioids for less than two weeks and do not have pain than it is ok to stop all together.  Plan to wean off of opioids This plan should start within one week post op of your joint replacement. Maintain the same interval or time between taking each dose and first decrease the dose.  Cut the total daily intake of opioids by one tablet each day Next start to increase the time between doses. The last dose that should be eliminated is the evening dose.      TED hose   Complete by: As directed    Use stockings (TED hose) for three weeks on both leg(s).  You may remove them at night for sleeping.   Weight bearing as tolerated   Complete by: As directed         Follow-up Information     Melodi Lerner, MD. Go on 01/24/2024.   Specialty: Orthopedic  Surgery Why: You are scheduled for a post op appointment on Tuesday 01/24/24 at 1:45pm Contact information: [address] [place] [address] [phone]         [facility], [place]. Go on 01/12/2024.   Why: You are scheduled for physical therapy Thursday 01/12/24 at 3:20pm Contact information: [address] [place] [address] [phone]                  Signed: Roxie Mess 01/13/2024, 1:39 PM

## 2024-01-23 NOTE — Progress Notes (Signed)
 Mario Burns                                          MRN: [id number]   01/23/2024   The [facility] Quality Team Specialist reviewed this patient medical record for the purposes of chart review for care gap closure. The following were reviewed: chart review for care gap closure-controlling blood pressure.    [facility] Quality Team

## 2024-04-03 ENCOUNTER — Ambulatory Visit: Admitting: Neurology
